# Patient Record
Sex: Male | Born: 1970 | Race: White | Hispanic: No | Smoking: Current some day smoker
Health system: Southern US, Community
[De-identification: ages and names within clinical notes are randomized; demographics above are authoritative.]

## PROBLEM LIST (undated history)

## (undated) DIAGNOSIS — J189 Pneumonia, unspecified organism: Secondary | ICD-10-CM

## (undated) DIAGNOSIS — Z87442 Personal history of urinary calculi: Secondary | ICD-10-CM

## (undated) DIAGNOSIS — F329 Major depressive disorder, single episode, unspecified: Secondary | ICD-10-CM

## (undated) DIAGNOSIS — E119 Type 2 diabetes mellitus without complications: Secondary | ICD-10-CM

## (undated) DIAGNOSIS — K859 Acute pancreatitis without necrosis or infection, unspecified: Secondary | ICD-10-CM

## (undated) DIAGNOSIS — K219 Gastro-esophageal reflux disease without esophagitis: Secondary | ICD-10-CM

## (undated) DIAGNOSIS — G4730 Sleep apnea, unspecified: Secondary | ICD-10-CM

## (undated) DIAGNOSIS — F319 Bipolar disorder, unspecified: Secondary | ICD-10-CM

## (undated) DIAGNOSIS — F419 Anxiety disorder, unspecified: Secondary | ICD-10-CM

## (undated) DIAGNOSIS — E7800 Pure hypercholesterolemia, unspecified: Secondary | ICD-10-CM

## (undated) DIAGNOSIS — F32A Depression, unspecified: Secondary | ICD-10-CM

## (undated) DIAGNOSIS — F1020 Alcohol dependence, uncomplicated: Secondary | ICD-10-CM

## (undated) DIAGNOSIS — I10 Essential (primary) hypertension: Secondary | ICD-10-CM

## (undated) DIAGNOSIS — E1110 Type 2 diabetes mellitus with ketoacidosis without coma: Secondary | ICD-10-CM

## (undated) HISTORY — PX: LAPAROSCOPIC CHOLECYSTECTOMY: SUR755

## (undated) HISTORY — PX: CYSTOSCOPY W/ STONE MANIPULATION: SHX1427

---

## 2006-05-06 ENCOUNTER — Ambulatory Visit (HOSPITAL_COMMUNITY): Admission: RE | Admit: 2006-05-06 | Discharge: 2006-05-06 | Payer: Self-pay | Admitting: Cardiology

## 2006-07-20 ENCOUNTER — Ambulatory Visit: Payer: Self-pay | Admitting: Oncology

## 2006-08-30 ENCOUNTER — Ambulatory Visit: Payer: Self-pay | Admitting: Oncology

## 2009-05-31 ENCOUNTER — Emergency Department (HOSPITAL_BASED_OUTPATIENT_CLINIC_OR_DEPARTMENT_OTHER): Admission: EM | Admit: 2009-05-31 | Discharge: 2009-05-31 | Payer: Self-pay | Admitting: Emergency Medicine

## 2010-06-23 LAB — URINALYSIS, ROUTINE W REFLEX MICROSCOPIC
Ketones, ur: NEGATIVE mg/dL
Nitrite: NEGATIVE

## 2010-06-23 LAB — COMPREHENSIVE METABOLIC PANEL
ALT: 36 U/L (ref 0–53)
Alkaline Phosphatase: 69 U/L (ref 39–117)
Calcium: 9.5 mg/dL (ref 8.4–10.5)
Creatinine, Ser: 1 mg/dL (ref 0.4–1.5)

## 2010-06-23 LAB — CBC
HCT: 50.2 % (ref 39.0–52.0)
Hemoglobin: 17.6 g/dL — ABNORMAL HIGH (ref 13.0–17.0)
MCHC: 35.1 g/dL (ref 30.0–36.0)
RBC: 5.54 MIL/uL (ref 4.22–5.81)
RDW: 11.8 % (ref 11.5–15.5)
WBC: 13.4 10*3/uL — ABNORMAL HIGH (ref 4.0–10.5)

## 2010-06-23 LAB — POCT CARDIAC MARKERS: Myoglobin, poc: 61.1 ng/mL (ref 12–200)

## 2010-06-23 LAB — LIPASE, BLOOD: Lipase: 5217 U/L — ABNORMAL HIGH (ref 23–300)

## 2010-06-23 LAB — URINE MICROSCOPIC-ADD ON

## 2010-06-23 LAB — DIFFERENTIAL
Eosinophils Absolute: 0 10*3/uL (ref 0.0–0.7)
Lymphs Abs: 1.1 10*3/uL (ref 0.7–4.0)
Monocytes Absolute: 0.9 10*3/uL (ref 0.1–1.0)

## 2010-06-23 LAB — AMYLASE: Amylase: 481 U/L — ABNORMAL HIGH (ref 0–105)

## 2010-08-06 ENCOUNTER — Ambulatory Visit: Payer: Self-pay | Admitting: General Practice

## 2013-09-30 DIAGNOSIS — E039 Hypothyroidism, unspecified: Secondary | ICD-10-CM | POA: Insufficient documentation

## 2013-09-30 DIAGNOSIS — F32A Depression, unspecified: Secondary | ICD-10-CM | POA: Insufficient documentation

## 2013-09-30 DIAGNOSIS — E781 Pure hyperglyceridemia: Secondary | ICD-10-CM | POA: Insufficient documentation

## 2013-09-30 DIAGNOSIS — G4700 Insomnia, unspecified: Secondary | ICD-10-CM | POA: Insufficient documentation

## 2013-09-30 DIAGNOSIS — K811 Chronic cholecystitis: Secondary | ICD-10-CM | POA: Insufficient documentation

## 2013-09-30 DIAGNOSIS — K859 Acute pancreatitis without necrosis or infection, unspecified: Secondary | ICD-10-CM | POA: Insufficient documentation

## 2013-09-30 DIAGNOSIS — K863 Pseudocyst of pancreas: Secondary | ICD-10-CM | POA: Insufficient documentation

## 2013-09-30 DIAGNOSIS — Z794 Long term (current) use of insulin: Secondary | ICD-10-CM

## 2013-09-30 DIAGNOSIS — F419 Anxiety disorder, unspecified: Secondary | ICD-10-CM | POA: Insufficient documentation

## 2013-09-30 DIAGNOSIS — E1165 Type 2 diabetes mellitus with hyperglycemia: Secondary | ICD-10-CM

## 2013-09-30 DIAGNOSIS — F329 Major depressive disorder, single episode, unspecified: Secondary | ICD-10-CM | POA: Insufficient documentation

## 2013-09-30 DIAGNOSIS — IMO0002 Reserved for concepts with insufficient information to code with codable children: Secondary | ICD-10-CM | POA: Insufficient documentation

## 2013-09-30 DIAGNOSIS — E559 Vitamin D deficiency, unspecified: Secondary | ICD-10-CM | POA: Insufficient documentation

## 2013-09-30 DIAGNOSIS — J309 Allergic rhinitis, unspecified: Secondary | ICD-10-CM | POA: Insufficient documentation

## 2013-09-30 DIAGNOSIS — G4733 Obstructive sleep apnea (adult) (pediatric): Secondary | ICD-10-CM | POA: Insufficient documentation

## 2013-09-30 DIAGNOSIS — E7800 Pure hypercholesterolemia, unspecified: Secondary | ICD-10-CM | POA: Insufficient documentation

## 2013-09-30 DIAGNOSIS — E1142 Type 2 diabetes mellitus with diabetic polyneuropathy: Secondary | ICD-10-CM | POA: Insufficient documentation

## 2013-09-30 DIAGNOSIS — I10 Essential (primary) hypertension: Secondary | ICD-10-CM | POA: Insufficient documentation

## 2013-09-30 DIAGNOSIS — R7989 Other specified abnormal findings of blood chemistry: Secondary | ICD-10-CM | POA: Insufficient documentation

## 2013-09-30 DIAGNOSIS — F1011 Alcohol abuse, in remission: Secondary | ICD-10-CM | POA: Insufficient documentation

## 2013-11-21 DIAGNOSIS — K760 Fatty (change of) liver, not elsewhere classified: Secondary | ICD-10-CM | POA: Insufficient documentation

## 2015-05-22 DIAGNOSIS — F331 Major depressive disorder, recurrent, moderate: Secondary | ICD-10-CM | POA: Insufficient documentation

## 2015-05-22 DIAGNOSIS — F410 Panic disorder [episodic paroxysmal anxiety] without agoraphobia: Secondary | ICD-10-CM | POA: Insufficient documentation

## 2015-11-25 ENCOUNTER — Other Ambulatory Visit: Payer: Self-pay | Admitting: Family Medicine

## 2015-11-26 LAB — CMP12+LP+TP+TSH+6AC+PSA+CBC…
ALBUMIN: 4 g/dL (ref 3.5–5.5)
ALT: 57 IU/L — AB (ref 0–44)
AST: 129 IU/L — AB (ref 0–40)
Albumin/Globulin Ratio: 1.6 (ref 1.2–2.2)
Alkaline Phosphatase: 151 IU/L — ABNORMAL HIGH (ref 39–117)
BASOS ABS: 0 10*3/uL (ref 0.0–0.2)
BILIRUBIN TOTAL: 0.9 mg/dL (ref 0.0–1.2)
BUN / CREAT RATIO: 7 — AB (ref 9–20)
BUN: 6 mg/dL (ref 6–24)
Basos: 0 %
CHLORIDE: 79 mmol/L — AB (ref 96–106)
CHOLESTEROL TOTAL: 184 mg/dL (ref 100–199)
CREATININE: 0.82 mg/dL (ref 0.76–1.27)
Calcium: 8.2 mg/dL — ABNORMAL LOW (ref 8.7–10.2)
Chol/HDL Ratio: 5 ratio units (ref 0.0–5.0)
EOS (ABSOLUTE): 0 10*3/uL (ref 0.0–0.4)
ESTIMATED CHD RISK: 1 times avg. (ref 0.0–1.0)
Eos: 0 %
FREE THYROXINE INDEX: 1.3 (ref 1.2–4.9)
GFR, EST AFRICAN AMERICAN: 122 mL/min/{1.73_m2} (ref >59)
GFR, EST NON AFRICAN AMERICAN: 105 mL/min/{1.73_m2} (ref >59)
GGT: 1051 IU/L — AB (ref 0–65)
GLUCOSE: 792 mg/dL — AB (ref 65–99)
Globulin, Total: 2.5 g/dL (ref 1.5–4.5)
HDL: 37 mg/dL — ABNORMAL LOW (ref >39)
Hematocrit: 45.4 % (ref 37.5–51.0)
Hemoglobin: 15.2 g/dL (ref 12.6–17.7)
IRON: 158 ug/dL (ref 38–169)
Immature Grans (Abs): 0 10*3/uL (ref 0.0–0.1)
Immature Granulocytes: 0 %
LDH: 283 IU/L — ABNORMAL HIGH (ref 121–224)
LYMPHS ABS: 1 10*3/uL (ref 0.7–3.1)
Lymphs: 20 %
MCH: 33.9 pg — AB (ref 26.6–33.0)
MCHC: 33.5 g/dL (ref 31.5–35.7)
MCV: 101 fL — AB (ref 79–97)
MONOCYTES: 6 %
Monocytes Absolute: 0.3 10*3/uL (ref 0.1–0.9)
NEUTROS ABS: 3.6 10*3/uL (ref 1.4–7.0)
Neutrophils: 74 %
PHOSPHORUS: 3.9 mg/dL (ref 2.5–4.5)
PLATELETS: 124 10*3/uL — AB (ref 150–379)
POTASSIUM: 3.7 mmol/L (ref 3.5–5.2)
Prostate Specific Ag, Serum: 2.4 ng/mL (ref 0.0–4.0)
RBC: 4.48 x10E6/uL (ref 4.14–5.80)
RDW: 12.5 % (ref 12.3–15.4)
Sodium: 131 mmol/L — ABNORMAL LOW (ref 134–144)
T3 UPTAKE RATIO: 24 % (ref 24–39)
T4 TOTAL: 5.4 ug/dL (ref 4.5–12.0)
TOTAL PROTEIN: 6.5 g/dL (ref 6.0–8.5)
TSH: 2.44 u[IU]/mL (ref 0.450–4.500)
Triglycerides: 642 mg/dL (ref 0–149)
URIC ACID: 4.8 mg/dL (ref 3.7–8.6)
WBC: 5 10*3/uL (ref 3.4–10.8)

## 2015-11-26 LAB — HGB A1C W/O EAG: HEMOGLOBIN A1C: 12 % — AB (ref 4.8–5.6)

## 2016-05-21 DIAGNOSIS — G8929 Other chronic pain: Secondary | ICD-10-CM | POA: Insufficient documentation

## 2016-05-21 DIAGNOSIS — E1142 Type 2 diabetes mellitus with diabetic polyneuropathy: Secondary | ICD-10-CM | POA: Insufficient documentation

## 2016-05-21 DIAGNOSIS — M25562 Pain in left knee: Secondary | ICD-10-CM

## 2016-05-21 DIAGNOSIS — M25561 Pain in right knee: Secondary | ICD-10-CM

## 2016-05-27 DIAGNOSIS — R739 Hyperglycemia, unspecified: Secondary | ICD-10-CM | POA: Insufficient documentation

## 2016-05-27 DIAGNOSIS — R202 Paresthesia of skin: Secondary | ICD-10-CM

## 2016-05-27 DIAGNOSIS — R200 Anesthesia of skin: Secondary | ICD-10-CM | POA: Insufficient documentation

## 2016-05-27 DIAGNOSIS — R262 Difficulty in walking, not elsewhere classified: Secondary | ICD-10-CM | POA: Insufficient documentation

## 2016-05-28 DIAGNOSIS — G959 Disease of spinal cord, unspecified: Secondary | ICD-10-CM | POA: Insufficient documentation

## 2016-06-04 ENCOUNTER — Other Ambulatory Visit: Payer: Self-pay | Admitting: Registered Nurse

## 2016-06-04 ENCOUNTER — Encounter: Payer: Self-pay | Admitting: Registered Nurse

## 2016-07-23 ENCOUNTER — Ambulatory Visit: Payer: Self-pay | Admitting: *Deleted

## 2016-07-23 VITALS — BP 106/78 | HR 101

## 2016-07-23 DIAGNOSIS — E118 Type 2 diabetes mellitus with unspecified complications: Principal | ICD-10-CM

## 2016-07-23 DIAGNOSIS — Z794 Long term (current) use of insulin: Principal | ICD-10-CM

## 2016-07-23 DIAGNOSIS — E1165 Type 2 diabetes mellitus with hyperglycemia: Secondary | ICD-10-CM

## 2016-07-23 DIAGNOSIS — IMO0002 Reserved for concepts with insufficient information to code with codable children: Secondary | ICD-10-CM

## 2016-07-23 NOTE — Progress Notes (Signed)
Pt presented to clinic reporting he was pushed to come by coworkers that felt he was walking off balance. Pt reports he has felt dizzy, worse with bending over, or getting in and out of the shower, for past few weeks. Then sts"really almost since I got out of the hospital" d/c date was 05/30/16. Sts he has also had blurred vision and n/v intermittently. Every few days will have multiple episodes of vomiting. Unable to report what his CBG is at these times because "my hands are too shaky to check." blood sugar 150 at work yesterday afternoon where he reports "I think I passed out at my desk at that time. I came to with my head hitting my keyboard." No witnesses, unknown duration of LOC. This am was 374 and wants RN to check glucometer for accuracy due to this variance. CBG checked with his monitor and clinic monitor from same sample site back to back. 249 on clinic, 270 on pt's. Pt reports usually asymptomatic when CBGs are in 200's. BP WNL. Pulse slightly elevated but regular, +2. Encouraged pt to call his MD office to discuss these symptoms and develop and plan of care. Pt is agreeable to plan.

## 2016-08-27 ENCOUNTER — Encounter (HOSPITAL_COMMUNITY): Payer: Self-pay

## 2016-08-27 ENCOUNTER — Emergency Department (HOSPITAL_COMMUNITY): Payer: No Typology Code available for payment source

## 2016-08-27 ENCOUNTER — Emergency Department (HOSPITAL_COMMUNITY)
Admission: EM | Admit: 2016-08-27 | Discharge: 2016-08-27 | Disposition: A | Discharge status: Home / Self Care | Payer: No Typology Code available for payment source | Attending: Emergency Medicine | Admitting: Emergency Medicine

## 2016-08-27 DIAGNOSIS — E039 Hypothyroidism, unspecified: Secondary | ICD-10-CM | POA: Diagnosis not present

## 2016-08-27 DIAGNOSIS — Z794 Long term (current) use of insulin: Secondary | ICD-10-CM | POA: Diagnosis not present

## 2016-08-27 DIAGNOSIS — I10 Essential (primary) hypertension: Secondary | ICD-10-CM | POA: Diagnosis not present

## 2016-08-27 DIAGNOSIS — F17200 Nicotine dependence, unspecified, uncomplicated: Secondary | ICD-10-CM | POA: Diagnosis not present

## 2016-08-27 DIAGNOSIS — E1140 Type 2 diabetes mellitus with diabetic neuropathy, unspecified: Secondary | ICD-10-CM | POA: Diagnosis present

## 2016-08-27 DIAGNOSIS — R109 Unspecified abdominal pain: Secondary | ICD-10-CM | POA: Insufficient documentation

## 2016-08-27 DIAGNOSIS — R42 Dizziness and giddiness: Secondary | ICD-10-CM | POA: Insufficient documentation

## 2016-08-27 DIAGNOSIS — G629 Polyneuropathy, unspecified: Secondary | ICD-10-CM

## 2016-08-27 LAB — BASIC METABOLIC PANEL
Anion gap: 20 — ABNORMAL HIGH (ref 5–15)
CALCIUM: 8.7 mg/dL — AB (ref 8.9–10.3)
CO2: 27 mmol/L (ref 22–32)
CREATININE: 0.72 mg/dL (ref 0.61–1.24)
Chloride: 94 mmol/L — ABNORMAL LOW (ref 101–111)
GFR calc Af Amer: >60 mL/min (ref >60)
GFR calc non Af Amer: >60 mL/min (ref >60)
GLUCOSE: 370 mg/dL — AB (ref 65–99)
Potassium: 2.9 mmol/L — ABNORMAL LOW (ref 3.5–5.1)
Sodium: 141 mmol/L (ref 135–145)

## 2016-08-27 LAB — URINALYSIS, MICROSCOPIC (REFLEX)

## 2016-08-27 LAB — URINALYSIS, ROUTINE W REFLEX MICROSCOPIC
Glucose, UA: >=500 mg/dL — AB
Ketones, ur: 15 mg/dL — AB
Leukocytes, UA: NEGATIVE
Nitrite: NEGATIVE
PROTEIN: 100 mg/dL — AB
pH: 6 (ref 5.0–8.0)

## 2016-08-27 LAB — HEPATIC FUNCTION PANEL
ALK PHOS: 96 U/L (ref 38–126)
ALT: 56 U/L (ref 17–63)
AST: 113 U/L — ABNORMAL HIGH (ref 15–41)
Albumin: 4.1 g/dL (ref 3.5–5.0)
BILIRUBIN INDIRECT: 0.5 mg/dL (ref 0.3–0.9)
Bilirubin, Direct: 0.3 mg/dL (ref 0.1–0.5)
Total Bilirubin: 0.8 mg/dL (ref 0.3–1.2)
Total Protein: 7 g/dL (ref 6.5–8.1)

## 2016-08-27 LAB — CBG MONITORING, ED
GLUCOSE-CAPILLARY: 350 mg/dL — AB (ref 65–99)
Glucose-Capillary: 279 mg/dL — ABNORMAL HIGH (ref 65–99)

## 2016-08-27 LAB — CBC
HCT: 44.5 % (ref 39.0–52.0)
Hemoglobin: 16.1 g/dL (ref 13.0–17.0)
MCH: 32.3 pg (ref 26.0–34.0)
MCHC: 36.2 g/dL — AB (ref 30.0–36.0)
MCV: 89.4 fL (ref 78.0–100.0)
PLATELETS: 130 10*3/uL — AB (ref 150–400)
RBC: 4.98 MIL/uL (ref 4.22–5.81)
RDW: 11.9 % (ref 11.5–15.5)
WBC: 5.2 10*3/uL (ref 4.0–10.5)

## 2016-08-27 MED ORDER — POTASSIUM CHLORIDE CRYS ER 20 MEQ PO TBCR
40.0000 meq | EXTENDED_RELEASE_TABLET | Freq: Once | ORAL | Status: AC
Start: 2016-08-27 — End: 2016-08-27
  Administered 2016-08-27: 40 meq via ORAL
  Filled 2016-08-27: qty 2

## 2016-08-27 MED ORDER — LORAZEPAM 0.5 MG PO TABS: 1.0000 mg | ORAL_TABLET | Freq: Three times a day (TID) | ORAL | 0 refills | Status: DC | PRN

## 2016-08-27 MED ORDER — HYDROMORPHONE HCL 1 MG/ML IJ SOLN
1.0000 mg | Freq: Once | INTRAMUSCULAR | Status: AC
  Administered 2016-08-27: 1 mg via INTRAVENOUS
  Filled 2016-08-27: qty 1

## 2016-08-27 MED ORDER — SODIUM CHLORIDE 0.9 % IV BOLUS (SEPSIS)
1000.0000 mL | Freq: Once | INTRAVENOUS | Status: AC
  Administered 2016-08-27: 1000 mL via INTRAVENOUS

## 2016-08-27 MED ORDER — POTASSIUM CHLORIDE ER 10 MEQ PO TBCR: 10.0000 meq | EXTENDED_RELEASE_TABLET | Freq: Every day | ORAL | 0 refills | Status: DC

## 2016-08-27 MED ORDER — ONDANSETRON HCL 4 MG/2ML IJ SOLN
4.0000 mg | Freq: Once | INTRAMUSCULAR | Status: AC
  Administered 2016-08-27: 4 mg via INTRAVENOUS
  Filled 2016-08-27: qty 2

## 2016-08-27 MED ORDER — POTASSIUM CHLORIDE 10 MEQ/100ML IV SOLN
10.0000 meq | Freq: Once | INTRAVENOUS | Status: AC
  Administered 2016-08-27: 10 meq via INTRAVENOUS
  Filled 2016-08-27: qty 100

## 2016-08-27 MED ORDER — LORAZEPAM 0.5 MG PO TABS: 0.5000 mg | ORAL_TABLET | Freq: Three times a day (TID) | ORAL | 0 refills | Status: DC | PRN

## 2016-08-27 NOTE — ED Provider Notes (Signed)
[facility]-EMERGENCY DEPT Provider Note   CSN: [id number] Arrival date & time: 08/27/16  4098     History   Chief Complaint Chief Complaint  Patient presents with  . Peripheral Neuropathy    HPI ADEM COSTLOW is a 46 y.o. male.  Patient states that he's had periodic shaking recently.    Illness  This is a new problem. The current episode started more than 2 days ago. The problem occurs daily. The problem has not changed since onset.Pertinent negatives include no chest pain, no abdominal pain and no headaches. Nothing aggravates the symptoms. Nothing relieves the symptoms. He has tried nothing for the symptoms. The treatment provided no relief.    Past Medical History:  Diagnosis Date  . Diabetes mellitus without complication [facility])     Patient Active Problem List   Diagnosis Date Noted  . Moderate episode of recurrent major depressive disorder (HCC) 05/22/2015  . Panic disorder 05/22/2015  . Hepatic steatosis 11/21/2013  . Nondependent alcohol abuse, in remission 09/30/2013  . Allergic rhinitis 09/30/2013  . Anxiety disorder 09/30/2013  . Chronic cholecystitis 09/30/2013  . Disorder of magnesium metabolism 09/30/2013  . Essential hypertension 09/30/2013  . Familial hypertriglyceridemia 09/30/2013  . Hypercholesterolemia 09/30/2013  . Hypothyroidism 09/30/2013  . Insomnia 09/30/2013  . Low testosterone 09/30/2013  . OSA (obstructive sleep apnea) 09/30/2013  . Pancreatic pseudocyst 09/30/2013  . Depressive disorder 09/30/2013  . Recurrent pancreatitis (HCC) 09/30/2013  . Uncontrolled type 2 diabetes mellitus without complication, with long-term current use of insulin (HCC) 09/30/2013  . Vitamin D deficiency 09/30/2013    History reviewed. No pertinent surgical history.     Home Medications    Prior to Admission medications   Medication Sig Start Date End Date Taking? Authorizing Provider  atorvastatin (LIPITOR) 10 MG tablet Take 10 mg by mouth daily. 01/23/14   Yes [provider]  Blood Glucose Monitoring Suppl (GLUCOCOM BLOOD GLUCOSE MONITOR) DEVI Apply 1 strip topically 2 (two) times daily as needed. 11/21/13  Yes [provider]  canagliflozin (INVOKANA) 100 MG TABS tablet Take 100 mg by mouth daily. 08/20/14  Yes [provider]  carvedilol (COREG) 12.5 MG tablet Take 12.5 mg by mouth 2 (two) times daily. 07/11/15  Yes [provider]  clonazePAM (KLONOPIN) 1 MG tablet Take 1 mg by mouth 2 (two) times daily as needed. 12/30/15 12/29/16 Yes [provider]  DULoxetine (CYMBALTA) 30 MG capsule Take 30 mg by mouth 2 (two) times daily. 01/17/16 01/16/17 Yes [provider]  fenofibrate 160 MG tablet Take 160 mg by mouth. 06/04/16  Yes [provider]  gabapentin (NEURONTIN) 300 MG capsule Take 600 mg by mouth 3 (three) times daily.  05/30/16  Yes [provider]  glipiZIDE (GLUCOTROL) 10 MG tablet Take 10 mg by mouth. 05/30/16  Yes [provider]  glucose blood (BAYER CONTOUR TEST) test strip Apply 1 strip topically 3 (three) times daily as needed. 04/10/14  Yes [provider]  hydrOXYzine (VISTARIL) 50 MG capsule Take 50 mg by mouth 2 (two) times daily as needed. 07/11/15  Yes [provider]  Insulin Glargine (LANTUS SOLOSTAR) 100 UNIT/ML Solostar Pen Inject 38 units subcutaneously daily or as directed for diabetes E11.9 06/11/16  Yes [provider]  levothyroxine (SYNTHROID, LEVOTHROID) 112 MCG tablet Take 112 mcg by mouth daily. 05/31/15  Yes [provider]  magnesium oxide (MAG-OX) 400 MG tablet Take 400 mg by mouth daily. Prescribed yesterday have not picked it up yet 08/17/16  08/17/17 Yes [provider]  omeprazole (PRILOSEC) 20 MG capsule Take 20 mg by mouth daily. 07/23/14  Yes [provider]  ondansetron (ZOFRAN) 4 MG tablet Take 4 mg by mouth every 6 (six) hours as needed. 07/23/14  Yes [provider]  traZODone  (DESYREL) 50 MG tablet Take 50 mg by mouth at bedtime as needed.  07/11/15  Yes [provider]  LORazepam (ATIVAN) 0.5 MG tablet Take 2 tablets (1 mg total) by mouth every 8 (eight) hours as needed (shaking). 08/27/16   Bethann Berkshire, MD  LORazepam (ATIVAN) 0.5 MG tablet Take 1 tablet (0.5 mg total) by mouth every 8 (eight) hours as needed (shaking). 08/27/16   Bethann Berkshire, MD  potassium chloride (K-DUR) 10 MEQ tablet Take 1 tablet (10 mEq total) by mouth daily. 08/27/16   Bethann Berkshire, MD    Family History History reviewed. No pertinent family history.  Social History Social History  Substance Use Topics  . Smoking status: Current Some Day Smoker  . Smokeless tobacco: Never Used  . Alcohol use No     Allergies   Morphine; Penicillins; Amoxicillin; and Meperidine   Review of Systems Review of Systems  Constitutional: Negative for appetite change and fatigue.  HENT: Negative for congestion, ear discharge and sinus pressure.   Eyes: Negative for discharge.  Respiratory: Negative for cough.   Cardiovascular: Negative for chest pain.  Gastrointestinal: Negative for abdominal pain and diarrhea.  Genitourinary: Negative for frequency and hematuria.  Musculoskeletal: Negative for back pain.  Skin: Negative for rash.  Neurological: Negative for seizures and headaches.       Tremor  Psychiatric/Behavioral: Negative for hallucinations.     Physical Exam Updated Vital Signs BP (!) 157/98   Pulse 88   Temp 98.2 F (36.8 C) (Oral)   Resp 16   Ht 5\' 7"  (1.702 m)   Wt 63.5 kg (140 lb)   SpO2 90%   BMI 21.93 kg/m   Physical Exam  Constitutional: He is oriented to person, place, and time. He appears well-developed.  HENT:  Head: Normocephalic.  Eyes: Conjunctivae and EOM are normal. No scleral icterus.  Neck: Neck supple. No thyromegaly present.  Cardiovascular: Normal rate and regular rhythm.  Exam reveals no gallop and no friction rub.   No murmur  heard. Pulmonary/Chest: No stridor. He has no wheezes. He has no rales. He exhibits no tenderness.  Abdominal: He exhibits no distension. There is no tenderness. There is no rebound.  Musculoskeletal: Normal range of motion. He exhibits no edema.  Lymphadenopathy:    He has no cervical adenopathy.  Neurological: He is oriented to person, place, and time. He exhibits normal muscle tone. Coordination normal.  Mild tremor.  Skin: No rash noted. No erythema.  Psychiatric: He has a normal mood and affect. His behavior is normal.     ED Treatments / Results  Labs (all labs ordered are listed, but only abnormal results are displayed) Labs Reviewed  BASIC METABOLIC PANEL - Abnormal; Notable for the following:       Result Value   Potassium 2.9 (*)    Chloride 94 (*)    Glucose, Bld 370 (*)    BUN <5 (*)    Calcium 8.7 (*)    Anion gap 20 (*)    All other components within normal limits  CBC - Abnormal; Notable for the following:    MCHC 36.2 (*)    Platelets 130 (*)    All other components within  normal limits  URINALYSIS, ROUTINE W REFLEX MICROSCOPIC - Abnormal; Notable for the following:    APPearance CLOUDY (*)    Specific Gravity, Urine >1.030 (*)    Glucose, UA >=500 (*)    Hgb urine dipstick LARGE (*)    Bilirubin Urine SMALL (*)    Ketones, ur 15 (*)    Protein, ur 100 (*)    All other components within normal limits  URINALYSIS, MICROSCOPIC (REFLEX) - Abnormal; Notable for the following:    Bacteria, UA FEW (*)    Squamous Epithelial / LPF 0-5 (*)    All other components within normal limits  HEPATIC FUNCTION PANEL - Abnormal; Notable for the following:    AST 113 (*)    All other components within normal limits  CBG MONITORING, ED - Abnormal; Notable for the following:    Glucose-Capillary 350 (*)    All other components within normal limits  CBG MONITORING, ED - Abnormal; Notable for the following:    Glucose-Capillary 279 (*)    All other components within normal  limits    EKG  EKG Interpretation None       Radiology Ct Head Wo Contrast  Result Date: 08/27/2016 CLINICAL DATA:  Weakness and dizziness today. EXAM: CT HEAD WITHOUT CONTRAST TECHNIQUE: Contiguous axial images were obtained from the base of the skull through the vertex without intravenous contrast. COMPARISON:  Head CT scan 05/27/2016. FINDINGS: Brain: No acute abnormality including hemorrhage, infarct, mass lesion, mass effect, midline shift or abnormal extra-axial fluid collection. No hydrocephalus or pneumocephalus. Vascular: Negative. Skull: Intact. Sinuses/Orbits: Negative. Other: None. IMPRESSION: Negative head CT. Electronically Signed   By: Drusilla Kannerhomas  Dalessio M.D.   On: 08/27/2016 13:13   Ct Renal Stone Study  Result Date: 08/27/2016 CLINICAL DATA:  Bilateral flank pain today.  No known injury. EXAM: CT ABDOMEN AND PELVIS WITHOUT CONTRAST TECHNIQUE: Multidetector CT imaging of the abdomen and pelvis was performed following the standard protocol without IV contrast. COMPARISON:  CT abdomen and pelvis 07/21/2012. FINDINGS: Lower chest: Mild atelectasis or scar in the right lung base is noted. No pleural or pericardial effusion. Hepatobiliary: Diffuse marked hypoattenuation throughout the liver is consistent with fatty infiltration. Subtle nodularity of the liver border is suggestive cirrhosis. The gallbladder has been removed. Biliary tree is unremarkable. Pancreas: Unremarkable. No pancreatic ductal dilatation or surrounding inflammatory changes. Spleen: Normal in size without focal abnormality. Adrenals/Urinary Tract: Multiple nonobstructing right renal stones are identified. The largest is in the upper pole measuring 1.6 cm. 3-4 nonobstructing left renal stones are identified with the largest in the lower pole measuring 0.7 cm. No hydronephrosis on the right or left and no ureteral stones. Right renal cyst measuring 3.5 cm in diameter is noted. Stomach/Bowel: A few scattered colonic  diverticula are seen without evidence of diverticulitis the stomach, small bowel and appendix appear normal. Vascular/Lymphatic: Calcific aortic and coronary atherosclerosis is identified. No lymphadenopathy. Reproductive: Prostate is unremarkable. Other: No fluid collection. Musculoskeletal: Negative. IMPRESSION: No acute abnormality abdomen or pelvis. Bilateral nonobstructing renal stones, more numerous on the right as described above. Fatty infiltration of the liver. Subtle nodularity of the liver border is suggestive of cirrhosis. Calcific aortic and coronary atherosclerosis. Mild diverticulosis without diverticulitis. Electronically Signed   By: Drusilla Kannerhomas  Dalessio M.D.   On: 08/27/2016 13:20    Procedures Procedures (including critical care time)  Medications Ordered in ED Medications  sodium chloride 0.9 % bolus 1,000 mL (0 mLs Intravenous Stopped 08/27/16 1308)  HYDROmorphone (DILAUDID) injection 1  mg (1 mg Intravenous Given 08/27/16 1200)  ondansetron (ZOFRAN) injection 4 mg (4 mg Intravenous Given 08/27/16 1159)  potassium chloride SA (K-DUR,KLOR-CON) CR tablet 40 mEq (40 mEq Oral Given 08/27/16 1159)  potassium chloride 10 mEq in 100 mL IVPB (0 mEq Intravenous Stopped 08/27/16 1259)     Initial Impression / Assessment and Plan / ED Course  I have reviewed the triage vital signs and the nursing notes.  Pertinent labs & imaging results that were available during my care of the patient were reviewed by me and considered in my medical decision making (see chart for details).     Patient with hypokalemia and poorly controlled glucose along with hematuria. The tremors could be related to his poorly controlled sugar is hypokalemia. He will be sent home on some potassium and will follow-up with his PCP. Also the hematuria could be related to his kidney stones he has in his kidney. He'll follow-up with urology  Final Clinical Impressions(s) / ED Diagnoses   Final diagnoses:  Neuropathy     New Prescriptions New Prescriptions   LORAZEPAM (ATIVAN) 0.5 MG TABLET    Take 2 tablets (1 mg total) by mouth every 8 (eight) hours as needed (shaking).   LORAZEPAM (ATIVAN) 0.5 MG TABLET    Take 1 tablet (0.5 mg total) by mouth every 8 (eight) hours as needed (shaking).   POTASSIUM CHLORIDE (K-DUR) 10 MEQ TABLET    Take 1 tablet (10 mEq total) by mouth daily.     Bethann Berkshire, MD 08/27/16 [phone]

## 2016-08-27 NOTE — Discharge Instructions (Signed)
Follow-up with her doctor next week for recheck.  Follow-up with [facility] in 2-3 weeks for your blood in your urine

## 2016-08-27 NOTE — ED Triage Notes (Signed)
Pt states he has had tingling in his hands and feet X2 days. Pt reports hx of diabetic neuropathy. Pt also noted to have sudden jerking motions. Pt alert and oriented, ambulatory.

## 2016-08-27 NOTE — ED Notes (Signed)
Pt CBG was 350, notified Lori(RN)

## 2016-08-27 NOTE — ED Notes (Signed)
Pt states he was seen in the ED at [facility]- for same symptoms as he is having today-- pt c/o twitching, and body jerking uncontrollably, and numbness from knees down and hands-- has had MRI in  March for same.

## 2016-08-27 NOTE — ED Notes (Signed)
Patient transported to CT 

## 2016-12-04 ENCOUNTER — Encounter (HOSPITAL_COMMUNITY): Payer: Self-pay | Admitting: Emergency Medicine

## 2016-12-04 ENCOUNTER — Inpatient Hospital Stay (HOSPITAL_COMMUNITY)
Admission: EM | Admit: 2016-12-04 | Discharge: 2016-12-09 | DRG: 638 | Disposition: A | Discharge status: Home / Self Care | Payer: No Typology Code available for payment source | Attending: Internal Medicine | Admitting: Internal Medicine

## 2016-12-04 DIAGNOSIS — I10 Essential (primary) hypertension: Secondary | ICD-10-CM | POA: Diagnosis present

## 2016-12-04 DIAGNOSIS — E785 Hyperlipidemia, unspecified: Secondary | ICD-10-CM | POA: Diagnosis present

## 2016-12-04 DIAGNOSIS — E1143 Type 2 diabetes mellitus with diabetic autonomic (poly)neuropathy: Secondary | ICD-10-CM | POA: Diagnosis present

## 2016-12-04 DIAGNOSIS — Z79899 Other long term (current) drug therapy: Secondary | ICD-10-CM

## 2016-12-04 DIAGNOSIS — F1020 Alcohol dependence, uncomplicated: Secondary | ICD-10-CM

## 2016-12-04 DIAGNOSIS — E1110 Type 2 diabetes mellitus with ketoacidosis without coma: Secondary | ICD-10-CM | POA: Diagnosis not present

## 2016-12-04 DIAGNOSIS — E876 Hypokalemia: Secondary | ICD-10-CM | POA: Diagnosis present

## 2016-12-04 DIAGNOSIS — E1140 Type 2 diabetes mellitus with diabetic neuropathy, unspecified: Secondary | ICD-10-CM | POA: Diagnosis present

## 2016-12-04 DIAGNOSIS — N179 Acute kidney failure, unspecified: Secondary | ICD-10-CM | POA: Diagnosis present

## 2016-12-04 DIAGNOSIS — E1310 Other specified diabetes mellitus with ketoacidosis without coma: Secondary | ICD-10-CM

## 2016-12-04 DIAGNOSIS — Z794 Long term (current) use of insulin: Secondary | ICD-10-CM

## 2016-12-04 DIAGNOSIS — M546 Pain in thoracic spine: Secondary | ICD-10-CM | POA: Diagnosis present

## 2016-12-04 DIAGNOSIS — F1021 Alcohol dependence, in remission: Secondary | ICD-10-CM | POA: Diagnosis present

## 2016-12-04 DIAGNOSIS — E1165 Type 2 diabetes mellitus with hyperglycemia: Secondary | ICD-10-CM

## 2016-12-04 DIAGNOSIS — D696 Thrombocytopenia, unspecified: Secondary | ICD-10-CM | POA: Diagnosis present

## 2016-12-04 DIAGNOSIS — R112 Nausea with vomiting, unspecified: Secondary | ICD-10-CM | POA: Diagnosis present

## 2016-12-04 DIAGNOSIS — IMO0001 Reserved for inherently not codable concepts without codable children: Secondary | ICD-10-CM

## 2016-12-04 DIAGNOSIS — F17200 Nicotine dependence, unspecified, uncomplicated: Secondary | ICD-10-CM | POA: Diagnosis present

## 2016-12-04 DIAGNOSIS — Z885 Allergy status to narcotic agent status: Secondary | ICD-10-CM

## 2016-12-04 DIAGNOSIS — E119 Type 2 diabetes mellitus without complications: Secondary | ICD-10-CM

## 2016-12-04 DIAGNOSIS — Z23 Encounter for immunization: Secondary | ICD-10-CM

## 2016-12-04 DIAGNOSIS — E871 Hypo-osmolality and hyponatremia: Secondary | ICD-10-CM | POA: Diagnosis present

## 2016-12-04 DIAGNOSIS — F418 Other specified anxiety disorders: Secondary | ICD-10-CM | POA: Diagnosis present

## 2016-12-04 DIAGNOSIS — K219 Gastro-esophageal reflux disease without esophagitis: Secondary | ICD-10-CM | POA: Diagnosis present

## 2016-12-04 DIAGNOSIS — Z880 Allergy status to penicillin: Secondary | ICD-10-CM

## 2016-12-04 DIAGNOSIS — K3184 Gastroparesis: Secondary | ICD-10-CM | POA: Diagnosis present

## 2016-12-04 DIAGNOSIS — G8929 Other chronic pain: Secondary | ICD-10-CM | POA: Diagnosis present

## 2016-12-04 DIAGNOSIS — Z881 Allergy status to other antibiotic agents status: Secondary | ICD-10-CM

## 2016-12-04 DIAGNOSIS — A084 Viral intestinal infection, unspecified: Secondary | ICD-10-CM | POA: Diagnosis present

## 2016-12-04 DIAGNOSIS — Z888 Allergy status to other drugs, medicaments and biological substances status: Secondary | ICD-10-CM

## 2016-12-04 DIAGNOSIS — E860 Dehydration: Secondary | ICD-10-CM | POA: Diagnosis present

## 2016-12-04 DIAGNOSIS — E039 Hypothyroidism, unspecified: Secondary | ICD-10-CM | POA: Diagnosis present

## 2016-12-04 DIAGNOSIS — R6510 Systemic inflammatory response syndrome (SIRS) of non-infectious origin without acute organ dysfunction: Secondary | ICD-10-CM | POA: Diagnosis present

## 2016-12-04 DIAGNOSIS — R197 Diarrhea, unspecified: Secondary | ICD-10-CM

## 2016-12-04 DIAGNOSIS — M549 Dorsalgia, unspecified: Secondary | ICD-10-CM

## 2016-12-04 DIAGNOSIS — E1142 Type 2 diabetes mellitus with diabetic polyneuropathy: Secondary | ICD-10-CM

## 2016-12-04 DIAGNOSIS — IMO0002 Reserved for concepts with insufficient information to code with codable children: Secondary | ICD-10-CM

## 2016-12-04 HISTORY — DX: Alcohol dependence, uncomplicated: F10.20

## 2016-12-04 HISTORY — DX: Acute pancreatitis without necrosis or infection, unspecified: K85.90

## 2016-12-04 LAB — CBC
HCT: 48.7 % (ref 39.0–52.0)
Hemoglobin: 17.8 g/dL — ABNORMAL HIGH (ref 13.0–17.0)
MCH: 30.6 pg (ref 26.0–34.0)
MCHC: 36.6 g/dL — AB (ref 30.0–36.0)
MCV: 83.7 fL (ref 78.0–100.0)
PLATELETS: 212 10*3/uL (ref 150–400)
RBC: 5.82 MIL/uL — ABNORMAL HIGH (ref 4.22–5.81)
RDW: 12 % (ref 11.5–15.5)
WBC: 16.7 10*3/uL — ABNORMAL HIGH (ref 4.0–10.5)

## 2016-12-04 LAB — COMPREHENSIVE METABOLIC PANEL
ALK PHOS: 114 U/L (ref 38–126)
ALT: 51 U/L (ref 17–63)
AST: 34 U/L (ref 15–41)
Albumin: 4.3 g/dL (ref 3.5–5.0)
Anion gap: 36 — ABNORMAL HIGH (ref 5–15)
BILIRUBIN TOTAL: 2.6 mg/dL — AB (ref 0.3–1.2)
BUN: 23 mg/dL — AB (ref 6–20)
CALCIUM: 9.6 mg/dL (ref 8.9–10.3)
CO2: 10 mmol/L — ABNORMAL LOW (ref 22–32)
CREATININE: 1.97 mg/dL — AB (ref 0.61–1.24)
Chloride: 83 mmol/L — ABNORMAL LOW (ref 101–111)
GFR calc Af Amer: 45 mL/min — ABNORMAL LOW (ref >60)
GFR calc non Af Amer: 38 mL/min — ABNORMAL LOW (ref >60)
GLUCOSE: 494 mg/dL — AB (ref 65–99)
Potassium: 3.3 mmol/L — ABNORMAL LOW (ref 3.5–5.1)
Sodium: 129 mmol/L — ABNORMAL LOW (ref 135–145)
TOTAL PROTEIN: 7.7 g/dL (ref 6.5–8.1)

## 2016-12-04 LAB — LIPASE, BLOOD: Lipase: 18 U/L (ref 11–51)

## 2016-12-04 MED ORDER — ONDANSETRON HCL 4 MG/2ML IJ SOLN
4.0000 mg | Freq: Once | INTRAMUSCULAR | Status: AC | PRN
  Administered 2016-12-04: 4 mg via INTRAVENOUS

## 2016-12-04 NOTE — ED Triage Notes (Signed)
Reports N/V/D for two days with epigastric burning.  Hx of pancreatitis.  Reports it feels the same.  Pain radiating into upper back.  Actively vomiting in triage.  Last drink June 15th.

## 2016-12-04 NOTE — ED Notes (Signed)
Contact person is Amy [phone]

## 2016-12-05 ENCOUNTER — Encounter (HOSPITAL_COMMUNITY): Payer: Self-pay | Admitting: Internal Medicine

## 2016-12-05 DIAGNOSIS — D696 Thrombocytopenia, unspecified: Secondary | ICD-10-CM | POA: Diagnosis present

## 2016-12-05 DIAGNOSIS — R6510 Systemic inflammatory response syndrome (SIRS) of non-infectious origin without acute organ dysfunction: Secondary | ICD-10-CM | POA: Diagnosis present

## 2016-12-05 DIAGNOSIS — M549 Dorsalgia, unspecified: Secondary | ICD-10-CM

## 2016-12-05 DIAGNOSIS — E1310 Other specified diabetes mellitus with ketoacidosis without coma: Secondary | ICD-10-CM | POA: Diagnosis not present

## 2016-12-05 DIAGNOSIS — G8929 Other chronic pain: Secondary | ICD-10-CM | POA: Diagnosis present

## 2016-12-05 DIAGNOSIS — E1010 Type 1 diabetes mellitus with ketoacidosis without coma: Secondary | ICD-10-CM | POA: Diagnosis not present

## 2016-12-05 DIAGNOSIS — M546 Pain in thoracic spine: Secondary | ICD-10-CM | POA: Diagnosis present

## 2016-12-05 DIAGNOSIS — Z23 Encounter for immunization: Secondary | ICD-10-CM | POA: Diagnosis not present

## 2016-12-05 DIAGNOSIS — R197 Diarrhea, unspecified: Secondary | ICD-10-CM | POA: Diagnosis not present

## 2016-12-05 DIAGNOSIS — E1165 Type 2 diabetes mellitus with hyperglycemia: Secondary | ICD-10-CM | POA: Diagnosis not present

## 2016-12-05 DIAGNOSIS — I10 Essential (primary) hypertension: Secondary | ICD-10-CM | POA: Diagnosis not present

## 2016-12-05 DIAGNOSIS — E871 Hypo-osmolality and hyponatremia: Secondary | ICD-10-CM | POA: Diagnosis present

## 2016-12-05 DIAGNOSIS — Z881 Allergy status to other antibiotic agents status: Secondary | ICD-10-CM | POA: Diagnosis not present

## 2016-12-05 DIAGNOSIS — E1140 Type 2 diabetes mellitus with diabetic neuropathy, unspecified: Secondary | ICD-10-CM | POA: Diagnosis present

## 2016-12-05 DIAGNOSIS — R112 Nausea with vomiting, unspecified: Secondary | ICD-10-CM | POA: Diagnosis present

## 2016-12-05 DIAGNOSIS — E119 Type 2 diabetes mellitus without complications: Secondary | ICD-10-CM | POA: Diagnosis present

## 2016-12-05 DIAGNOSIS — F1020 Alcohol dependence, uncomplicated: Secondary | ICD-10-CM | POA: Diagnosis present

## 2016-12-05 DIAGNOSIS — F1021 Alcohol dependence, in remission: Secondary | ICD-10-CM | POA: Diagnosis present

## 2016-12-05 DIAGNOSIS — F418 Other specified anxiety disorders: Secondary | ICD-10-CM | POA: Diagnosis present

## 2016-12-05 DIAGNOSIS — E876 Hypokalemia: Secondary | ICD-10-CM | POA: Diagnosis not present

## 2016-12-05 DIAGNOSIS — E1110 Type 2 diabetes mellitus with ketoacidosis without coma: Secondary | ICD-10-CM | POA: Diagnosis present

## 2016-12-05 DIAGNOSIS — Z794 Long term (current) use of insulin: Secondary | ICD-10-CM | POA: Diagnosis not present

## 2016-12-05 DIAGNOSIS — K3184 Gastroparesis: Secondary | ICD-10-CM | POA: Diagnosis present

## 2016-12-05 DIAGNOSIS — K219 Gastro-esophageal reflux disease without esophagitis: Secondary | ICD-10-CM | POA: Diagnosis present

## 2016-12-05 DIAGNOSIS — N179 Acute kidney failure, unspecified: Secondary | ICD-10-CM | POA: Diagnosis present

## 2016-12-05 DIAGNOSIS — E1143 Type 2 diabetes mellitus with diabetic autonomic (poly)neuropathy: Secondary | ICD-10-CM | POA: Diagnosis present

## 2016-12-05 DIAGNOSIS — Z885 Allergy status to narcotic agent status: Secondary | ICD-10-CM | POA: Diagnosis not present

## 2016-12-05 DIAGNOSIS — A084 Viral intestinal infection, unspecified: Secondary | ICD-10-CM | POA: Diagnosis present

## 2016-12-05 DIAGNOSIS — E785 Hyperlipidemia, unspecified: Secondary | ICD-10-CM | POA: Diagnosis present

## 2016-12-05 DIAGNOSIS — E039 Hypothyroidism, unspecified: Secondary | ICD-10-CM | POA: Diagnosis present

## 2016-12-05 DIAGNOSIS — E860 Dehydration: Secondary | ICD-10-CM | POA: Diagnosis present

## 2016-12-05 LAB — I-STAT VENOUS BLOOD GAS, ED
ACID-BASE DEFICIT: 16 mmol/L — AB (ref 0.0–2.0)
ACID-BASE DEFICIT: 16 mmol/L — AB (ref 0.0–2.0)
Bicarbonate: 9.9 mmol/L — ABNORMAL LOW (ref 20.0–28.0)
Bicarbonate: 10.6 mmol/L — ABNORMAL LOW (ref 20.0–28.0)
O2 SAT: 24 %
O2 SAT: 15 %
PCO2 VEN: 27.6 mmHg — AB (ref 44.0–60.0)
PO2 VEN: 20 mmHg — AB (ref 32.0–45.0)
TCO2: 11 mmol/L — ABNORMAL LOW (ref 22–32)
TCO2: 11 mmol/L — AB (ref 22–32)
pCO2, Ven: 25.4 mmHg — ABNORMAL LOW (ref 44.0–60.0)
pH, Ven: 7.197 — CL (ref 7.250–7.430)
pH, Ven: 7.192 — CL (ref 7.250–7.430)
pO2, Ven: 16 mmHg — CL (ref 32.0–45.0)

## 2016-12-05 LAB — CBG MONITORING, ED
GLUCOSE-CAPILLARY: 318 mg/dL — AB (ref 65–99)
GLUCOSE-CAPILLARY: 174 mg/dL — AB (ref 65–99)
GLUCOSE-CAPILLARY: 170 mg/dL — AB (ref 65–99)
GLUCOSE-CAPILLARY: 157 mg/dL — AB (ref 65–99)
Glucose-Capillary: 146 mg/dL — ABNORMAL HIGH (ref 65–99)
Glucose-Capillary: 156 mg/dL — ABNORMAL HIGH (ref 65–99)
Glucose-Capillary: 173 mg/dL — ABNORMAL HIGH (ref 65–99)
Glucose-Capillary: 240 mg/dL — ABNORMAL HIGH (ref 65–99)
Glucose-Capillary: 389 mg/dL — ABNORMAL HIGH (ref 65–99)
Glucose-Capillary: 400 mg/dL — ABNORMAL HIGH (ref 65–99)
Glucose-Capillary: 493 mg/dL — ABNORMAL HIGH (ref 65–99)

## 2016-12-05 LAB — URINALYSIS, ROUTINE W REFLEX MICROSCOPIC
BILIRUBIN URINE: NEGATIVE
Bacteria, UA: NONE SEEN
Glucose, UA: >=500 mg/dL — AB
Ketones, ur: 80 mg/dL — AB
Leukocytes, UA: NEGATIVE
NITRITE: NEGATIVE
PH: 5 (ref 5.0–8.0)
Protein, ur: 30 mg/dL — AB
RBC / HPF: NONE SEEN RBC/hpf (ref 0–5)
SPECIFIC GRAVITY, URINE: 1.014 (ref 1.005–1.030)
Squamous Epithelial / LPF: NONE SEEN

## 2016-12-05 LAB — BASIC METABOLIC PANEL
ANION GAP: 18 — AB (ref 5–15)
ANION GAP: 11 (ref 5–15)
ANION GAP: 9 (ref 5–15)
Anion gap: 28 — ABNORMAL HIGH (ref 5–15)
BUN: 23 mg/dL — ABNORMAL HIGH (ref 6–20)
BUN: 17 mg/dL (ref 6–20)
BUN: 14 mg/dL (ref 6–20)
BUN: 11 mg/dL (ref 6–20)
CALCIUM: 8.4 mg/dL — AB (ref 8.9–10.3)
CALCIUM: 8 mg/dL — AB (ref 8.9–10.3)
CALCIUM: 8 mg/dL — AB (ref 8.9–10.3)
CALCIUM: 8.1 mg/dL — AB (ref 8.9–10.3)
CHLORIDE: 105 mmol/L (ref 101–111)
CO2: 11 mmol/L — AB (ref 22–32)
CO2: 13 mmol/L — AB (ref 22–32)
CO2: 18 mmol/L — AB (ref 22–32)
CO2: 20 mmol/L — AB (ref 22–32)
CREATININE: 1.79 mg/dL — AB (ref 0.61–1.24)
CREATININE: 1.42 mg/dL — AB (ref 0.61–1.24)
Chloride: 94 mmol/L — ABNORMAL LOW (ref 101–111)
Chloride: 100 mmol/L — ABNORMAL LOW (ref 101–111)
Chloride: 103 mmol/L (ref 101–111)
Creatinine, Ser: 1.1 mg/dL (ref 0.61–1.24)
Creatinine, Ser: 0.83 mg/dL (ref 0.61–1.24)
GFR calc Af Amer: 50 mL/min — ABNORMAL LOW (ref >60)
GFR calc Af Amer: >60 mL/min (ref >60)
GFR calc Af Amer: >60 mL/min (ref >60)
GFR calc Af Amer: >60 mL/min (ref >60)
GFR calc non Af Amer: 43 mL/min — ABNORMAL LOW (ref >60)
GFR calc non Af Amer: 57 mL/min — ABNORMAL LOW (ref >60)
GFR calc non Af Amer: >60 mL/min (ref >60)
GFR calc non Af Amer: >60 mL/min (ref >60)
GLUCOSE: 397 mg/dL — AB (ref 65–99)
GLUCOSE: 213 mg/dL — AB (ref 65–99)
GLUCOSE: 168 mg/dL — AB (ref 65–99)
GLUCOSE: 148 mg/dL — AB (ref 65–99)
POTASSIUM: 2.8 mmol/L — AB (ref 3.5–5.1)
Potassium: 3.5 mmol/L (ref 3.5–5.1)
Potassium: 3.1 mmol/L — ABNORMAL LOW (ref 3.5–5.1)
Potassium: 2.8 mmol/L — ABNORMAL LOW (ref 3.5–5.1)
Sodium: 133 mmol/L — ABNORMAL LOW (ref 135–145)
Sodium: 131 mmol/L — ABNORMAL LOW (ref 135–145)
Sodium: 132 mmol/L — ABNORMAL LOW (ref 135–145)
Sodium: 134 mmol/L — ABNORMAL LOW (ref 135–145)

## 2016-12-05 LAB — I-STAT CHEM 8, ED
BUN: 25 mg/dL — AB (ref 6–20)
BUN: 23 mg/dL — ABNORMAL HIGH (ref 6–20)
CALCIUM ION: 1.09 mmol/L — AB (ref 1.15–1.40)
CALCIUM ION: 1.06 mmol/L — AB (ref 1.15–1.40)
CHLORIDE: 88 mmol/L — AB (ref 101–111)
Chloride: 96 mmol/L — ABNORMAL LOW (ref 101–111)
Creatinine, Ser: 0.8 mg/dL (ref 0.61–1.24)
Creatinine, Ser: 0.7 mg/dL (ref 0.61–1.24)
GLUCOSE: 485 mg/dL — AB (ref 65–99)
Glucose, Bld: 381 mg/dL — ABNORMAL HIGH (ref 65–99)
HCT: 49 % (ref 39.0–52.0)
HEMATOCRIT: 48 % (ref 39.0–52.0)
HEMOGLOBIN: 16.3 g/dL (ref 13.0–17.0)
Hemoglobin: 16.7 g/dL (ref 13.0–17.0)
Potassium: 2.9 mmol/L — ABNORMAL LOW (ref 3.5–5.1)
Potassium: 3.5 mmol/L (ref 3.5–5.1)
SODIUM: 128 mmol/L — AB (ref 135–145)
Sodium: 132 mmol/L — ABNORMAL LOW (ref 135–145)
TCO2: 11 mmol/L — ABNORMAL LOW (ref 22–32)
TCO2: 12 mmol/L — ABNORMAL LOW (ref 22–32)

## 2016-12-05 LAB — GLUCOSE, CAPILLARY
GLUCOSE-CAPILLARY: 134 mg/dL — AB (ref 65–99)
GLUCOSE-CAPILLARY: 141 mg/dL — AB (ref 65–99)
GLUCOSE-CAPILLARY: 139 mg/dL — AB (ref 65–99)
GLUCOSE-CAPILLARY: 219 mg/dL — AB (ref 65–99)
Glucose-Capillary: 125 mg/dL — ABNORMAL HIGH (ref 65–99)
Glucose-Capillary: 322 mg/dL — ABNORMAL HIGH (ref 65–99)

## 2016-12-05 LAB — CBC
HCT: 34.9 % — ABNORMAL LOW (ref 39.0–52.0)
Hemoglobin: 12.9 g/dL — ABNORMAL LOW (ref 13.0–17.0)
MCH: 30.1 pg (ref 26.0–34.0)
MCHC: 37 g/dL — ABNORMAL HIGH (ref 30.0–36.0)
MCV: 81.4 fL (ref 78.0–100.0)
PLATELETS: 125 10*3/uL — AB (ref 150–400)
RBC: 4.29 MIL/uL (ref 4.22–5.81)
RDW: 11.9 % (ref 11.5–15.5)
WBC: 8.8 10*3/uL (ref 4.0–10.5)

## 2016-12-05 LAB — HEMOGLOBIN A1C
HEMOGLOBIN A1C: 10.8 % — AB (ref 4.8–5.6)
Mean Plasma Glucose: 263.26 mg/dL

## 2016-12-05 LAB — MRSA PCR SCREENING: MRSA BY PCR: NEGATIVE

## 2016-12-05 LAB — MAGNESIUM: Magnesium: 1.6 mg/dL — ABNORMAL LOW (ref 1.7–2.4)

## 2016-12-05 LAB — I-STAT CG4 LACTIC ACID, ED
LACTIC ACID, VENOUS: 2.19 mmol/L — AB (ref 0.5–1.9)
Lactic Acid, Venous: 1.6 mmol/L (ref 0.5–1.9)

## 2016-12-05 LAB — PROCALCITONIN: Procalcitonin: 0.17 ng/mL

## 2016-12-05 LAB — LACTIC ACID, PLASMA: LACTIC ACID, VENOUS: 1.1 mmol/L (ref 0.5–1.9)

## 2016-12-05 LAB — HIV ANTIBODY (ROUTINE TESTING W REFLEX): HIV Screen 4th Generation wRfx: NONREACTIVE

## 2016-12-05 LAB — CREATININE, URINE, RANDOM: Creatinine, Urine: 30.59 mg/dL

## 2016-12-05 LAB — SODIUM, URINE, RANDOM: Sodium, Ur: 59 mmol/L

## 2016-12-05 MED ORDER — MAGNESIUM SULFATE 2 GM/50ML IV SOLN
2.0000 g | Freq: Once | INTRAVENOUS | Status: AC
  Administered 2016-12-05: 2 g via INTRAVENOUS
  Filled 2016-12-05: qty 50

## 2016-12-05 MED ORDER — SODIUM CHLORIDE 0.9 % IV BOLUS (SEPSIS)
1000.0000 mL | Freq: Once | INTRAVENOUS | Status: AC
  Administered 2016-12-05: 1000 mL via INTRAVENOUS

## 2016-12-05 MED ORDER — NICOTINE 21 MG/24HR TD PT24: 21.0000 mg | MEDICATED_PATCH | Freq: Every day | TRANSDERMAL | Status: DC

## 2016-12-05 MED ORDER — ESCITALOPRAM OXALATE 10 MG PO TABS
10.0000 mg | ORAL_TABLET | Freq: Two times a day (BID) | ORAL | Status: DC
  Administered 2016-12-05 – 2016-12-09 (×9): 10 mg via ORAL
  Filled 2016-12-05 (×9): qty 1

## 2016-12-05 MED ORDER — ONDANSETRON 4 MG PO TBDP: 4.0000 mg | ORAL_TABLET | Freq: Once | ORAL | Status: DC

## 2016-12-05 MED ORDER — SODIUM CHLORIDE 0.9 % IV SOLN
INTRAVENOUS | Status: DC
  Administered 2016-12-05: 4.3 [IU]/h via INTRAVENOUS
  Filled 2016-12-05: qty 1

## 2016-12-05 MED ORDER — GABAPENTIN 300 MG PO CAPS
600.0000 mg | ORAL_CAPSULE | Freq: Three times a day (TID) | ORAL | Status: DC
  Administered 2016-12-05 – 2016-12-09 (×13): 600 mg via ORAL
  Filled 2016-12-05 (×13): qty 2

## 2016-12-05 MED ORDER — INSULIN REGULAR HUMAN 100 UNIT/ML IJ SOLN
INTRAMUSCULAR | Status: DC
  Filled 2016-12-05: qty 1

## 2016-12-05 MED ORDER — ONDANSETRON HCL 4 MG/2ML IJ SOLN
4.0000 mg | Freq: Three times a day (TID) | INTRAMUSCULAR | Status: DC | PRN
Start: 2016-12-05 — End: 2016-12-09
  Administered 2016-12-05 – 2016-12-09 (×6): 4 mg via INTRAVENOUS
  Filled 2016-12-05 (×6): qty 2

## 2016-12-05 MED ORDER — OXYCODONE-ACETAMINOPHEN 5-325 MG PO TABS
1.0000 | ORAL_TABLET | ORAL | Status: DC | PRN
  Filled 2016-12-05: qty 1

## 2016-12-05 MED ORDER — CARVEDILOL 12.5 MG PO TABS
12.5000 mg | ORAL_TABLET | Freq: Two times a day (BID) | ORAL | Status: DC
  Administered 2016-12-05 – 2016-12-09 (×9): 12.5 mg via ORAL
  Filled 2016-12-05 (×9): qty 1

## 2016-12-05 MED ORDER — INSULIN GLARGINE 100 UNIT/ML ~~LOC~~ SOLN
25.0000 [IU] | Freq: Two times a day (BID) | SUBCUTANEOUS | Status: DC
  Administered 2016-12-05: 25 [IU] via SUBCUTANEOUS
  Filled 2016-12-05 (×2): qty 0.25

## 2016-12-05 MED ORDER — ARIPIPRAZOLE 5 MG PO TABS
5.0000 mg | ORAL_TABLET | Freq: Every day | ORAL | Status: DC
  Administered 2016-12-05 – 2016-12-09 (×5): 5 mg via ORAL
  Filled 2016-12-05 (×5): qty 1

## 2016-12-05 MED ORDER — INSULIN ASPART 100 UNIT/ML ~~LOC~~ SOLN: 0.0000 [IU] | Freq: Three times a day (TID) | SUBCUTANEOUS | Status: DC

## 2016-12-05 MED ORDER — SODIUM CHLORIDE 0.9 % IV SOLN
INTRAVENOUS | Status: DC
  Administered 2016-12-05: 22:00:00 via INTRAVENOUS

## 2016-12-05 MED ORDER — POTASSIUM CHLORIDE 10 MEQ/100ML IV SOLN
10.0000 meq | INTRAVENOUS | Status: DC
Start: 2016-12-05 — End: 2016-12-05

## 2016-12-05 MED ORDER — INSULIN GLARGINE 100 UNIT/ML ~~LOC~~ SOLN
25.0000 [IU] | Freq: Two times a day (BID) | SUBCUTANEOUS | Status: DC
  Filled 2016-12-05: qty 0.25

## 2016-12-05 MED ORDER — POTASSIUM CHLORIDE CRYS ER 20 MEQ PO TBCR
40.0000 meq | EXTENDED_RELEASE_TABLET | Freq: Once | ORAL | Status: AC
  Administered 2016-12-05: 40 meq via ORAL
  Filled 2016-12-05: qty 2

## 2016-12-05 MED ORDER — POTASSIUM CHLORIDE CRYS ER 20 MEQ PO TBCR
40.0000 meq | EXTENDED_RELEASE_TABLET | ORAL | Status: AC
  Administered 2016-12-05 (×2): 40 meq via ORAL
  Filled 2016-12-05 (×2): qty 2

## 2016-12-05 MED ORDER — POTASSIUM CHLORIDE 10 MEQ/100ML IV SOLN
10.0000 meq | INTRAVENOUS | Status: AC
  Administered 2016-12-05 (×4): 10 meq via INTRAVENOUS
  Filled 2016-12-05 (×4): qty 100

## 2016-12-05 MED ORDER — ENOXAPARIN SODIUM 40 MG/0.4ML ~~LOC~~ SOLN
40.0000 mg | SUBCUTANEOUS | Status: DC
  Administered 2016-12-05: 40 mg via SUBCUTANEOUS
  Filled 2016-12-05 (×2): qty 0.4

## 2016-12-05 MED ORDER — METHOCARBAMOL 500 MG PO TABS: 750.0000 mg | ORAL_TABLET | Freq: Four times a day (QID) | ORAL | Status: DC | PRN

## 2016-12-05 MED ORDER — ONDANSETRON HCL 4 MG/2ML IJ SOLN
4.0000 mg | Freq: Three times a day (TID) | INTRAMUSCULAR | Status: AC | PRN
  Administered 2016-12-05: 4 mg via INTRAVENOUS
  Filled 2016-12-05: qty 2

## 2016-12-05 MED ORDER — INSULIN ASPART 100 UNIT/ML ~~LOC~~ SOLN
0.0000 [IU] | Freq: Every day | SUBCUTANEOUS | Status: DC
  Administered 2016-12-05: 4 [IU] via SUBCUTANEOUS

## 2016-12-05 MED ORDER — SODIUM CHLORIDE 0.9 % IV SOLN
INTRAVENOUS | Status: DC
  Administered 2016-12-05: 03:00:00 via INTRAVENOUS

## 2016-12-05 MED ORDER — DULOXETINE HCL 30 MG PO CPEP
30.0000 mg | ORAL_CAPSULE | Freq: Two times a day (BID) | ORAL | Status: DC
  Administered 2016-12-05 – 2016-12-09 (×9): 30 mg via ORAL
  Filled 2016-12-05 (×10): qty 1

## 2016-12-05 MED ORDER — POTASSIUM CHLORIDE 20 MEQ/15ML (10%) PO SOLN
40.0000 meq | Freq: Once | ORAL | Status: AC
  Administered 2016-12-05: 40 meq via ORAL
  Filled 2016-12-05: qty 30

## 2016-12-05 MED ORDER — ACETAMINOPHEN 325 MG PO TABS: 650.0000 mg | ORAL_TABLET | Freq: Four times a day (QID) | ORAL | Status: DC | PRN

## 2016-12-05 MED ORDER — TRAZODONE HCL 100 MG PO TABS
100.0000 mg | ORAL_TABLET | Freq: Every day | ORAL | Status: DC
  Administered 2016-12-05 – 2016-12-08 (×4): 100 mg via ORAL
  Filled 2016-12-05: qty 1
  Filled 2016-12-05 (×2): qty 2
  Filled 2016-12-05: qty 1

## 2016-12-05 MED ORDER — FENOFIBRATE 160 MG PO TABS
160.0000 mg | ORAL_TABLET | Freq: Every day | ORAL | Status: DC
  Administered 2016-12-05 – 2016-12-09 (×5): 160 mg via ORAL
  Filled 2016-12-05 (×6): qty 1

## 2016-12-05 MED ORDER — DEXTROSE-NACL 5-0.45 % IV SOLN
INTRAVENOUS | Status: DC
  Administered 2016-12-05: 06:00:00 via INTRAVENOUS

## 2016-12-05 MED ORDER — HYDRALAZINE HCL 20 MG/ML IJ SOLN: 5.0000 mg | INTRAMUSCULAR | Status: DC | PRN

## 2016-12-05 MED ORDER — OXYCODONE-ACETAMINOPHEN 5-325 MG PO TABS: 1.0000 | ORAL_TABLET | Freq: Once | ORAL | Status: DC

## 2016-12-05 MED ORDER — PANTOPRAZOLE SODIUM 40 MG PO TBEC
40.0000 mg | DELAYED_RELEASE_TABLET | Freq: Every day | ORAL | Status: DC
  Administered 2016-12-06 – 2016-12-09 (×4): 40 mg via ORAL
  Filled 2016-12-05 (×4): qty 1

## 2016-12-05 MED ORDER — DEXTROSE-NACL 5-0.45 % IV SOLN: INTRAVENOUS | Status: DC

## 2016-12-05 MED ORDER — ACETAMINOPHEN 325 MG PO TABS
650.0000 mg | ORAL_TABLET | Freq: Four times a day (QID) | ORAL | Status: DC | PRN
  Administered 2016-12-05: 650 mg via ORAL
  Filled 2016-12-05: qty 2

## 2016-12-05 MED ORDER — ZOLPIDEM TARTRATE 5 MG PO TABS: 5.0000 mg | ORAL_TABLET | Freq: Every evening | ORAL | Status: DC | PRN

## 2016-12-05 MED ORDER — ONDANSETRON HCL 4 MG/2ML IJ SOLN
4.0000 mg | Freq: Once | INTRAMUSCULAR | Status: AC
  Administered 2016-12-05: 4 mg via INTRAVENOUS
  Filled 2016-12-05: qty 2

## 2016-12-05 NOTE — H&P (Addendum)
History and Physical    Terry NeedyRobert F Petzold ZOX:[id number] DOB: [birth year] DOA: 12/04/2016  Referring MD/NP/PA:   PCP: Cheron SchaumannVelazquez, Gretchen Y., MD   Patient coming from:  The patient is coming from home.  At baseline, pt is independent for most of ADL.   Chief Complaint: Nausea, vomiting, diarrhea  HPI: Terry Schultz is a 46 y.o. male with medical history significant of hypertension, hyperlipidemia, diabetes mellitus, anxiety, alcohol abuse in remission, tobacco abuse, pancreatitis, tobacco abuse, who presents with nausea, vomiting and diarrhea.  Patient states that she has been having nausea, vomiting, diarrhea in the past 3 days. He vomited several times each day without blood in the vomitus. He has 2-3 watery diarrhea each day. Denies abdominal pain, but has epigastric heart or burning sensation. No recent antibiotic use. Patient does not have chest pain, SOB, cough, fever or chills. No symptoms of UTI. He has chronic bilateral upper back pain, which slightly worse. No injury.  ED Course: pt was found to have WBC 16.7, likely acid 1.60, acute renal injury with creatinine 1.97, DKA with AG of 36, blood sugar 494, bicarbonate of 10, potassium 3.3, pseudohyponatremia, temperature normal, tachycardia, tachypnea. Patient is admitted to stepdown as inpatient.   Review of Systems:   General: no fevers, chills, no body weight gain, has poor appetite, has fatigue HEENT: no blurry vision, hearing changes or sore throat Respiratory: no dyspnea, coughing, wheezing CV: no chest pain, no palpitations GI: has nausea, vomiting, abdominal pain, diarrhea, no constipation GU: no dysuria, burning on urination, increased urinary frequency, hematuria  Ext: no leg edema Neuro: no unilateral weakness, numbness, or tingling, no vision change or hearing loss Skin: no rash, no skin tear. MSK: No muscle spasm, no deformity, no limitation of range of movement in spin. has back pain Heme: No easy bruising.  Travel  history: No recent long distant travel.  Allergy:  Allergies  Allergen Reactions  . Morphine Itching  . Penicillins Itching    Has patient had a PCN reaction causing immediate rash, facial/tongue/throat swelling, SOB or lightheadedness with hypotension: No Has patient had a PCN reaction causing severe rash involving mucus membranes or skin necrosis: No Has patient had a PCN reaction that required hospitalization: No Has patient had a PCN reaction occurring within the last 10 years: Yes If all of the above answers are "NO", then may proceed with Cephalosporin use.  Marland Kitchen. Amoxicillin Rash  . Meperidine Itching    Past Medical History:  Diagnosis Date  . Alcoholism (HCC)   . Diabetes mellitus without complication (HCC)   . Pancreatitis     Past Surgical History:  Procedure Laterality Date  . CHOLECYSTECTOMY      Social History:  reports that he has been smoking.  He has never used smokeless tobacco. He reports that he does not drink alcohol or use drugs.  Family History:  Family History  Problem Relation Age of Onset  . Kidney Stones Father      Prior to Admission medications   Medication Sig Start Date End Date Taking? Authorizing Provider  atorvastatin (LIPITOR) 10 MG tablet Take 10 mg by mouth daily. 01/23/14   [provider]  Blood Glucose Monitoring Suppl (GLUCOCOM BLOOD GLUCOSE MONITOR) DEVI Apply 1 strip topically 2 (two) times daily as needed. 11/21/13   [provider]  canagliflozin (INVOKANA) 100 MG TABS tablet Take 100 mg by mouth daily. 08/20/14   [provider]  carvedilol (COREG) 12.5 MG tablet Take 12.5 mg by mouth 2 (  two) times daily. 07/11/15   [provider]  clonazePAM (KLONOPIN) 1 MG tablet Take 1 mg by mouth 2 (two) times daily as needed. 12/30/15 12/29/16  [provider]  DULoxetine (CYMBALTA) 30 MG capsule Take 30 mg by mouth 2 (two) times daily. 01/17/16 01/16/17  [provider]  fenofibrate 160 MG  tablet Take 160 mg by mouth. 06/04/16   [provider]  gabapentin (NEURONTIN) 300 MG capsule Take 600 mg by mouth 3 (three) times daily.  05/30/16   [provider]  glipiZIDE (GLUCOTROL) 10 MG tablet Take 10 mg by mouth. 05/30/16   [provider]  glucose blood (BAYER CONTOUR TEST) test strip Apply 1 strip topically 3 (three) times daily as needed. 04/10/14   [provider]  hydrOXYzine (VISTARIL) 50 MG capsule Take 50 mg by mouth 2 (two) times daily as needed. 07/11/15   [provider]  Insulin Glargine (LANTUS SOLOSTAR) 100 UNIT/ML Solostar Pen Inject 38 units subcutaneously daily or as directed for diabetes E11.9 06/11/16   [provider]  levothyroxine (SYNTHROID, LEVOTHROID) 112 MCG tablet Take 112 mcg by mouth daily. 05/31/15   [provider]  LORazepam (ATIVAN) 0.5 MG tablet Take 2 tablets (1 mg total) by mouth every 8 (eight) hours as needed (shaking). 08/27/16   Bethann Berkshire, MD  LORazepam (ATIVAN) 0.5 MG tablet Take 1 tablet (0.5 mg total) by mouth every 8 (eight) hours as needed (shaking). 08/27/16   Bethann Berkshire, MD  magnesium oxide (MAG-OX) 400 MG tablet Take 400 mg by mouth daily. Prescribed yesterday have not picked it up yet 08/17/16 08/17/17  [provider]  omeprazole (PRILOSEC) 20 MG capsule Take 20 mg by mouth daily. 07/23/14   [provider]  ondansetron (ZOFRAN) 4 MG tablet Take 4 mg by mouth every 6 (six) hours as needed. 07/23/14   [provider]  potassium chloride (K-DUR) 10 MEQ tablet Take 1 tablet (10 mEq total) by mouth daily. 08/27/16   Bethann Berkshire, MD  traZODone (DESYREL) 50 MG tablet Take 50 mg by mouth at bedtime as needed.  07/11/15   [provider]    Physical Exam: Vitals:   12/04/16 2144 12/04/16 2337 12/05/16 0000 12/05/16 0045  BP: 109/85 127/88 (!) 148/91 (!) 148/94  Pulse: (!) 121 (!) 118 (!) 108 100  Resp: 20 18    Temp: (!) 97.4 F (36.3 C)     TempSrc:  Oral     SpO2: 98% 99% 100% 100%  Weight: 63.5 kg (140 lb)     Height:  (1.676 m)      General: Not in acute distress HEENT:       Eyes: PERRL, EOMI, no scleral icterus.       ENT: No discharge from the ears and nose, no pharynx injection, no tonsillar enlargement.        Neck: No JVD, no bruit, no mass felt. Heme: No neck lymph node enlargement. Cardiac: S1/S2, RRR, No murmurs, No gallops or rubs. Respiratory: Good air movement bilaterally. No rales, wheezing, rhonchi or rubs. GI: Soft, nondistended, nontender, no rebound pain, no organomegaly, BS present. GU: No hematuria Ext: No pitting leg edema bilaterally. 2+DP/PT pulse bilaterally. Musculoskeletal: No joint deformities, No joint redness or warmth, no limitation of ROM in spin. Skin: No rashes.  Neuro: Alert, oriented X3, cranial nerves II-XII grossly intact, moves all extremities normally. Muscle strength 5/5 in all extremities, sensation to light touch intact. Brachial reflex 2+ bilaterally. Knee reflex  1+ bilaterally. Negative Babinski's sign. Normal finger to nose test. Psych: Patient is not psychotic, no suicidal or hemocidal ideation.  Labs on Admission: I have personally reviewed following labs and imaging studies  CBC:  Recent Labs Lab 12/04/16 2156 12/05/16 0105 12/05/16 0231  WBC 16.7*  --   --   HGB 17.8* 16.7 16.3  HCT 48.7 49.0 48.0  MCV 83.7  --   --   PLT 212  --   --    Basic Metabolic Panel:  Recent Labs Lab 12/04/16 2156 12/05/16 0105 12/05/16 0231  NA 129* 128* 132*  K 3.3* 2.9* 3.5  CL 83* 88* 96*  CO2 10*  --   --   GLUCOSE 494* 485* 381*  BUN 23* 25* 23*  CREATININE 1.97* 0.80 0.70  CALCIUM 9.6  --   --    GFR: Estimated Creatinine Clearance: 101.4 mL/min (by C-G formula based on SCr of 0.7 mg/dL). Liver Function Tests:  Recent Labs Lab 12/04/16 2156  AST 34  ALT 51  ALKPHOS 114  BILITOT 2.6*  PROT 7.7  ALBUMIN 4.3    Recent Labs Lab 12/04/16 2156  LIPASE 18   No  results for input(s): AMMONIA in the last 168 hours. Coagulation Profile: No results for input(s): INR, PROTIME in the last 168 hours. Cardiac Enzymes: No results for input(s): CKTOTAL, CKMB, CKMBINDEX, TROPONINI in the last 168 hours. BNP (last 3 results) No results for input(s): PROBNP in the last 8760 hours. HbA1C: No results for input(s): HGBA1C in the last 72 hours. CBG:  Recent Labs Lab 12/05/16 0049 12/05/16 0148 12/05/16 0255  GLUCAP 493* 400* 389*   Lipid Profile: No results for input(s): CHOL, HDL, LDLCALC, TRIG, CHOLHDL, LDLDIRECT in the last 72 hours. Thyroid Function Tests: No results for input(s): TSH, T4TOTAL, FREET4, T3FREE, THYROIDAB in the last 72 hours. Anemia Panel: No results for input(s): VITAMINB12, FOLATE, FERRITIN, TIBC, IRON, RETICCTPCT in the last 72 hours. Urine analysis:    Component Value Date/Time   COLORURINE YELLOW 08/27/2016 1015   APPEARANCEUR CLOUDY (A) 08/27/2016 1015   LABSPEC >1.030 (H) 08/27/2016 1015   PHURINE 6.0 08/27/2016 1015   GLUCOSEU >=500 (A) 08/27/2016 1015   HGBUR LARGE (A) 08/27/2016 1015   BILIRUBINUR SMALL (A) 08/27/2016 1015   KETONESUR 15 (A) 08/27/2016 1015   PROTEINUR 100 (A) 08/27/2016 1015   UROBILINOGEN 0.2 05/31/2009 1242   NITRITE NEGATIVE 08/27/2016 1015   LEUKOCYTESUR NEGATIVE 08/27/2016 1015   Sepsis Labs: (procalcitonin:4,lacticidven:4) )No results found for this or any previous visit (from the past 240 hour(s)).   Radiological Exams on Admission: No results found.   EKG: Not done in ED, will get one.   Assessment/Plan Principal Problem:   DKA (diabetic ketoacidoses) (HCC) Active Problems:   Essential hypertension   Uncontrolled type 2 diabetes mellitus without complication, with long-term current use of insulin (HCC)   Diabetes mellitus without complication (HCC)   Alcoholism (HCC)   Nausea vomiting and diarrhea   Depression with anxiety   HLD (hyperlipidemia)   AKI (acute kidney  injury) (HCC)   Chronic back pain   SIRS (systemic inflammatory response syndrome) (HCC)   DKA (diabetic ketoacidoses) (HCC): DKA with AG of 36, blood sugar 494, bicarbonate of 10. This is triggered by Nausea, vomiting, diarrhea. Mental status normal. - Admit to stepdown  - 3L of NS bolus - start DKA protocol with BMP q4h - IVF: NS 125 cc/h; will switch to D5-1/2NS when CBG<250 - replete K as needed -  Zofran prn nausea  - NPO   Nausea vomiting and diarrhea: Etiology is not clear, likely due to viral enteritis. No recent antibiotics use. Patient has leukocytosis, but no fever. Currently no diarrhea. -When necessary Zofran for nausea -IV fluid as above -If patient has persistent diarrhea, will need to check c diff pcr (not ordered yet)  SIRS vs. Sepsis: pt meets the criteria for sepsis with tachycardia, tachypnea and leukocytosis. Lactic acid is normal. -blood culture x 2 and UA and Ux -will get Procalcitonin and trend lactic acid levels per sepsis protocol. -IVF: 3L of NS bolus in ED, followed by 125 cc/h   AKI: pt's Cre is 1.97 on admission. Likely due to prerenal secondary to dehydration and continuation of NSAIDs. - IVF as above - Check FeNa - Follow up renal function by BMP - Hold Nabumetone  Essential hypertension: -continue coreg -IV hydralazine when necessary  GERD: -Protonix  Uncontrolled type 2 diabetes mellitus without complication, with long-term current use of insulin (HCC): A1c 12.0 on 11/24/15. Patient is taking glipizide, glargine insulin and Invokana. Now has DKA.  -on DKA protocol  Tobacco abuse and Alcohol abuse:  Patient had rehabilitation, did not drink alcohol since 09/11/16. -Did counseling about importance of quitting smoking -Did counseling about the importance of quitting drinking -Nicotine patch  HLD (hyperlipidemia): -on fenofibrate  Chronic back pain: -prn percocet  Depression and anxiety: Stable, no suicidal or homicidal  ideations. -Continue home medications    DVT ppx: SQ Lovenox Code Status: Full code Family Communication: None at bed side.  Disposition Plan:  Anticipate discharge back to previous home environment Consults called:  none Admission status:    SDU/inpation       Date of Service 12/05/2016    Lorretta Harp [facility] Pager [phone]  If 7PM-7AM, please contact night-coverage [email or web address] Password [email or web address] 12/05/2016, 3:00 AM

## 2016-12-05 NOTE — ED Provider Notes (Addendum)
[facility]-EMERGENCY DEPT Provider Note   CSN: [id number] Arrival date & time: 12/04/16  2126     History   Chief Complaint Chief Complaint  Patient presents with  . Nausea  . Abdominal Pain    HPI Terry Schultz is a 46 y.o. male.  HPI Pt with hx of alcoholism, DM comes in with cc of nausea, emesis. PT reports that he started getting sick 3 days ago. Pt was in a rehab and was discharged last week. Pt hasn't had an alcoholic beverage since being  discharged. He started having nausea, emesis w/o abd pain 2-3 days ago. He  Has had 10 episodes of emesis, mostly whatever he ingests. Pt has some back pain, but he has no burning with urination or blood in the urine.  Past Medical History:  Diagnosis Date  . Alcoholism (HCC)   . Diabetes mellitus without complication (HCC)   . Pancreatitis     Patient Active Problem List   Diagnosis Date Noted  . Moderate episode of recurrent major depressive disorder (HCC) 05/22/2015  . Panic disorder 05/22/2015  . Hepatic steatosis 11/21/2013  . Nondependent alcohol abuse, in remission 09/30/2013  . Allergic rhinitis 09/30/2013  . Anxiety disorder 09/30/2013  . Chronic cholecystitis 09/30/2013  . Disorder of magnesium metabolism 09/30/2013  . Essential hypertension 09/30/2013  . Familial hypertriglyceridemia 09/30/2013  . Hypercholesterolemia 09/30/2013  . Hypothyroidism 09/30/2013  . Insomnia 09/30/2013  . Low testosterone 09/30/2013  . OSA (obstructive sleep apnea) 09/30/2013  . Pancreatic pseudocyst 09/30/2013  . Depressive disorder 09/30/2013  . Recurrent pancreatitis (HCC) 09/30/2013  . Uncontrolled type 2 diabetes mellitus without complication, with long-term current use of insulin (HCC) 09/30/2013  . Vitamin D deficiency 09/30/2013    History reviewed. No pertinent surgical history.     Home Medications    Prior to Admission medications   Medication Sig Start Date End Date Taking? Authorizing Provider  atorvastatin  (LIPITOR) 10 MG tablet Take 10 mg by mouth daily. 01/23/14   [provider]  Blood Glucose Monitoring Suppl (GLUCOCOM BLOOD GLUCOSE MONITOR) DEVI Apply 1 strip topically 2 (two) times daily as needed. 11/21/13   [provider]  canagliflozin (INVOKANA) 100 MG TABS tablet Take 100 mg by mouth daily. 08/20/14   [provider]  carvedilol (COREG) 12.5 MG tablet Take 12.5 mg by mouth 2 (two) times daily. 07/11/15   [provider]  clonazePAM (KLONOPIN) 1 MG tablet Take 1 mg by mouth 2 (two) times daily as needed. 12/30/15 12/29/16  [provider]  DULoxetine (CYMBALTA) 30 MG capsule Take 30 mg by mouth 2 (two) times daily. 01/17/16 01/16/17  [provider]  fenofibrate 160 MG tablet Take 160 mg by mouth. 06/04/16   [provider]  gabapentin (NEURONTIN) 300 MG capsule Take 600 mg by mouth 3 (three) times daily.  05/30/16   [provider]  glipiZIDE (GLUCOTROL) 10 MG tablet Take 10 mg by mouth. 05/30/16   [provider]  glucose blood (BAYER CONTOUR TEST) test strip Apply 1 strip topically 3 (three) times daily as needed. 04/10/14   [provider]  hydrOXYzine (VISTARIL) 50 MG capsule Take 50 mg by mouth 2 (two) times daily as needed. 07/11/15   [provider]  Insulin Glargine (LANTUS SOLOSTAR) 100 UNIT/ML Solostar Pen Inject 38 units subcutaneously daily or as directed for diabetes E11.9 06/11/16   [provider]  levothyroxine (SYNTHROID, LEVOTHROID) 112 MCG tablet Take 112 mcg by mouth daily. 05/31/15  [provider]  LORazepam (ATIVAN) 0.5 MG tablet Take 2 tablets (1 mg total) by mouth every 8 (eight) hours as needed (shaking). 08/27/16   Bethann Berkshire, MD  LORazepam (ATIVAN) 0.5 MG tablet Take 1 tablet (0.5 mg total) by mouth every 8 (eight) hours as needed (shaking). 08/27/16   Bethann Berkshire, MD  magnesium oxide (MAG-OX) 400 MG tablet Take 400 mg by mouth daily. Prescribed yesterday  have not picked it up yet 08/17/16 08/17/17  [provider]  omeprazole (PRILOSEC) 20 MG capsule Take 20 mg by mouth daily. 07/23/14   [provider]  ondansetron (ZOFRAN) 4 MG tablet Take 4 mg by mouth every 6 (six) hours as needed. 07/23/14   [provider]  potassium chloride (K-DUR) 10 MEQ tablet Take 1 tablet (10 mEq total) by mouth daily. 08/27/16   Bethann Berkshire, MD  traZODone (DESYREL) 50 MG tablet Take 50 mg by mouth at bedtime as needed.  07/11/15   [provider]    Family History No family history on file.  Social History Social History  Substance Use Topics  . Smoking status: Current Some Day Smoker  . Smokeless tobacco: Never Used  . Alcohol use No     Comment: June 15th     Allergies   Morphine; Penicillins; Amoxicillin; and Meperidine   Review of Systems Review of Systems  Constitutional: Positive for activity change.  Genitourinary: Positive for flank pain. Negative for dysuria.  All other systems reviewed and are negative.    Physical Exam Updated Vital Signs BP 127/88 (BP Location: Left Arm)   Pulse (!) 118   Temp (!) 97.4 F (36.3 C) (Oral)   Resp 18   Ht  (1.676 m)   Wt 63.5 kg (140 lb)   SpO2 99%   BMI 22.60 kg/m   Physical Exam  Constitutional: He is oriented to person, place, and time. He appears well-developed.  HENT:  Head: Atraumatic.  Eyes: Pupils are equal, round, and reactive to light.  Neck: Neck supple.  Cardiovascular: Normal rate.   Pulmonary/Chest: Effort normal.  Abdominal: Soft. There is no tenderness.  Bilateral flank tenderness  Musculoskeletal: He exhibits no edema.  Neurological: He is alert and oriented to person, place, and time.  Skin: Skin is warm.  Nursing note and vitals reviewed.    ED Treatments / Results  Labs (all labs ordered are listed, but only abnormal results are displayed) Labs Reviewed  COMPREHENSIVE METABOLIC PANEL - Abnormal; Notable for the following:        Result Value   Sodium 129 (*)    Potassium 3.3 (*)    Chloride 83 (*)    CO2 10 (*)    Glucose, Bld 494 (*)    BUN 23 (*)    Creatinine, Ser 1.97 (*)    Total Bilirubin 2.6 (*)    GFR calc non Af Amer 38 (*)    GFR calc Af Amer 45 (*)    Anion gap 36 (*)    All other components within normal limits  CBC - Abnormal; Notable for the following:    WBC 16.7 (*)    RBC 5.82 (*)    Hemoglobin 17.8 (*)    MCHC 36.6 (*)    All other components within normal limits  CBG MONITORING, ED - Abnormal; Notable for the following:    Glucose-Capillary 493 (*)    All other components within normal limits  LIPASE, BLOOD  URINALYSIS, ROUTINE W REFLEX MICROSCOPIC  I-STAT CHEM 8, ED  I-STAT VENOUS BLOOD GAS, ED  I-STAT CG4 LACTIC ACID, ED    EKG  EKG Interpretation None       Radiology No results found.  Procedures Procedures (including critical care time) CRITICAL CARE Performed by: Derwood Kaplan   Total critical care time: 40 minutes  Critical care time was exclusive of separately billable procedures and treating other patients.  Critical care was necessary to treat or prevent imminent or life-threatening deterioration.  Critical care was time spent personally by me on the following activities: development of treatment plan with patient and/or surrogate as well as nursing, discussions with consultants, evaluation of patient's response to treatment, examination of patient, obtaining history from patient or surrogate, ordering and performing treatments and interventions, ordering and review of laboratory studies, ordering and review of radiographic studies, pulse oximetry and re-evaluation of patient's condition.   Medications Ordered in ED Medications  insulin regular (NOVOLIN R,HUMULIN R) 100 Units in sodium chloride 0.9 % 100 mL (1 Units/mL) infusion (4.3 Units/hr Intravenous New Bag/Given 12/05/16 0058)  sodium chloride 0.9 % bolus 1,000 mL (1,000 mLs Intravenous New  Bag/Given 12/05/16 0030)    And  sodium chloride 0.9 % bolus 1,000 mL (not administered)    And  0.9 %  sodium chloride infusion (not administered)  dextrose 5 %-0.45 % sodium chloride infusion (not administered)  oxyCODONE-acetaminophen (PERCOCET/ROXICET) 5-325 MG per tablet 1 tablet (not administered)  ondansetron (ZOFRAN-ODT) disintegrating tablet 4 mg (not administered)  potassium chloride 10 mEq in 100 mL IVPB (not administered)  ondansetron (ZOFRAN) injection 4 mg (4 mg Intravenous Given 12/04/16 2154)     Initial Impression / Assessment and Plan / ED Course  I have reviewed the triage vital signs and the nursing notes.  Pertinent labs & imaging results that were available during my care of the patient were reviewed by me and considered in my medical decision making (see chart for details).     Pt comes in with cc of nausea, emesis, and his labs suggest that he has AKI with DKA. I have added other labs and started DKA treatment protocol. I am not quite sure what precipitated DKA. ROS is neg for infection. Pt has bilateral flank pain, there is no midline back pain. He has hx of back pain, but UTI/pyelonephritis is in the ddx. UA has been added to the labs. Admit to medicine, Dr. Clyde Lundborg is aware of the pending workup.   Final Clinical Impressions(s) / ED Diagnoses   Final diagnoses:  Diabetic ketoacidosis without coma associated with other specified diabetes mellitus (HCC)  AKI (acute kidney injury) (HCC)    New Prescriptions New Prescriptions   No medications on file     Derwood Kaplan, MD 12/05/16 0105    Derwood Kaplan, MD 12/05/16 1610

## 2016-12-05 NOTE — Progress Notes (Signed)
PROGRESS NOTE   Terry NeedyRobert F Steeves  ZOX:[id number]    DOB: 1970/08/08    DOA: 12/04/2016  PCP: Cheron SchaumannVelazquez, Gretchen Y., MD   I have briefly reviewed patients [facility] medical [facility].  Brief Narrative:  46 year old male with PMH of type II DM/IDDM with neuropathy, HTN, HLD, hypothyroid, anxiety, depression, GERD, alcohol abuse set to be in remission, tobacco abuse, pancreatitis presented to ED with 3 days history of intractable nausea, nonbloody emesis and 2-3 episodes of loose stools daily, ongoing heartburn, admitted with diagnosis of DKA, dehydration, acute kidney injury. Slowly improving. No recent antibiotic use. Hospitalized a month ago.   Assessment & Plan:   Principal Problem:   DKA (diabetic ketoacidoses) (HCC) Active Problems:   Essential hypertension   Uncontrolled type 2 diabetes mellitus without complication, with long-term current use of insulin (HCC)   Diabetes mellitus without complication (HCC)   Alcoholism (HCC)   Nausea vomiting and diarrhea   Depression with anxiety   HLD (hyperlipidemia)   AKI (acute kidney injury) (HCC)   Chronic back pain   SIRS (systemic inflammatory response syndrome) (HCC)   1. DKA type II DM, not at goal: On initial arrival, blood sugar 494, bicarbonate 10 and anion gap 36. Reports average blood sugars in the 180s at home when he checks it twice daily. Cannot remember recent A1c and none in system recently. A1c in 2017:12. Check A1c. Admitted to stepdown unit. Treated per DKA protocol with aggressive IV fluid hydration, insulin drip, frequent BMP checks. Improving. Anion gap has closed. Bicarbonate still slightly low at 18. Hopefully bicarbonate normalizes in the next 24 hours. At that time transition to Lantus and SSI. Continue to hold oral hypoglycemics. 2. Diarrhea and intractable nausea and vomiting: DD: Due to DKA, acute viral GE, diabetic gastroparesis. Denies sick contacts with similar complaints or recent antibiotic use.  Treat supportively with bowel rest, IV fluids, antiemetics and PPIs. Advance diet as tolerated. If has persistent diarrhea, watery stools then will consider checking for C. difficile. 3. Dehydration with hyponatremia: Secondary to GI losses and poor oral intake. IV fluids and follow. Hyponatremia is related to dehydration and hyperglycemia/pseudohyponatremia. Improving. 4. Acute kidney injury: Creatinine 1.97 on admission. Secondary to dehydration and NSAIDs. Hold NSAIDs. Hydrated with IV fluids. Resolved. Continue to monitor BMPs. 5. Essential hypertension: Controlled. Continue carvedilol. 6. Hypokalemia: Replace aggressively and follow. 7. Hypomagnesemia: Replace and follow. 8. Anxiety and depression: Stable. Continue Abilify, duloxetine, trazodone and Lexapro. 9. Hyperlipidemia: Continue fenofibrate. 10. GERD: Continue PPI. 11. Tobacco abuse: Cessation counseled. Continue nicotine patch. 12. History of alcohol abuse: Said to be in remission and apparently has not had a drink since 09/11/16. 13. Chronic back pain: Controlled. Continue pain management. 14. SIRS: likely secondary to DKA rather than infectious etiology. 15. Thrombocytopenia: Possibly from acute distress. Follow CBC in a.m. No bleeding reported.   DVT prophylaxis: Lovenox Code Status: Full Family Communication: None at bedside Disposition: Admitted to stepdown unit. DC home when medically improved.   Consultants:  None   Procedures:  None  Antimicrobials:  None    Subjective: Seen this morning while he was still in the ED. He reports feeling better. No vomiting since 7 PM last night. Has some reflux/heartburn. Denies pain. Reports 2-3 loose stools and had one this morning.   ROS: No dizziness, lightheadedness. Reports not having slept well over the last 2 nights.  Objective:  Vitals:   12/05/16 0930 12/05/16 1000 12/05/16 1030 12/05/16 1100  BP: 121/80 119/79 121/82  111/78  Pulse: 83 82 84 83  Resp:  Temp:      TempSrc:      SpO2: 100% 100% 100% 100%  Weight:      Height:        Examination:  General exam: Pleasant young male, moderately built and thinly nourished, lying comfortably supine in bed. Oral mucosa dry. Respiratory system: Clear to auscultation. Respiratory effort normal. Cardiovascular system: S1 & S2 heard, RRR. No JVD, murmurs, rubs, gallops or clicks. No pedal edema. Telemetry: Sinus rhythm. Gastrointestinal system: Abdomen is nondistended, soft and nontender. No organomegaly or masses felt. Normal bowel sounds heard. Central nervous system: Alert and oriented. No focal neurological deficits. Extremities: Symmetric 5 x 5 power. Skin: No rashes, lesions or ulcers Psychiatry: Judgement and insight appear normal. Mood & affect flat.     Data Reviewed: I have personally reviewed following labs and imaging studies  CBC:  Recent Labs Lab 12/04/16 2156 12/05/16 0105 12/05/16 0231 12/05/16 0943  WBC 16.7*  --   --  8.8  HGB 17.8* 16.7 16.3 12.9*  HCT 48.7 49.0 48.0 34.9*  MCV 83.7  --   --  81.4  PLT 212  --   --  125*   Basic Metabolic Panel:  Recent Labs Lab 12/04/16 2156 12/05/16 0105 12/05/16 0208 12/05/16 0231 12/05/16 0535 12/05/16 0943  NA 129* 128* 133* 132* 131* 132*  K 3.3* 2.9* 3.5 3.5 3.1* 2.8*  CL 83* 88* 94* 96* 100* 103  CO2 10*  --  11*  --  13* 18*  GLUCOSE 494* 485* 397* 381* 213* 168*  BUN 23* 25* 23* 23* 17 14  CREATININE 1.97* 0.80 1.79* 0.70 1.42* 1.10  CALCIUM 9.6  --  8.4*  --  8.0* 8.0*  MG  --   --  1.6*  --   --   --    Liver Function Tests:  Recent Labs Lab 12/04/16 2156  AST 34  ALT 51  ALKPHOS 114  BILITOT 2.6*  PROT 7.7  ALBUMIN 4.3   HbA1C: No results for input(s): HGBA1C in the last 72 hours. CBG:  Recent Labs Lab 12/05/16 0613 12/05/16 0734 12/05/16 0845 12/05/16 0955 12/05/16 1059  GLUCAP 173* 174* 156* 170* 157*          Radiology Studies: No results found.      Scheduled  Meds: . ARIPiprazole  5 mg Oral Daily  . carvedilol  12.5 mg Oral BID  . DULoxetine  30 mg Oral BID  . enoxaparin (LOVENOX) injection  40 mg Subcutaneous Q24H  . escitalopram  10 mg Oral BID  . fenofibrate  160 mg Oral Daily  . gabapentin  600 mg Oral TID  . nicotine  21 mg Transdermal Daily  . pantoprazole  40 mg Oral Daily  . potassium chloride  40 mEq Oral Q4H  . traZODone  100 mg Oral QHS   Continuous Infusions: . sodium chloride    . sodium chloride 125 mL/hr at 12/05/16 0253  . dextrose 5 % and 0.45% NaCl 125 mL/hr at 12/05/16 0617  . insulin (NOVOLIN-R) infusion 3.3 Units/hr (12/05/16 0956)  . magnesium sulfate 1 - 4 g bolus IVPB       LOS: 0 days     Merry Pond, MD, FACP, FHM. [facility] Pager [phone]  If 7PM-7AM, please contact night-coverage [email or web address] Password [email or web address] 12/05/2016, 11:25 AM

## 2016-12-06 ENCOUNTER — Encounter (HOSPITAL_COMMUNITY): Payer: Self-pay | Admitting: *Deleted

## 2016-12-06 LAB — BASIC METABOLIC PANEL
Anion gap: 10 (ref 5–15)
BUN: 6 mg/dL (ref 6–20)
CALCIUM: 8.2 mg/dL — AB (ref 8.9–10.3)
CO2: 20 mmol/L — ABNORMAL LOW (ref 22–32)
CREATININE: 0.87 mg/dL (ref 0.61–1.24)
Chloride: 103 mmol/L (ref 101–111)
GFR calc non Af Amer: >60 mL/min (ref >60)
Glucose, Bld: 300 mg/dL — ABNORMAL HIGH (ref 65–99)
Potassium: 3.2 mmol/L — ABNORMAL LOW (ref 3.5–5.1)
SODIUM: 133 mmol/L — AB (ref 135–145)

## 2016-12-06 LAB — CBC
HCT: 35.8 % — ABNORMAL LOW (ref 39.0–52.0)
Hemoglobin: 12.9 g/dL — ABNORMAL LOW (ref 13.0–17.0)
MCH: 29.5 pg (ref 26.0–34.0)
MCHC: 36 g/dL (ref 30.0–36.0)
MCV: 81.9 fL (ref 78.0–100.0)
Platelets: 93 10*3/uL — ABNORMAL LOW (ref 150–400)
RBC: 4.37 MIL/uL (ref 4.22–5.81)
RDW: 12 % (ref 11.5–15.5)
WBC: 6.9 10*3/uL (ref 4.0–10.5)

## 2016-12-06 LAB — GLUCOSE, CAPILLARY
GLUCOSE-CAPILLARY: 233 mg/dL — AB (ref 65–99)
Glucose-Capillary: 293 mg/dL — ABNORMAL HIGH (ref 65–99)
Glucose-Capillary: 312 mg/dL — ABNORMAL HIGH (ref 65–99)
Glucose-Capillary: 278 mg/dL — ABNORMAL HIGH (ref 65–99)
Glucose-Capillary: 300 mg/dL — ABNORMAL HIGH (ref 65–99)
Glucose-Capillary: 313 mg/dL — ABNORMAL HIGH (ref 65–99)

## 2016-12-06 LAB — URINE CULTURE

## 2016-12-06 LAB — MAGNESIUM: MAGNESIUM: 1.6 mg/dL — AB (ref 1.7–2.4)

## 2016-12-06 MED ORDER — INSULIN GLARGINE 100 UNIT/ML ~~LOC~~ SOLN
30.0000 [IU] | Freq: Two times a day (BID) | SUBCUTANEOUS | Status: DC
  Administered 2016-12-06 (×2): 30 [IU] via SUBCUTANEOUS
  Filled 2016-12-06 (×3): qty 0.3

## 2016-12-06 MED ORDER — POTASSIUM CHLORIDE CRYS ER 20 MEQ PO TBCR
40.0000 meq | EXTENDED_RELEASE_TABLET | ORAL | Status: DC
  Administered 2016-12-06: 40 meq via ORAL
  Filled 2016-12-06: qty 2

## 2016-12-06 MED ORDER — PNEUMOCOCCAL VAC POLYVALENT 25 MCG/0.5ML IJ INJ
0.5000 mL | INJECTION | INTRAMUSCULAR | Status: AC
  Administered 2016-12-08: 0.5 mL via INTRAMUSCULAR
  Filled 2016-12-06: qty 0.5

## 2016-12-06 MED ORDER — SODIUM CHLORIDE 0.9 % IV SOLN
INTRAVENOUS | Status: AC
  Administered 2016-12-06 (×2): via INTRAVENOUS
  Filled 2016-12-06 (×3): qty 1000

## 2016-12-06 MED ORDER — MAGNESIUM SULFATE 2 GM/50ML IV SOLN
2.0000 g | Freq: Once | INTRAVENOUS | Status: AC
  Administered 2016-12-06: 2 g via INTRAVENOUS
  Filled 2016-12-06: qty 50

## 2016-12-06 MED ORDER — INSULIN ASPART 100 UNIT/ML ~~LOC~~ SOLN
3.0000 [IU] | Freq: Three times a day (TID) | SUBCUTANEOUS | Status: DC
  Administered 2016-12-06 (×2): 3 [IU] via SUBCUTANEOUS

## 2016-12-06 MED ORDER — INSULIN ASPART 100 UNIT/ML ~~LOC~~ SOLN
0.0000 [IU] | Freq: Three times a day (TID) | SUBCUTANEOUS | Status: DC
  Administered 2016-12-06 (×3): 8 [IU] via SUBCUTANEOUS
  Administered 2016-12-07: 5 [IU] via SUBCUTANEOUS
  Administered 2016-12-07: 2 [IU] via SUBCUTANEOUS
  Administered 2016-12-07: 5 [IU] via SUBCUTANEOUS
  Administered 2016-12-08: 2 [IU] via SUBCUTANEOUS
  Administered 2016-12-09 (×2): 3 [IU] via SUBCUTANEOUS

## 2016-12-06 MED ORDER — POTASSIUM CHLORIDE 20 MEQ/15ML (10%) PO SOLN
40.0000 meq | Freq: Once | ORAL | Status: AC
  Administered 2016-12-06: 40 meq via ORAL
  Filled 2016-12-06: qty 30

## 2016-12-06 MED ORDER — INSULIN ASPART 100 UNIT/ML ~~LOC~~ SOLN
0.0000 [IU] | Freq: Every day | SUBCUTANEOUS | Status: DC
  Administered 2016-12-06: 2 [IU] via SUBCUTANEOUS

## 2016-12-06 NOTE — Progress Notes (Signed)
PROGRESS NOTE   Terry Schultz  RUE:[id number]    DOB: Aug 07, 1970    DOA: 12/04/2016  PCP: Cheron Schaumann., MD   I have briefly reviewed patients previous medical records in [facility].  Brief Narrative:  46 year old male with PMH of type II DM/IDDM with neuropathy, HTN, HLD, hypothyroid, anxiety, depression, GERD, alcohol abuse set to be in remission, tobacco abuse, pancreatitis presented to ED with 3 days history of intractable nausea, nonbloody emesis and 2-3 episodes of loose stools daily, ongoing heartburn, admitted with diagnosis of DKA, dehydration, acute kidney injury. No recent antibiotic use. Hospitalized a month ago. DKA resolved. Ongoing diarrhea and associated electrolyte abnormality.   Assessment & Plan:   Principal Problem:   DKA (diabetic ketoacidoses) (HCC) Active Problems:   Essential hypertension   Uncontrolled type 2 diabetes mellitus without complication, with long-term current use of insulin (HCC)   Diabetes mellitus without complication (HCC)   Alcoholism (HCC)   Nausea vomiting and diarrhea   Depression with anxiety   HLD (hyperlipidemia)   AKI (acute kidney injury) (HCC)   Chronic back pain   SIRS (systemic inflammatory response syndrome) (HCC)   1. DKA type II DM, not at goal: On initial arrival, blood sugar 494, bicarbonate 10 and anion gap 36. Reports average blood sugars in the 180s at home when he checks it twice daily. Hemoglobin A1c: 10.8. Treated per DKA protocol with aggressive IV fluid hydration, insulin drip, frequent BMP checks. DKA resolved and patient was transitioned to Lantus and SSI on 9/8. Uncontrolled CBGs. Increased Lantus to home dose 30 units twice a day, changed SSI to moderate sensitivity and will add NovoLog 3 units 3 times a day with meals. Monitor closely. 2. Diarrhea and intractable nausea and vomiting: DD: Due to DKA, acute viral GE, diabetic gastroparesis. Denies sick contacts with similar complaints or recent antibiotic  use. Treating supportively with bowel rest, IV fluids, antiemetics and PPIs. No further vomiting. Having multiple diarrhea but loose stools and has substance in it. Continue to monitor. If has persistent diarrhea or if it changes to watery stools, consider checking C. difficile and GI pathogen panel PCR. 3. Dehydration with hyponatremia: Secondary to GI losses and poor oral intake. IV fluids and follow. Hyponatremia is related to dehydration and hyperglycemia/pseudohyponatremia. Improving. 4. Acute kidney injury: Creatinine 1.97 on admission. Secondary to dehydration and NSAIDs. Hold NSAIDs. Hydrated with IV fluids. Resolved. Continue to monitor BMPs. 5. Essential hypertension: Controlled. Continue carvedilol. 6. Hypokalemia: Replace aggressively and follow. 7. Hypomagnesemia: Replace and follow. 8. Anxiety and depression: Stable. Continue Abilify, duloxetine, trazodone and Lexapro. 9. Hyperlipidemia: Continue fenofibrate. 10. GERD: Continue PPI. 11. Tobacco abuse: Cessation counseled. Continue nicotine patch. 12. History of alcohol abuse: Said to be in remission and apparently has not had a drink since 09/11/16. 13. Chronic back pain: Controlled. Continue pain management. 14. SIRS: likely secondary to DKA rather than infectious etiology. Resolved. 15. Thrombocytopenia: Possibly from acute illness. Has had intermittent thrombocytopenia in the past upon chart review. Presented with platelets of 212, this has dropped to 125 and then 93. DC Lovenox. Follow CBC tomorrow and may need to consider further workup if continues to drop.  DVT prophylaxis: Lovenox-discontinued. SCDs. Code Status: Full Family Communication: None at bedside Disposition: Admitted to stepdown unit. DC home when medically improved.   Consultants:  None   Procedures:  None  Antimicrobials:  None    Subjective: Intermittent nausea but no further vomiting. As per RN, ate all of his breakfast. Reports  5 episodes of loose  stools in the last 24 hours. No abdominal pain.  ROS: No dizziness, lightheadedness. Reports not having slept well over the last 2 nights.  Objective:  Vitals:   12/05/16 2151 12/06/16 0058 12/06/16 0423 12/06/16 0800  BP: 114/80 110/75 110/76 108/78  Pulse: 60 70 65   Resp:  Temp:  98.6 F (37 C) 98.5 F (36.9 C) 98 F (36.7 C)  TempSrc:  Oral Oral   SpO2:  98% 99% 100%  Weight:      Height:        Examination:  General exam: Pleasant young male, moderately built and thinly nourished, lying comfortably supine in bed. Oral mucosa dry. Respiratory system: Clear to auscultation. Respiratory effort normal. Stable Cardiovascular system: S1 & S2 heard, RRR. No JVD, murmurs, rubs, gallops or clicks. No pedal edema. Telemetry: Sinus rhythm. Gastrointestinal system: Abdomen is nondistended, soft and nontender. No organomegaly or masses felt. Normal bowel sounds heard. Stable Central nervous system: Alert and oriented. No focal neurological deficits. Stable Extremities: Symmetric 5 x 5 power. Skin: No rashes, lesions or ulcers Psychiatry: Judgement and insight appear normal. Mood & affect flat.     Data Reviewed: I have personally reviewed following labs and imaging studies  CBC:  Recent Labs Lab 12/04/16 2156 12/05/16 0105 12/05/16 0231 12/05/16 0943 12/06/16 0300  WBC 16.7*  --   --  8.8 6.9  HGB 17.8* 16.7 16.3 12.9* 12.9*  HCT 48.7 49.0 48.0 34.9* 35.8*  MCV 83.7  --   --  81.4 81.9  PLT 212  --   --  125* 93*   Basic Metabolic Panel:  Recent Labs Lab 12/05/16 0208 12/05/16 0231 12/05/16 0535 12/05/16 0943 12/05/16 1418 12/06/16 0300  NA 133* 132* 131* 132* 134* 133*  K 3.5 3.5 3.1* 2.8* 2.8* 3.2*  CL 94* 96* 100* 103 105 103  CO2 11*  --  13* 18* 20* 20*  GLUCOSE 397* 381* 213* 168* 148* 300*  BUN 23* 23* CREATININE 1.79* 0.70 1.42* 1.10 0.83 0.87  CALCIUM 8.4*  --  8.0* 8.0* 8.1* 8.2*  MG 1.6*  --   --   --   --  1.6*   Liver  Function Tests:  Recent Labs Lab 12/04/16 2156  AST 34  ALT 51  ALKPHOS 114  BILITOT 2.6*  PROT 7.7  ALBUMIN 4.3   HbA1C:  Recent Labs  12/05/16 1130  HGBA1C 10.8*   CBG:  Recent Labs Lab 12/05/16 1818 12/05/16 2151 12/06/16 0216 12/06/16 0704 12/06/16 0901  GLUCAP 219* 322* 293* 312* 278*          Radiology Studies: No results found.      Scheduled Meds: . ARIPiprazole  5 mg Oral Daily  . carvedilol  12.5 mg Oral BID  . DULoxetine  30 mg Oral BID  . enoxaparin (LOVENOX) injection  40 mg Subcutaneous Q24H  . escitalopram  10 mg Oral BID  . fenofibrate  160 mg Oral Daily  . gabapentin  600 mg Oral TID  . insulin aspart  0-15 Units Subcutaneous TID WC  . insulin aspart  0-5 Units Subcutaneous QHS  . insulin glargine  30 Units Subcutaneous BID  . nicotine  21 mg Transdermal Daily  . pantoprazole  40 mg Oral Daily  . potassium chloride  40 mEq Oral Q4H  . traZODone  100 mg Oral QHS   Continuous Infusions: . sodium chloride 50 mL/hr  at 12/06/16 1000     LOS: 1 day     Shanan Mcmiller, MD, FACP, FHM. [facility] Pager [phone]  If 7PM-7AM, please contact night-coverage [email or web address] Password [email or web address] 12/06/2016, 10:49 AM

## 2016-12-07 ENCOUNTER — Encounter (HOSPITAL_COMMUNITY): Payer: Self-pay

## 2016-12-07 LAB — CBC
HEMATOCRIT: 34 % — AB (ref 39.0–52.0)
HEMOGLOBIN: 12.2 g/dL — AB (ref 13.0–17.0)
MCH: 30 pg (ref 26.0–34.0)
MCHC: 35.9 g/dL (ref 30.0–36.0)
MCV: 83.5 fL (ref 78.0–100.0)
Platelets: 84 10*3/uL — ABNORMAL LOW (ref 150–400)
RBC: 4.07 MIL/uL — AB (ref 4.22–5.81)
RDW: 12.3 % (ref 11.5–15.5)
WBC: 4.2 10*3/uL (ref 4.0–10.5)

## 2016-12-07 LAB — GLUCOSE, CAPILLARY
Glucose-Capillary: 215 mg/dL — ABNORMAL HIGH (ref 65–99)
Glucose-Capillary: 219 mg/dL — ABNORMAL HIGH (ref 65–99)
Glucose-Capillary: 133 mg/dL — ABNORMAL HIGH (ref 65–99)
Glucose-Capillary: 72 mg/dL (ref 65–99)

## 2016-12-07 LAB — BASIC METABOLIC PANEL
Anion gap: 4 — ABNORMAL LOW (ref 5–15)
CO2: 26 mmol/L (ref 22–32)
Calcium: 8.5 mg/dL — ABNORMAL LOW (ref 8.9–10.3)
Chloride: 106 mmol/L (ref 101–111)
Creatinine, Ser: 0.52 mg/dL — ABNORMAL LOW (ref 0.61–1.24)
GFR calc Af Amer: >60 mL/min (ref >60)
GLUCOSE: 252 mg/dL — AB (ref 65–99)
POTASSIUM: 3.3 mmol/L — AB (ref 3.5–5.1)
Sodium: 136 mmol/L (ref 135–145)

## 2016-12-07 LAB — MAGNESIUM: MAGNESIUM: 1.5 mg/dL — AB (ref 1.7–2.4)

## 2016-12-07 MED ORDER — MAGNESIUM SULFATE 4 GM/100ML IV SOLN
4.0000 g | Freq: Once | INTRAVENOUS | Status: AC
  Administered 2016-12-07: 4 g via INTRAVENOUS
  Filled 2016-12-07: qty 100

## 2016-12-07 MED ORDER — NICOTINE 14 MG/24HR TD PT24
14.0000 mg | MEDICATED_PATCH | Freq: Every day | TRANSDERMAL | Status: DC
  Administered 2016-12-08 – 2016-12-09 (×2): 14 mg via TRANSDERMAL
  Filled 2016-12-07 (×2): qty 1

## 2016-12-07 MED ORDER — SODIUM CHLORIDE 0.9 % IV SOLN
INTRAVENOUS | Status: AC
  Administered 2016-12-07: 11:00:00 via INTRAVENOUS
  Filled 2016-12-07 (×2): qty 1000

## 2016-12-07 MED ORDER — POTASSIUM CHLORIDE 20 MEQ/15ML (10%) PO SOLN
40.0000 meq | ORAL | Status: AC
  Administered 2016-12-07 (×2): 40 meq via ORAL
  Filled 2016-12-07 (×2): qty 30

## 2016-12-07 MED ORDER — INSULIN GLARGINE 100 UNIT/ML ~~LOC~~ SOLN
33.0000 [IU] | Freq: Two times a day (BID) | SUBCUTANEOUS | Status: DC
  Administered 2016-12-07 – 2016-12-08 (×3): 33 [IU] via SUBCUTANEOUS
  Filled 2016-12-07 (×3): qty 0.33

## 2016-12-07 MED ORDER — INSULIN ASPART 100 UNIT/ML ~~LOC~~ SOLN
5.0000 [IU] | Freq: Three times a day (TID) | SUBCUTANEOUS | Status: DC
  Administered 2016-12-07 – 2016-12-09 (×8): 5 [IU] via SUBCUTANEOUS

## 2016-12-07 NOTE — Progress Notes (Signed)
Patient was transferred from [facility]. Denies any pain at this time. Patient in bed resting comfortably. Will continue to monitor.

## 2016-12-07 NOTE — Progress Notes (Signed)
PROGRESS NOTE   Terry Schultz  ZOX:[id number]    DOB: [birth year]    DOA: 12/04/2016  PCP: Cheron SchaumannVelazquez, Gretchen Y., MD   I have briefly reviewed patients previous medical records in [facility].  Brief Narrative:  46 year old male with PMH of type II DM/IDDM with neuropathy, HTN, HLD, hypothyroid, anxiety, depression, GERD, alcohol abuse set to be in remission, tobacco abuse, pancreatitis presented to ED with 3 days history of intractable nausea, nonbloody emesis and 2-3 episodes of loose stools daily, ongoing heartburn, admitted with diagnosis of DKA, dehydration, acute kidney injury. No recent antibiotic use. Hospitalized a month ago. DKA resolved. Ongoing diarrhea and associated electrolyte abnormality.Diarrhea slowly improving. Adjusting insulin's. Transfer to medical bed 9/10. Possible DC home in the next 24 hours.   Assessment & Plan:   Principal Problem:   DKA (diabetic ketoacidoses) (HCC) Active Problems:   Essential hypertension   Uncontrolled type 2 diabetes mellitus without complication, with long-term current use of insulin (HCC)   Diabetes mellitus without complication (HCC)   Alcoholism (HCC)   Nausea vomiting and diarrhea   Depression with anxiety   HLD (hyperlipidemia)   AKI (acute kidney injury) (HCC)   Chronic back pain   SIRS (systemic inflammatory response syndrome) (HCC)   1. DKA type II DM, not at goal: On initial arrival, blood sugar 494, bicarbonate 10 and anion gap 36. Reports average blood sugars in the 180s at home when he checks it twice daily. Hemoglobin A1c: 10.8. ? Precipitated by acute GI illness. Treated per DKA protocol. DKA resolved and patient was transitioned to Lantus and SSI on 9/8. Adjusting insulins. Monitor closely. 2. Diarrhea and intractable nausea and vomiting: DD: Due to DKA, acute viral GE, diabetic gastroparesis. Denies sick contacts with similar complaints or recent antibiotic use. Treated supportively with bowel rest, IV fluids,  antiemetics and PPIs. Vomiting resolved. Tolerating diet. Diarrhea finally starting to improve, reports improving consistency but still having same frequency. If worsens, may need to consider testing for C. difficile and GI panel PCR. 3. Dehydration with hyponatremia: Secondary to GI losses and poor oral intake. Hyponatremia is related to dehydration and hyperglycemia/pseudohyponatremia. Hyponatremia resolved. Continue gentle IV fluids for additional 24 hours. 4. Acute kidney injury: Creatinine 1.97 on admission. Secondary to dehydration and NSAIDs. Hold NSAIDs. Hydrated with IV fluids. Resolved.  5. Essential hypertension: Controlled. Continue carvedilol. 6. Hypokalemia: Replace aggressively and follow. 7. Hypomagnesemia: Replaced IV yesterday. Follow BMP. 8. Anxiety and depression: Stable. Continue Abilify, duloxetine, trazodone and Lexapro. 9. Hyperlipidemia: Continue fenofibrate. 10. GERD: Continue PPI. 11. Tobacco abuse: Cessation counseled. Continue nicotine patch. 12. History of alcohol abuse: Said to be in remission and apparently has not had a drink since 09/11/16. 13. Chronic back pain: Controlled. Continue pain management. 14. SIRS: likely secondary to DKA rather than infectious etiology. Resolved. 15. Thrombocytopenia: Possibly from acute illness. Has had intermittent thrombocytopenia in the past upon chart review. Presented with platelets of 212, this has dropped to 125>93>84. DC'ed Lovenox. Seems to have stabilized. If continues to drop, consider hematology consultation.  DVT prophylaxis: Lovenox-discontinued. SCDs. Code Status: Full Family Communication: None at bedside Disposition: Admitted to stepdown unit. Transferred to medical bed 9/10. DC home when medically improved. Hopefully in the next 1-2 days.   Consultants:  None   Procedures:  None  Antimicrobials:  None    Subjective: No vomiting. Tolerating diet. Still feeling weak. Reports that stool consistency is  improving but not much change in frequency. No fever or chills or abdominal  pain.    ROS: No dizziness, lightheadedness. Reports not having slept well over the last 2 nights.  Objective:  Vitals:   12/06/16 2102 12/06/16 2321 12/07/16 0339 12/07/16 0756  BP:   Pulse: 71 68 60 75  Resp:  13 10   Temp:  98.3 F (36.8 C) 98.3 F (36.8 C) 98.2 F (36.8 C)  TempSrc:  Oral Oral Oral  SpO2:  99% 100% 99%  Weight:      Height:        Examination:  General exam: Pleasant young male, moderately built and thinly nourished, sitting up on BSC. Respiratory system: Clear to auscultation. Respiratory effort normal. Stable Cardiovascular system: S1 & S2 heard, RRR. No JVD, murmurs, rubs, gallops or clicks. No pedal edema. Telemetry: Sinus rhythm. Stable Gastrointestinal system: Abdomen is nondistended, soft and nontender. No organomegaly or masses felt. Normal bowel sounds heard. Stable Central nervous system: Alert and oriented. No focal neurological deficits. Stable Extremities: Symmetric 5 x 5 power. Skin: No rashes, lesions or ulcers Psychiatry: Judgement and insight appear normal. Mood & affect flat.     Data Reviewed: I have personally reviewed following labs and imaging studies  CBC:  Recent Labs Lab 12/04/16 2156 12/05/16 0105 12/05/16 0231 12/05/16 0943 12/06/16 0300 12/07/16 0341  WBC 16.7*  --   --  8.8 6.9 4.2  HGB 17.8* 16.7 16.3 12.9* 12.9* 12.2*  HCT 48.7 49.0 48.0 34.9* 35.8* 34.0*  MCV 83.7  --   --  81.4 81.9 83.5  PLT 212  --   --  125* 93* 84*   Basic Metabolic Panel:  Recent Labs Lab 12/05/16 0208  12/05/16 0535 12/05/16 0943 12/05/16 1418 12/06/16 0300 12/07/16 0341  NA 133*  < > 131* 132* 134* 133* 136  K 3.5  < > 3.1* 2.8* 2.8* 3.2* 3.3*  CL 94*  < > 100* 103 105 103 106  CO2 11*  --  13* 18* 20* 20* 26  GLUCOSE 397*  < > 213* 168* 148* 300* 252*  BUN 23*  < > <5*  CREATININE 1.79*  < > 1.42* 1.10 0.83 0.87 0.52*    CALCIUM 8.4*  --  8.0* 8.0* 8.1* 8.2* 8.5*  MG 1.6*  --   --   --   --  1.6*  --   < > = values in this interval not displayed. Liver Function Tests:  Recent Labs Lab 12/04/16 2156  AST 34  ALT 51  ALKPHOS 114  BILITOT 2.6*  PROT 7.7  ALBUMIN 4.3   HbA1C:  Recent Labs  12/05/16 1130  HGBA1C 10.8*   CBG:  Recent Labs Lab 12/06/16 0901 12/06/16 1320 12/06/16 1613 12/06/16 2101 12/07/16 0826  GLUCAP 278* 300* 313* 233* 215*          Radiology Studies: No results found.      Scheduled Meds: . ARIPiprazole  5 mg Oral Daily  . carvedilol  12.5 mg Oral BID  . DULoxetine  30 mg Oral BID  . escitalopram  10 mg Oral BID  . fenofibrate  160 mg Oral Daily  . gabapentin  600 mg Oral TID  . insulin aspart  0-15 Units Subcutaneous TID WC  . insulin aspart  0-5 Units Subcutaneous QHS  . insulin aspart  5 Units Subcutaneous TID WC  . insulin glargine  33 Units Subcutaneous BID  . pantoprazole  40 mg Oral Daily  . pneumococcal 23 valent vaccine  0.5  mL Intramuscular Tomorrow-1000  . potassium chloride  40 mEq Oral Q4H  . traZODone  100 mg Oral QHS   Continuous Infusions: . sodium chloride 0.9 % 1,000 mL with potassium chloride 40 mEq infusion       LOS: 2 days     Theran Vandergrift, MD, FACP, FHM. [facility] Pager [phone]  If 7PM-7AM, please contact night-coverage [email or web address] Password [email or web address] 12/07/2016, 11:08 AM

## 2016-12-08 ENCOUNTER — Encounter (HOSPITAL_COMMUNITY): Payer: Self-pay | Admitting: *Deleted

## 2016-12-08 LAB — BASIC METABOLIC PANEL
ANION GAP: 4 — AB (ref 5–15)
BUN: <5 mg/dL — ABNORMAL LOW (ref 6–20)
CALCIUM: 8.7 mg/dL — AB (ref 8.9–10.3)
CO2: 31 mmol/L (ref 22–32)
Chloride: 103 mmol/L (ref 101–111)
Creatinine, Ser: 0.67 mg/dL (ref 0.61–1.24)
GLUCOSE: 137 mg/dL — AB (ref 65–99)
POTASSIUM: 3.5 mmol/L (ref 3.5–5.1)
Sodium: 138 mmol/L (ref 135–145)

## 2016-12-08 LAB — CBC
HEMATOCRIT: 37.8 % — AB (ref 39.0–52.0)
Hemoglobin: 13 g/dL (ref 13.0–17.0)
MCH: 29.7 pg (ref 26.0–34.0)
MCHC: 34.4 g/dL (ref 30.0–36.0)
MCV: 86.3 fL (ref 78.0–100.0)
PLATELETS: 90 10*3/uL — AB (ref 150–400)
RBC: 4.38 MIL/uL (ref 4.22–5.81)
RDW: 12.4 % (ref 11.5–15.5)
WBC: 4.6 10*3/uL (ref 4.0–10.5)

## 2016-12-08 LAB — GLUCOSE, CAPILLARY
GLUCOSE-CAPILLARY: 139 mg/dL — AB (ref 65–99)
GLUCOSE-CAPILLARY: 89 mg/dL (ref 65–99)
GLUCOSE-CAPILLARY: 90 mg/dL (ref 65–99)
Glucose-Capillary: 136 mg/dL — ABNORMAL HIGH (ref 65–99)
Glucose-Capillary: 203 mg/dL — ABNORMAL HIGH (ref 65–99)

## 2016-12-08 LAB — MAGNESIUM: Magnesium: 1.8 mg/dL (ref 1.7–2.4)

## 2016-12-08 MED ORDER — POTASSIUM CHLORIDE CRYS ER 20 MEQ PO TBCR
40.0000 meq | EXTENDED_RELEASE_TABLET | Freq: Once | ORAL | Status: AC
  Administered 2016-12-08: 40 meq via ORAL
  Filled 2016-12-08: qty 2

## 2016-12-08 MED ORDER — INSULIN GLARGINE 100 UNIT/ML ~~LOC~~ SOLN
30.0000 [IU] | Freq: Two times a day (BID) | SUBCUTANEOUS | Status: DC
  Administered 2016-12-08 – 2016-12-09 (×2): 30 [IU] via SUBCUTANEOUS
  Filled 2016-12-08 (×3): qty 0.3

## 2016-12-08 MED ORDER — SODIUM CHLORIDE 0.9 % IV BOLUS (SEPSIS)
1000.0000 mL | Freq: Once | INTRAVENOUS | Status: AC
  Administered 2016-12-08: 1000 mL via INTRAVENOUS

## 2016-12-08 MED ORDER — MAGNESIUM SULFATE 4 GM/100ML IV SOLN
4.0000 g | Freq: Once | INTRAVENOUS | Status: AC
  Administered 2016-12-08: 4 g via INTRAVENOUS
  Filled 2016-12-08: qty 100

## 2016-12-08 MED ORDER — LOPERAMIDE HCL 2 MG PO CAPS: 2.0000 mg | ORAL_CAPSULE | ORAL | Status: DC | PRN

## 2016-12-08 MED ORDER — LOPERAMIDE HCL 2 MG PO CAPS
4.0000 mg | ORAL_CAPSULE | Freq: Once | ORAL | Status: AC
  Administered 2016-12-08: 4 mg via ORAL
  Filled 2016-12-08: qty 2

## 2016-12-08 NOTE — Progress Notes (Signed)
PROGRESS NOTE    Terry Schultz  ZOX:[id number] DOB: [birth year] DOA: 12/04/2016 PCP: Cheron Schaumann., MD    Brief Narrative:   46 year old male with PMH of type II DM/IDDM with neuropathy, HTN, HLD, hypothyroid, anxiety, depression, GERD, alcohol abuse set to be in remission, tobacco abuse, pancreatitis presented to ED with 3 days history of intractable nausea, nonbloody emesis and 2-3 episodes of loose stools daily, ongoing heartburn, admitted with diagnosis of DKA, dehydration, acute kidney injury. No recent antibiotic use. Hospitalized a month ago. DKA resolved. Ongoing diarrhea and associated electrolyte abnormality.Diarrhea slowly improving. Adjusting insulin's. Transfer to medical bed 9/10. Possible DC home in the next 24 hours.  Assessment & Plan:   Principal Problem:   DKA (diabetic ketoacidoses) (HCC) Active Problems:   Essential hypertension   Uncontrolled type 2 diabetes mellitus without complication, with long-term current use of insulin (HCC)   Diabetes mellitus without complication (HCC)   Alcoholism (HCC)   Nausea vomiting and diarrhea   Depression with anxiety   HLD (hyperlipidemia)   AKI (acute kidney injury) (HCC)   Chronic back pain   SIRS (systemic inflammatory response syndrome) (HCC)   1. DKA type II DM, not at goal: On initial arrival, blood sugar 494, bicarbonate 10 and anion gap 36. Reports average blood sugars in the 180s at home when he checks it twice daily. Hemoglobin A1c: 10.8. ? Precipitated by acute GI illness. Treated per DKA protocol. DKA resolved and patient was transitioned to Lantus and SSI on 9/8. Decrease Lantus to 30 units  BID. Continue SSI. Monitor closely. 2. Diarrhea and intractable nausea and vomiting: DD: Due to DKA, acute viral GE, diabetic gastroparesis. Denied sick contacts with similar complaints or recent antibiotic use. Treated supportively with bowel rest, IV fluids, antiemetics and PPIs. Vomiting resolved. Tolerating diet.  Diarrhea improving however patient would 5 soft mushy stools today. Will give a dose of Imodium. Follow.  3. Dehydration with hyponatremia: Secondary to GI losses and poor oral intake. Hyponatremia secondary to dehydration and hyperglycemia/pseudohyponatremia. Hyponatremia resolved. Continue gentle IV fluids for additional 24 hours. 4. Acute kidney injury: Creatinine 1.97 on admission. Secondary to dehydration and NSAIDs. Continue holding NSAIDs. Hydrated with IV fluids. Resolved.  5. Essential hypertension: Controlled. Continue carvedilol. 6. Hypokalemia: Replace aggressively and follow. 7. Hypomagnesemia: Replaced IV yesterday. Follow BMP. 8. Anxiety and depression: Stable. Continue home regimen of Abilify, duloxetine, trazodone and Lexapro. 9. Hyperlipidemia: Continue fenofibrate. 10. GERD: Continue PPI. 11. Tobacco abuse: Cessation counseled. Continue nicotine patch. 12. History of alcohol abuse: Said to be in remission and apparently has not had a drink since 09/11/16. No signs of withdrawal. Monitor. 13. Chronic back pain:  patient was maintained on home regimen of pain medications.  14. SIRS: likely secondary to DKA rather than infectious etiology. Resolved. 15. Thrombocytopenia: Possibly from acute illness. Has had intermittent thrombocytopenia in the past upon chart review. Presented with platelets of 212, this has dropped to 125>93>84. DVT prophylaxis was subsequently discontinued. Seems to have stabilized. If continues to drop, consider hematology consultation.    DVT prophylaxis: SCDs Code Status: Full Family Communication: updated patient. No family at bedside Dis .position Plan: Home when medically stable.   Consultants:   None  Procedures:   None  Antimicrobials:   None   Subjective: Feeling dizzy. 5 loose mushy stools this morning. Patient denies any chest pain. No shortness of breath. Patient noted to have low CBG this morning.   Objective: Vitals:   12/07/16  1700 12/07/16 2045 12/08/16  0524 12/08/16 0941  BP: 93/65 108/76 111/77 108/80  Pulse: 76 74 75 72  Resp: Temp: 97.8 F (36.6 C) 98.2 F (36.8 C) (!) 97.5 F (36.4 C) 98.2 F (36.8 C)  TempSrc: Oral Oral Oral Oral  SpO2: 100% 99% 99% 99%  Weight:  64.1 kg (141 lb 6.4 oz)    Height:        Intake/Output Summary (Last 24 hours) at 12/08/16 1207 Last data filed at 12/08/16 0830  Gross per 24 hour  Intake          1532.16 ml  Output                0 ml  Net          1532.16 ml   Filed Weights   12/04/16 2144 12/07/16 2045  Weight: 63.5 kg (140 lb) 64.1 kg (141 lb 6.4 oz)    Examination:  General exam: Appears calm and comfortable  Respiratory system: Clear to auscultation. Respiratory effort normal. Cardiovascular system: S1 & S2 heard, RRR. No JVD, murmurs, rubs, gallops or clicks. No pedal edema. Gastrointestinal system: Abdomen is nondistended, soft and nontender. No organomegaly or masses felt. Normal bowel sounds heard. Central nervous system: Alert and oriented. No focal neurological deficits. Extremities: Symmetric 5 x 5 power. Skin: No rashes, lesions or ulcers Psychiatry: Judgement and insight appear normal. Mood & affect appropriate.     Data Reviewed: I have personally reviewed following labs and imaging studies  CBC:  Recent Labs Lab 12/04/16 2156  12/05/16 0231 12/05/16 0943 12/06/16 0300 12/07/16 0341 12/08/16 0524  WBC 16.7*  --   --  8.8 6.9 4.2 4.6  HGB 17.8*  < > 16.3 12.9* 12.9* 12.2* 13.0  HCT 48.7  < > 48.0 34.9* 35.8* 34.0* 37.8*  MCV 83.7  --   --  81.4 81.9 83.5 86.3  PLT 212  --   --  125* 93* 84* 90*  < > = values in this interval not displayed. Basic Metabolic Panel:  Recent Labs Lab 12/05/16 0208  12/05/16 0943 12/05/16 1418 12/06/16 0300 12/07/16 0341 12/08/16 0524  NA 133*  < > 132* 134* 133* 136 138  K 3.5  < > 2.8* 2.8* 3.2* 3.3* 3.5  CL 94*  < > 103 105 103 106 103  CO2 11*  < > 18* 20* 20* 26 31    GLUCOSE 397*  < > 168* 148* 300* 252* 137*  BUN 23*  < > <5* <5*  CREATININE 1.79*  < > 1.10 0.83 0.87 0.52* 0.67  CALCIUM 8.4*  < > 8.0* 8.1* 8.2* 8.5* 8.7*  MG 1.6*  --   --   --  1.6* 1.5* 1.8  < > = values in this interval not displayed. GFR: Estimated Creatinine Clearance: 101.9 mL/min (by C-G formula based on SCr of 0.67 mg/dL). Liver Function Tests:  Recent Labs Lab 12/04/16 2156  AST 34  ALT 51  ALKPHOS 114  BILITOT 2.6*  PROT 7.7  ALBUMIN 4.3    Recent Labs Lab 12/04/16 2156  LIPASE 18   No results for input(s): AMMONIA in the last 168 hours. Coagulation Profile: No results for input(s): INR, PROTIME in the last 168 hours. Cardiac Enzymes: No results for input(s): CKTOTAL, CKMB, CKMBINDEX, TROPONINI in the last 168 hours. BNP (last 3 results) No results for input(s): PROBNP in the last 8760 hours. HbA1C: No results for input(s): HGBA1C in the last  72 hours. CBG:  Recent Labs Lab 12/07/16 1225 12/07/16 1658 12/07/16 2044 12/08/16 0624 12/08/16 0802  GLUCAP 219* 133* 72 139* 89   Lipid Profile: No results for input(s): CHOL, HDL, LDLCALC, TRIG, CHOLHDL, LDLDIRECT in the last 72 hours. Thyroid Function Tests: No results for input(s): TSH, T4TOTAL, FREET4, T3FREE, THYROIDAB in the last 72 hours. Anemia Panel: No results for input(s): VITAMINB12, FOLATE, FERRITIN, TIBC, IRON, RETICCTPCT in the last 72 hours. Sepsis Labs:  Recent Labs Lab 12/05/16 0106 12/05/16 0234 12/05/16 0535 12/05/16 0943  PROCALCITON  --   --   --  0.17  LATICACIDVEN 1.60 2.19* 1.1  --     Recent Results (from the past 240 hour(s))  Culture, blood (Routine X 2) w Reflex to ID Panel     Status: None (Preliminary result)   Collection Time: 12/05/16  2:10 AM  Result Value Ref Range Status   Specimen Description BLOOD LEFT ANTECUBITAL  Final   Special Requests   Final    BOTTLES DRAWN AEROBIC AND ANAEROBIC Blood Culture adequate volume   Culture NO GROWTH 3 DAYS   Final   Report Status PENDING  Incomplete  Culture, blood (Routine X 2) w Reflex to ID Panel     Status: None (Preliminary result)   Collection Time: 12/05/16  2:24 AM  Result Value Ref Range Status   Specimen Description BLOOD RIGHT HAND  Final   Special Requests   Final    BOTTLES DRAWN AEROBIC ONLY Blood Culture adequate volume   Culture NO GROWTH 3 DAYS  Final   Report Status PENDING  Incomplete  Urine Culture     Status: Abnormal   Collection Time: 12/05/16  3:45 AM  Result Value Ref Range Status   Specimen Description URINE, RANDOM  Final   Special Requests NONE  Final   Culture <10,000 COLONIES/mL INSIGNIFICANT GROWTH (A)  Final   Report Status 12/06/2016 FINAL  Final  MRSA PCR Screening     Status: None   Collection Time: 12/05/16  5:22 PM  Result Value Ref Range Status   MRSA by PCR NEGATIVE NEGATIVE Final    Comment:        The GeneXpert MRSA Assay (FDA approved for NASAL specimens only), is one component of a comprehensive MRSA colonization surveillance program. It is not intended to diagnose MRSA infection nor to guide or monitor treatment for MRSA infections.          Radiology Studies: No results found.      Scheduled Meds: . ARIPiprazole  5 mg Oral Daily  . carvedilol  12.5 mg Oral BID  . DULoxetine  30 mg Oral BID  . escitalopram  10 mg Oral BID  . fenofibrate  160 mg Oral Daily  . gabapentin  600 mg Oral TID  . insulin aspart  0-15 Units Subcutaneous TID WC  . insulin aspart  0-5 Units Subcutaneous QHS  . insulin aspart  5 Units Subcutaneous TID WC  . insulin glargine  30 Units Subcutaneous BID  . loperamide  4 mg Oral Once  . nicotine  14 mg Transdermal Daily  . pantoprazole  40 mg Oral Daily  . pneumococcal 23 valent vaccine  0.5 mL Intramuscular Tomorrow-1000  . traZODone  100 mg Oral QHS   Continuous Infusions: . magnesium sulfate 1 - 4 g bolus IVPB 4 g (12/08/16 1034)  . sodium chloride       LOS: 3 days    Time spent: 35  minutes  Terry Schultz,Terry Belshe, MD [facility] Pager [phone]  If 7PM-7AM, please contact night-coverage [email or web address] Password [email or web address] 12/08/2016, 12:07 PM

## 2016-12-09 DIAGNOSIS — F418 Other specified anxiety disorders: Secondary | ICD-10-CM

## 2016-12-09 DIAGNOSIS — R112 Nausea with vomiting, unspecified: Secondary | ICD-10-CM

## 2016-12-09 DIAGNOSIS — E1110 Type 2 diabetes mellitus with ketoacidosis without coma: Principal | ICD-10-CM

## 2016-12-09 DIAGNOSIS — E1165 Type 2 diabetes mellitus with hyperglycemia: Secondary | ICD-10-CM

## 2016-12-09 DIAGNOSIS — M549 Dorsalgia, unspecified: Secondary | ICD-10-CM

## 2016-12-09 DIAGNOSIS — N179 Acute kidney failure, unspecified: Secondary | ICD-10-CM

## 2016-12-09 DIAGNOSIS — E785 Hyperlipidemia, unspecified: Secondary | ICD-10-CM

## 2016-12-09 DIAGNOSIS — R6510 Systemic inflammatory response syndrome (SIRS) of non-infectious origin without acute organ dysfunction: Secondary | ICD-10-CM

## 2016-12-09 DIAGNOSIS — Z794 Long term (current) use of insulin: Secondary | ICD-10-CM

## 2016-12-09 DIAGNOSIS — R197 Diarrhea, unspecified: Secondary | ICD-10-CM

## 2016-12-09 DIAGNOSIS — E876 Hypokalemia: Secondary | ICD-10-CM

## 2016-12-09 DIAGNOSIS — G8929 Other chronic pain: Secondary | ICD-10-CM

## 2016-12-09 DIAGNOSIS — I10 Essential (primary) hypertension: Secondary | ICD-10-CM

## 2016-12-09 DIAGNOSIS — F1020 Alcohol dependence, uncomplicated: Secondary | ICD-10-CM

## 2016-12-09 DIAGNOSIS — E1310 Other specified diabetes mellitus with ketoacidosis without coma: Secondary | ICD-10-CM

## 2016-12-09 LAB — CBC WITH DIFFERENTIAL/PLATELET
BASOS PCT: 0 %
Basophils Absolute: 0 10*3/uL (ref 0.0–0.1)
Eosinophils Absolute: 0.1 10*3/uL (ref 0.0–0.7)
Eosinophils Relative: 1 %
HEMATOCRIT: 36.5 % — AB (ref 39.0–52.0)
Hemoglobin: 12.4 g/dL — ABNORMAL LOW (ref 13.0–17.0)
Lymphocytes Relative: 25 %
Lymphs Abs: 1.3 10*3/uL (ref 0.7–4.0)
MCH: 29.3 pg (ref 26.0–34.0)
MCHC: 34 g/dL (ref 30.0–36.0)
MCV: 86.3 fL (ref 78.0–100.0)
MONO ABS: 0.4 10*3/uL (ref 0.1–1.0)
MONOS PCT: 8 %
Neutro Abs: 3.5 10*3/uL (ref 1.7–7.7)
Neutrophils Relative %: 66 %
Platelets: 97 10*3/uL — ABNORMAL LOW (ref 150–400)
RBC: 4.23 MIL/uL (ref 4.22–5.81)
RDW: 12.3 % (ref 11.5–15.5)
WBC: 5.3 10*3/uL (ref 4.0–10.5)

## 2016-12-09 LAB — BASIC METABOLIC PANEL
Anion gap: 6 (ref 5–15)
CALCIUM: 8.6 mg/dL — AB (ref 8.9–10.3)
CO2: 30 mmol/L (ref 22–32)
Chloride: 102 mmol/L (ref 101–111)
Creatinine, Ser: 0.58 mg/dL — ABNORMAL LOW (ref 0.61–1.24)
GFR calc Af Amer: >60 mL/min (ref >60)
GLUCOSE: 119 mg/dL — AB (ref 65–99)
Potassium: 3.2 mmol/L — ABNORMAL LOW (ref 3.5–5.1)
Sodium: 138 mmol/L (ref 135–145)

## 2016-12-09 LAB — GLUCOSE, CAPILLARY
Glucose-Capillary: 160 mg/dL — ABNORMAL HIGH (ref 65–99)
Glucose-Capillary: 181 mg/dL — ABNORMAL HIGH (ref 65–99)

## 2016-12-09 LAB — MAGNESIUM: MAGNESIUM: 1.6 mg/dL — AB (ref 1.7–2.4)

## 2016-12-09 MED ORDER — POTASSIUM CHLORIDE CRYS ER 20 MEQ PO TBCR
40.0000 meq | EXTENDED_RELEASE_TABLET | Freq: Two times a day (BID) | ORAL | Status: DC
  Administered 2016-12-09: 40 meq via ORAL
  Filled 2016-12-09: qty 2

## 2016-12-09 MED ORDER — MAGNESIUM SULFATE 2 GM/50ML IV SOLN
2.0000 g | Freq: Once | INTRAVENOUS | Status: AC
  Administered 2016-12-09: 2 g via INTRAVENOUS
  Filled 2016-12-09: qty 50

## 2016-12-09 MED ORDER — NICOTINE 14 MG/24HR TD PT24: 14.0000 mg | MEDICATED_PATCH | Freq: Every day | TRANSDERMAL | 0 refills | Status: DC

## 2016-12-09 MED ORDER — INFLUENZA VAC SPLIT QUAD 0.5 ML IM SUSY
0.5000 mL | PREFILLED_SYRINGE | Freq: Once | INTRAMUSCULAR | Status: AC
  Administered 2016-12-09: 0.5 mL via INTRAMUSCULAR
  Filled 2016-12-09: qty 0.5

## 2016-12-09 MED ORDER — INFLUENZA VAC SPLIT QUAD 0.5 ML IM SUSY: 0.5000 mL | PREFILLED_SYRINGE | INTRAMUSCULAR | Status: DC

## 2016-12-09 NOTE — Discharge Summary (Signed)
Physician Discharge Summary  Terry Schultz AOZ:[id number] DOB: 07-21-1970 DOA: 12/04/2016  PCP: Cheron Schaumann., MD  Admit date: 12/04/2016 Discharge date: 12/09/2016  Admitted From: Home Disposition: Home  Recommendations for Outpatient Follow-up:  1. Follow up with PCP in 1-2 weeks; Have PCP adjust Insulin Regimen Further  2. Please obtain BMP/CBC, CMP, Mag in one week  Home Health: No Equipment/Devices: None  Discharge Condition: Stable  CODE STATUS: FULL CODE Diet recommendation: Heart Healthy Carb Modified Diet  Brief/Interim Summary: 46 year old male with PMH of type II DM/IDDM with neuropathy, HTN, HLD, hypothyroid, anxiety, depression, GERD, alcohol abuse set to be in remission, tobacco abuse, pancreatitis presented to ED with 3 days history of intractable nausea, nonbloody emesis and 2-3 episodes of loose stools daily, ongoing heartburn, admitted with diagnosis of DKA, dehydration, acute kidney injury. No recent antibiotic use. Hospitalized a month ago. DKA resolved. Ongoing diarrhea and associated electrolyte abnormality. Diarrhea improved. Adjusted insulin's. Transfer to medical bed 9/10. Patient felt better and was deemed medically stable to D/C Home and follow up with PCP within 1 week.   Discharge Diagnoses:  Principal Problem:   DKA (diabetic ketoacidoses) (HCC) Active Problems:   Essential hypertension   Uncontrolled type 2 diabetes mellitus without complication, with long-term current use of insulin (HCC)   Diabetes mellitus without complication (HCC)   Alcoholism (HCC)   Nausea vomiting and diarrhea   Depression with anxiety   HLD (hyperlipidemia)   AKI (acute kidney injury) (HCC)   Chronic back pain   SIRS (systemic inflammatory response syndrome) (HCC)  DKA type II DM, not at goal: On initial arrival, blood sugar 494, bicarbonate 10 and anion gap 36. Reports average blood sugars in the 180s at home when he checks it twice daily. Hemoglobin A1c: 10.8. ?  Precipitated by acute GI illness. Treated per DKA protocol. DKA resolved and patient was transitioned to Lantus and SSI on 9/8. Decrease Lantus to 30 units BID. Further Diabetic Medication adjustment per PCP.   Diarrhea and intractable nausea and vomiting:DD: Due to DKA, acute viral GE, diabetic gastroparesis. Denied sick contacts with similar complaints or recent antibiotic use. Treatedsupportively with bowel rest, IV fluids, antiemetics and PPIs. Vomiting resolved. Tolerating diet. Diarrhea improved after dose of Imodium. Continue to Monitor as an outpatient   Dehydration with hyponatremia, improved Secondary to GI losses and poor oral intake. Hyponatremia secondary to dehydration and hyperglycemia/pseudohyponatremia. Hyponatremia resolved. Was Fluid Rehydrated   Acute kidney injury:Creatinine 1.97 on admission. Secondary to dehydration and NSAIDs. Continue holding NSAIDs. Hydrated with IV fluids. Resolved as BUN/CR improved to <5/0.58  Essential hypertension:Controlled. Continue carvedilol.  Hypokalemia:Replaced prior to D/C. Repeat as an outpatient at PCP  Hypomagnesemia:Replaced IV Prior to D/C. Repeat as an outpatient .  Anxiety and depression:Stable. Continue home regimen of Abilify, duloxetine, trazodone and Lexapro.  Hyperlipidemia: Continue fenofibrate.  GERD:Continue PPI.  Tobacco abuse: Cessation counseled. Continue nicotine patch at D/C  History of alcohol abuse: Said to be in remission and apparently has not had a drink since 09/11/16. No signs of withdrawal. Monitor.  Chronic back pain: patient was maintained on home regimen of pain medications.   SIRS:likely secondary to DKA rather than infectious etiology. Resolved.  Thrombocytopenia:Possibly from acute illness. Has had intermittent thrombocytopenia in the past upon chart review. Presented with platelets of 212, this has dropped to 125>93>84. DVT prophylaxis was subsequently discontinued. Seems to have  stabilized and improved to 97. If continues to drop, consider hematology consultation as an outpatient -Repeat CBC within 1  week   Discharge Instructions  Discharge Instructions    Call MD for:  difficulty breathing, headache or visual disturbances    Complete by:  As directed    Call MD for:  extreme fatigue    Complete by:  As directed    Call MD for:  persistant dizziness or light-headedness    Complete by:  As directed    Call MD for:  persistant nausea and vomiting    Complete by:  As directed    Call MD for:  redness, tenderness, or signs of infection (pain, swelling, redness, odor or green/yellow discharge around incision site)    Complete by:  As directed    Call MD for:  severe uncontrolled pain    Complete by:  As directed    Call MD for:  temperature >100.4    Complete by:  As directed    Diet - low sodium heart healthy    Complete by:  As directed    Diet Carb Modified    Complete by:  As directed    Discharge instructions    Complete by:  As directed    Follow up with PCP within 1 week. Take all medications as prescribed and have Insulin Adjusted as an outpatient. Repeat Blood work within 1 week. If symptoms change or worsen please return to the ED for evaluation.   Increase activity slowly    Complete by:  As directed      Allergies as of 12/09/2016      Reactions   Morphine Itching   Penicillins Itching   Has patient had a PCN reaction causing immediate rash, facial/tongue/throat swelling, SOB or lightheadedness with hypotension: No Has patient had a PCN reaction causing severe rash involving mucus membranes or skin necrosis: No Has patient had a PCN reaction that required hospitalization: No Has patient had a PCN reaction occurring within the last 10 years: Yes If all of the above answers are "NO", then may proceed with Cephalosporin use.   Amoxicillin Rash   Meperidine Itching      Medication List    TAKE these medications   ARIPiprazole 5 MG  tablet Commonly known as:  ABILIFY Take 5 mg by mouth daily.   BAYER CONTOUR TEST test strip Generic drug:  glucose blood Apply 1 strip topically 3 (three) times daily as needed.   canagliflozin 100 MG Tabs tablet Commonly known as:  INVOKANA Take 100 mg by mouth daily.   carvedilol 12.5 MG tablet Commonly known as:  COREG Take 12.5 mg by mouth 2 (two) times daily.   DULoxetine 30 MG capsule Commonly known as:  CYMBALTA Take 30 mg by mouth 2 (two) times daily.   escitalopram 10 MG tablet Commonly known as:  LEXAPRO Take 10 mg by mouth 2 (two) times daily.   fenofibrate 160 MG tablet Take 160 mg by mouth.   gabapentin 300 MG capsule Commonly known as:  NEURONTIN Take 600 mg by mouth 3 (three) times daily.   glipiZIDE 10 MG tablet Commonly known as:  GLUCOTROL Take 10 mg by mouth.   insulin lispro 100 UNIT/ML injection Commonly known as:  HUMALOG Inject 0-10 Units into the skin 3 (three) times daily before meals. Per sliding scale   LANTUS SOLOSTAR 100 UNIT/ML Solostar Pen Generic drug:  Insulin Glargine Inject 30 units subcutaneously twice daily   methocarbamol 750 MG tablet Commonly known as:  ROBAXIN Take 750 mg by mouth 4 (four) times daily as needed for muscle spasms (back).  nabumetone 500 MG tablet Commonly known as:  RELAFEN Take 500 mg by mouth 2 (two) times daily.   nicotine 14 mg/24hr patch Commonly known as:  NICODERM CQ - dosed in mg/24 hours Place 1 patch (14 mg total) onto the skin daily.   omeprazole 20 MG capsule Commonly known as:  PRILOSEC Take 20 mg by mouth daily.   traZODone 50 MG tablet Commonly known as:  DESYREL Take 100 mg by mouth at bedtime.            Discharge Care Instructions        Start     Ordered   12/10/16 0000  nicotine (NICODERM CQ - DOSED IN MG/24 HOURS) 14 mg/24hr patch  Daily     12/09/16 1228   12/09/16 0000  Increase activity slowly     12/09/16 1228   12/09/16 0000  Diet - low sodium heart healthy      12/09/16 1228   12/09/16 0000  Discharge instructions    Comments:  Follow up with PCP within 1 week. Take all medications as prescribed and have Insulin Adjusted as an outpatient. Repeat Blood work within 1 week. If symptoms change or worsen please return to the ED for evaluation.   12/09/16 1228   12/09/16 0000  Call MD for:  persistant nausea and vomiting     12/09/16 1228   12/09/16 0000  Call MD for:  temperature >100.4     12/09/16 1228   12/09/16 0000  Call MD for:  severe uncontrolled pain     12/09/16 1228   12/09/16 0000  Call MD for:  redness, tenderness, or signs of infection (pain, swelling, redness, odor or green/yellow discharge around incision site)     12/09/16 1228   12/09/16 0000  Call MD for:  difficulty breathing, headache or visual disturbances     12/09/16 1228   12/09/16 0000  Call MD for:  persistant dizziness or light-headedness     12/09/16 1228   12/09/16 0000  Call MD for:  extreme fatigue     12/09/16 1228   12/09/16 0000  Diet Carb Modified     12/09/16 1228      Allergies  Allergen Reactions  . Morphine Itching  . Penicillins Itching    Has patient had a PCN reaction causing immediate rash, facial/tongue/throat swelling, SOB or lightheadedness with hypotension: No Has patient had a PCN reaction causing severe rash involving mucus membranes or skin necrosis: No Has patient had a PCN reaction that required hospitalization: No Has patient had a PCN reaction occurring within the last 10 years: Yes If all of the above answers are "NO", then may proceed with Cephalosporin use.  Marland Kitchen Amoxicillin Rash  . Meperidine Itching    Consultations:  None  Procedures/Studies:  No results found.   Subjective: Seen and examined and stated he felt "1000x better." No nausea or vomiting. No CP or SOB. Ready to go home.   Discharge Exam: Vitals:   12/09/16 0517 12/09/16 0900  BP: 90/67 122/85  Pulse: 91 73  Resp: 18 18  Temp: 98.4 F (36.9 C) 98.6 F  (37 C)  SpO2: 98% 98%   Vitals:   12/08/16 1717 12/08/16 2154 12/09/16 0517 12/09/16 0900  BP: 112/77 118/75 90/67 122/85  Pulse: 68 85 91 73  Resp: Temp: 98.7 F (37.1 C) 98.1 F (36.7 C) 98.4 F (36.9 C) 98.6 F (37 C)  TempSrc: Oral Oral Oral Oral  SpO2: 98% 100% 98% 98%  Weight:  64.3 kg (141 lb 12.1 oz)    Height:       General: Pt is alert, awake, not in acute distress Cardiovascular: RRR, S1/S2 +, no rubs, no gallops Respiratory: CTA bilaterally, no wheezing, no rhonchi Abdominal: Soft, NT, ND, bowel sounds + Extremities: no edema, no cyanosis  The results of significant diagnostics from this hospitalization (including imaging, microbiology, ancillary and laboratory) are listed below for reference.    Microbiology: Recent Results (from the past 240 hour(s))  Culture, blood (Routine X 2) w Reflex to ID Panel     Status: None (Preliminary result)   Collection Time: 12/05/16  2:10 AM  Result Value Ref Range Status   Specimen Description BLOOD LEFT ANTECUBITAL  Final   Special Requests   Final    BOTTLES DRAWN AEROBIC AND ANAEROBIC Blood Culture adequate volume   Culture NO GROWTH 3 DAYS  Final   Report Status PENDING  Incomplete  Culture, blood (Routine X 2) w Reflex to ID Panel     Status: None (Preliminary result)   Collection Time: 12/05/16  2:24 AM  Result Value Ref Range Status   Specimen Description BLOOD RIGHT HAND  Final   Special Requests   Final    BOTTLES DRAWN AEROBIC ONLY Blood Culture adequate volume   Culture NO GROWTH 3 DAYS  Final   Report Status PENDING  Incomplete  Urine Culture     Status: Abnormal   Collection Time: 12/05/16  3:45 AM  Result Value Ref Range Status   Specimen Description URINE, RANDOM  Final   Special Requests NONE  Final   Culture <10,000 COLONIES/mL INSIGNIFICANT GROWTH (A)  Final   Report Status 12/06/2016 FINAL  Final  MRSA PCR Screening     Status: None   Collection Time: 12/05/16  5:22 PM  Result Value  Ref Range Status   MRSA by PCR NEGATIVE NEGATIVE Final    Comment:        The GeneXpert MRSA Assay (FDA approved for NASAL specimens only), is one component of a comprehensive MRSA colonization surveillance program. It is not intended to diagnose MRSA infection nor to guide or monitor treatment for MRSA infections.     Labs: BNP (last 3 results) No results for input(s): BNP in the last 8760 hours. Basic Metabolic Panel:  Recent Labs Lab 12/05/16 0208  12/05/16 1418 12/06/16 0300 12/07/16 0341 12/08/16 0524 12/09/16 0450  NA 133*  < > 134* 133* 136 138 138  K 3.5  < > 2.8* 3.2* 3.3* 3.5 3.2*  CL 94*  < > 105 103 106 103 102  CO2 11*  < > 20* 20* GLUCOSE 397*  < > 148* 300* 252* 137* 119*  BUN 23*  < > 11 6 <5* <5* <5*  CREATININE 1.79*  < > 0.83 0.87 0.52* 0.67 0.58*  CALCIUM 8.4*  < > 8.1* 8.2* 8.5* 8.7* 8.6*  MG 1.6*  --   --  1.6* 1.5* 1.8 1.6*  < > = values in this interval not displayed. Liver Function Tests:  Recent Labs Lab 12/04/16 2156  AST 34  ALT 51  ALKPHOS 114  BILITOT 2.6*  PROT 7.7  ALBUMIN 4.3    Recent Labs Lab 12/04/16 2156  LIPASE 18   No results for input(s): AMMONIA in the last 168 hours. CBC:  Recent Labs Lab 12/05/16 0943 12/06/16 0300 12/07/16 0341 12/08/16 0524 12/09/16 0450  WBC 8.8 6.9  4.2 4.6 5.3  NEUTROABS  --   --   --   --  3.5  HGB 12.9* 12.9* 12.2* 13.0 12.4*  HCT 34.9* 35.8* 34.0* 37.8* 36.5*  MCV 81.4 81.9 83.5 86.3 86.3  PLT 125* 93* 84* 90* 97*   Cardiac Enzymes: No results for input(s): CKTOTAL, CKMB, CKMBINDEX, TROPONINI in the last 168 hours. BNP: Invalid input(s): POCBNP CBG:  Recent Labs Lab 12/08/16 1219 12/08/16 1652 12/08/16 2152 12/09/16 0746 12/09/16 1145  GLUCAP 90 136* 203* 160* 181*   D-Dimer No results for input(s): DDIMER in the last 72 hours. Hgb A1c No results for input(s): HGBA1C in the last 72 hours. Lipid Profile No results for input(s): CHOL, HDL, LDLCALC,  TRIG, CHOLHDL, LDLDIRECT in the last 72 hours. Thyroid function studies No results for input(s): TSH, T4TOTAL, T3FREE, THYROIDAB in the last 72 hours.  Invalid input(s): FREET3 Anemia work up No results for input(s): VITAMINB12, FOLATE, FERRITIN, TIBC, IRON, RETICCTPCT in the last 72 hours. Urinalysis    Component Value Date/Time   COLORURINE YELLOW 12/05/2016 0940   APPEARANCEUR CLEAR 12/05/2016 0940   LABSPEC 1.014 12/05/2016 0940   PHURINE 5.0 12/05/2016 0940   GLUCOSEU >=500 (A) 12/05/2016 0940   HGBUR SMALL (A) 12/05/2016 0940   BILIRUBINUR NEGATIVE 12/05/2016 0940   KETONESUR 80 (A) 12/05/2016 0940   PROTEINUR 30 (A) 12/05/2016 0940   UROBILINOGEN 0.2 05/31/2009 1242   NITRITE NEGATIVE 12/05/2016 0940   LEUKOCYTESUR NEGATIVE 12/05/2016 0940   Sepsis Labs Invalid input(s): PROCALCITONIN,  WBC,  LACTICIDVEN Microbiology Recent Results (from the past 240 hour(s))  Culture, blood (Routine X 2) w Reflex to ID Panel     Status: None (Preliminary result)   Collection Time: 12/05/16  2:10 AM  Result Value Ref Range Status   Specimen Description BLOOD LEFT ANTECUBITAL  Final   Special Requests   Final    BOTTLES DRAWN AEROBIC AND ANAEROBIC Blood Culture adequate volume   Culture NO GROWTH 3 DAYS  Final   Report Status PENDING  Incomplete  Culture, blood (Routine X 2) w Reflex to ID Panel     Status: None (Preliminary result)   Collection Time: 12/05/16  2:24 AM  Result Value Ref Range Status   Specimen Description BLOOD RIGHT HAND  Final   Special Requests   Final    BOTTLES DRAWN AEROBIC ONLY Blood Culture adequate volume   Culture NO GROWTH 3 DAYS  Final   Report Status PENDING  Incomplete  Urine Culture     Status: Abnormal   Collection Time: 12/05/16  3:45 AM  Result Value Ref Range Status   Specimen Description URINE, RANDOM  Final   Special Requests NONE  Final   Culture <10,000 COLONIES/mL INSIGNIFICANT GROWTH (A)  Final   Report Status 12/06/2016 FINAL  Final   MRSA PCR Screening     Status: None   Collection Time: 12/05/16  5:22 PM  Result Value Ref Range Status   MRSA by PCR NEGATIVE NEGATIVE Final    Comment:        The GeneXpert MRSA Assay (FDA approved for NASAL specimens only), is one component of a comprehensive MRSA colonization surveillance program. It is not intended to diagnose MRSA infection nor to guide or monitor treatment for MRSA infections.    Time coordinating discharge: 35 minutes  SIGNED:  Merlene Laughtermair Latif Sheikh, DO [facility] 12/09/2016, 12:29 PM Pager [phone]  If 7PM-7AM, please contact night-coverage [email or web address] Password [email or web address]

## 2016-12-09 NOTE — Progress Notes (Signed)
Terry Schultz to be D/C'd Home per MD order.  Discussed prescriptions and follow up appointments with the patient. Prescriptions given to patient, medication list explained in detail. Pt verbalized understanding.  Allergies as of 12/09/2016      Reactions   Morphine Itching   Penicillins Itching   Has patient had a PCN reaction causing immediate rash, facial/tongue/throat swelling, SOB or lightheadedness with hypotension: No Has patient had a PCN reaction causing severe rash involving mucus membranes or skin necrosis: No Has patient had a PCN reaction that required hospitalization: No Has patient had a PCN reaction occurring within the last 10 years: Yes If all of the above answers are "NO", then may proceed with Cephalosporin use.   Amoxicillin Rash   Meperidine Itching      Medication List    TAKE these medications   ARIPiprazole 5 MG tablet Commonly known as:  ABILIFY Take 5 mg by mouth daily.   BAYER CONTOUR TEST test strip Generic drug:  glucose blood Apply 1 strip topically 3 (three) times daily as needed.   canagliflozin 100 MG Tabs tablet Commonly known as:  INVOKANA Take 100 mg by mouth daily.   carvedilol 12.5 MG tablet Commonly known as:  COREG Take 12.5 mg by mouth 2 (two) times daily.   DULoxetine 30 MG capsule Commonly known as:  CYMBALTA Take 30 mg by mouth 2 (two) times daily.   escitalopram 10 MG tablet Commonly known as:  LEXAPRO Take 10 mg by mouth 2 (two) times daily.   fenofibrate 160 MG tablet Take 160 mg by mouth.   gabapentin 300 MG capsule Commonly known as:  NEURONTIN Take 600 mg by mouth 3 (three) times daily.   glipiZIDE 10 MG tablet Commonly known as:  GLUCOTROL Take 10 mg by mouth.   insulin lispro 100 UNIT/ML injection Commonly known as:  HUMALOG Inject 0-10 Units into the skin 3 (three) times daily before meals. Per sliding scale   LANTUS SOLOSTAR 100 UNIT/ML Solostar Pen Generic drug:  Insulin Glargine Inject 30 units  subcutaneously twice daily   methocarbamol 750 MG tablet Commonly known as:  ROBAXIN Take 750 mg by mouth 4 (four) times daily as needed for muscle spasms (back).   nabumetone 500 MG tablet Commonly known as:  RELAFEN Take 500 mg by mouth 2 (two) times daily.   nicotine 14 mg/24hr patch Commonly known as:  NICODERM CQ - dosed in mg/24 hours Place 1 patch (14 mg total) onto the skin daily.   omeprazole 20 MG capsule Commonly known as:  PRILOSEC Take 20 mg by mouth daily.   traZODone 50 MG tablet Commonly known as:  DESYREL Take 100 mg by mouth at bedtime.            Discharge Care Instructions        Start     Ordered   12/10/16 0000  nicotine (NICODERM CQ - DOSED IN MG/24 HOURS) 14 mg/24hr patch  Daily     12/09/16 1228   12/09/16 0000  Increase activity slowly     12/09/16 1228   12/09/16 0000  Diet - low sodium heart healthy     12/09/16 1228   12/09/16 0000  Discharge instructions    Comments:  Follow up with PCP within 1 week. Take all medications as prescribed and have Insulin Adjusted as an outpatient. Repeat Blood work within 1 week. If symptoms change or worsen please return to the ED for evaluation.   12/09/16 1228  12/09/16 0000  Call MD for:  persistant nausea and vomiting     12/09/16 1228   12/09/16 0000  Call MD for:  temperature >100.4     12/09/16 1228   12/09/16 0000  Call MD for:  severe uncontrolled pain     12/09/16 1228   12/09/16 0000  Call MD for:  redness, tenderness, or signs of infection (pain, swelling, redness, odor or green/yellow discharge around incision site)     12/09/16 1228   12/09/16 0000  Call MD for:  difficulty breathing, headache or visual disturbances     12/09/16 1228   12/09/16 0000  Call MD for:  persistant dizziness or light-headedness     12/09/16 1228   12/09/16 0000  Call MD for:  extreme fatigue     12/09/16 1228   12/09/16 0000  Diet Carb Modified     12/09/16 1228      Vitals:   12/09/16 0517 12/09/16  0900  BP: 90/67 122/85  Pulse: 91 73  Resp: 18 18  Temp: 98.4 F (36.9 C) 98.6 F (37 C)  SpO2: 98% 98%    Skin clean, dry and intact without evidence of skin break down, no evidence of skin tears noted. IV catheter discontinued intact. Site without signs and symptoms of complications. Dressing and pressure applied. Pt denies pain at this time. No complaints noted.  An After Visit Summary was printed and given to the patient. Patient escorted via WC, and D/C home via private auto.  Nelma RothmanNatalie Zamani Crocker, RN [facility] Phone [phone]

## 2016-12-10 LAB — CULTURE, BLOOD (ROUTINE X 2)
Culture: NO GROWTH
Culture: NO GROWTH
Special Requests: ADEQUATE
Special Requests: ADEQUATE

## 2016-12-18 ENCOUNTER — Ambulatory Visit: Payer: Self-pay | Admitting: *Deleted

## 2016-12-18 DIAGNOSIS — E1165 Type 2 diabetes mellitus with hyperglycemia: Secondary | ICD-10-CM

## 2016-12-18 DIAGNOSIS — E118 Type 2 diabetes mellitus with unspecified complications: Principal | ICD-10-CM

## 2016-12-18 LAB — GLUCOSE, POCT (MANUAL RESULT ENTRY): POC GLUCOSE: 556 mg/dL — AB (ref 70–99)

## 2016-12-18 NOTE — Progress Notes (Signed)
Pt presents to clinic stating "I think my diabetes is acting up." When asked to clarify he states he has been lightheaded since awakening this am and "just can't function." CBG checked 556. Reports he took his long acting insulin this morning but has not taken his short acting. Ate protein bar for breakfast, has not checked his sugar this am before coming to clinic. Asked why he did not take his humalog before breakfast he sts he does not always need it. Encouraged him to be checking his blood sugar at least before each meal and administering insulin according to his sliding scale instead of trying to catch up with lowering his sugars later. Instructed him to take his Humalog now, push fluids, and come back to clinic in 30 minutes to recheck cbg. Pt unhappy with this and sts "I'll probably just call an ambulance, I can't walk, I can't think, I can't do anything." Again, encouraged pt to take Humalog and recheck with RN, but pt did not respond and walked out of RN office. Able gait, slightly unsteady as he has r/t neuropathy previously per his baseline as RN has witnessed over past 6 months.  Will attempt to f/u with pt later in day if he stays at work.

## 2016-12-21 NOTE — Progress Notes (Signed)
Spoke with pt via personal phone. Sts he is at work. Was unable to reach him via work line. Reports his sugar has "come down some." Does not give last reading. Has not made post-d/c f/u appt with pcp. Reports he has appt [facility] appt scheduled with RN Friday this week. RN will send results to pcp, but informed pt he still needs to schedule an OV appt with pcp. Pt verbalized understanding and agreement. RN will draw magnesium level in addition to [facility] labs per d/c notes. Pt hurried to complete phone call due to being at work. Will question medication regimen again at scheduled appointment. Advised to push fluids and avoid dehydration at last visit. Pt denies further questions or concerns at this time.

## 2016-12-22 ENCOUNTER — Ambulatory Visit: Payer: No Typology Code available for payment source | Admitting: *Deleted

## 2016-12-22 DIAGNOSIS — Z794 Long term (current) use of insulin: Principal | ICD-10-CM

## 2016-12-22 DIAGNOSIS — E118 Type 2 diabetes mellitus with unspecified complications: Secondary | ICD-10-CM

## 2016-12-22 LAB — GLUCOSE, POCT (MANUAL RESULT ENTRY): POC Glucose: 553 mg/dl — AB (ref 70–99)

## 2016-12-22 NOTE — Progress Notes (Signed)
Pt no-show for 1430 appt with RN. Due to scheduled appts, RN unable to go to pt's workstation in afternoon. Will plan to f/u with pt on 9/27 upon RN return to clinic.

## 2016-12-22 NOTE — Progress Notes (Signed)
11:20am: Pt seen in clinic for repeat CBG. Remains high at 553. Has not taken his Humalog or Lantus injections. Reports his stomach is getting sore, so he has not been taking his injections. Offered alternate site injections. Prefers to have RN inject posterior upper arm. 11 units Humalog administered per pt's reported sliding scale with pt verifying dosage in syringe.  Advised pt to return to clinic approx 2:30 this afternoon before he goes to lunch for repeat CBG and additional Humalog injection. Also advised him to take his morning Lantus dose which he has not taken.  Also plan to have pt RTC Thursday for repeat cbg and humalog dosing if needed. Depending on results of cbg, pt may be advised not to fast for scheduled labs Friday 2/2 recent AKI and dehydration during hospitalization earlier this month.

## 2016-12-25 ENCOUNTER — Ambulatory Visit: Payer: No Typology Code available for payment source | Admitting: *Deleted

## 2016-12-25 VITALS — BP 109/76 | HR 101 | Ht 67.0 in | Wt 118.0 lb

## 2016-12-25 DIAGNOSIS — E118 Type 2 diabetes mellitus with unspecified complications: Secondary | ICD-10-CM

## 2016-12-25 DIAGNOSIS — Z794 Long term (current) use of insulin: Secondary | ICD-10-CM

## 2016-12-25 DIAGNOSIS — E1110 Type 2 diabetes mellitus with ketoacidosis without coma: Secondary | ICD-10-CM

## 2016-12-25 DIAGNOSIS — Z0000 Encounter for general adult medical examination without abnormal findings: Secondary | ICD-10-CM

## 2016-12-25 NOTE — Progress Notes (Signed)
[facility] insurance premium discount evaluation: Labs Drawn. Repl[employer] ROI form signed. Tobacco Free Attestation form signed.  Forms placed in paper chart.  Mag drawn per d/c summary in addition to labs for [facility].  Okay to route results to pcp as part of post hospital d/c follow up per pt.

## 2016-12-28 LAB — CMP12+LP+TP+TSH+6AC+PSA+CBC…
ALK PHOS: 116 IU/L (ref 39–117)
ALT: 63 IU/L — AB (ref 0–44)
AST: 87 IU/L — ABNORMAL HIGH (ref 0–40)
Albumin/Globulin Ratio: 1.8 (ref 1.2–2.2)
Albumin: 4.4 g/dL (ref 3.5–5.5)
BASOS: 1 %
BUN / CREAT RATIO: 10 (ref 9–20)
BUN: 7 mg/dL (ref 6–24)
Basophils Absolute: 0 10*3/uL (ref 0.0–0.2)
Bilirubin Total: 0.5 mg/dL (ref 0.0–1.2)
CALCIUM: 9.1 mg/dL (ref 8.7–10.2)
CHOL/HDL RATIO: 3.5 ratio (ref 0.0–5.0)
CREATININE: 0.69 mg/dL — AB (ref 0.76–1.27)
Chloride: 80 mmol/L — ABNORMAL LOW (ref 96–106)
Cholesterol, Total: 187 mg/dL (ref 100–199)
EOS (ABSOLUTE): 0 10*3/uL (ref 0.0–0.4)
Eos: 1 %
Estimated CHD Risk: 0.5 times avg. (ref 0.0–1.0)
Free Thyroxine Index: 1.8 (ref 1.2–4.9)
GFR, EST AFRICAN AMERICAN: 130 mL/min/{1.73_m2} (ref >59)
GFR, EST NON AFRICAN AMERICAN: 112 mL/min/{1.73_m2} (ref >59)
GGT: 257 IU/L — ABNORMAL HIGH (ref 0–65)
GLOBULIN, TOTAL: 2.4 g/dL (ref 1.5–4.5)
GLUCOSE: 710 mg/dL — AB (ref 65–99)
HDL: 54 mg/dL (ref >39)
HEMATOCRIT: 45.7 % (ref 37.5–51.0)
HEMOGLOBIN: 15.2 g/dL (ref 13.0–17.7)
Immature Grans (Abs): 0 10*3/uL (ref 0.0–0.1)
Immature Granulocytes: 0 %
Iron: 123 ug/dL (ref 38–169)
LDH: 214 IU/L (ref 121–224)
Lymphocytes Absolute: 1.3 10*3/uL (ref 0.7–3.1)
Lymphs: 30 %
MCH: 30.3 pg (ref 26.6–33.0)
MCHC: 33.3 g/dL (ref 31.5–35.7)
MCV: 91 fL (ref 79–97)
MONOCYTES: 7 %
Monocytes Absolute: 0.3 10*3/uL (ref 0.1–0.9)
NEUTROS ABS: 2.7 10*3/uL (ref 1.4–7.0)
Neutrophils: 61 %
POTASSIUM: 3.4 mmol/L — AB (ref 3.5–5.2)
Phosphorus: 3.7 mg/dL (ref 2.5–4.5)
Platelets: 135 10*3/uL — ABNORMAL LOW (ref 150–379)
Prostate Specific Ag, Serum: 0.9 ng/mL (ref 0.0–4.0)
RBC: 5.02 x10E6/uL (ref 4.14–5.80)
RDW: 13.2 % (ref 12.3–15.4)
SODIUM: 135 mmol/L (ref 134–144)
T3 Uptake Ratio: 26 % (ref 24–39)
T4, Total: 7 ug/dL (ref 4.5–12.0)
TRIGLYCERIDES: 566 mg/dL — AB (ref 0–149)
TSH: 4.4 u[IU]/mL (ref 0.450–4.500)
Total Protein: 6.8 g/dL (ref 6.0–8.5)
URIC ACID: 4.3 mg/dL (ref 3.7–8.6)
WBC: 4.4 10*3/uL (ref 3.4–10.8)

## 2016-12-28 LAB — MAGNESIUM: Magnesium: 1.6 mg/dL (ref 1.6–2.3)

## 2016-12-28 LAB — GLUCOSE, POCT (MANUAL RESULT ENTRY): POC GLUCOSE: 600 mg/dL — AB (ref 70–99)

## 2016-12-28 LAB — HGB A1C W/O EAG: HEMOGLOBIN A1C: 12.6 % — AB (ref 4.8–5.6)

## 2016-12-28 NOTE — Progress Notes (Addendum)
Glucose and trigs critical levels reported to Albina Billet NP at 12/28/16 at 0930 just after receiving report from [facility].   Creatinine low but improved from previous at hospital d/c. Potassium and chloride low. AST, ALT, GGT elevated. Platelets chronically low but improved from previous. A1c pending. [facility] reports A1c's are only ran M-F and it must not have made it in to Friday's run despite other A1c's in same batch already resulting. Should be resulted tomorrow morning at the latest.   Pt reports drinking lots of milk over the weekend, one Coke. Has not had breakfast today. Macaroni and cheese for dinner last night. [place] sandwich for lunch yesterday. No breakfast yesterday. Denies etoh intake.  CBG "HI" on meter in clinic today. Pt reports he last took his Lantus last night. Has not taken anything today. When questioned about his Humalog, he reports he only takes Lantus BID, and is not aware of the Humalog dosing with meals, despite bringing RN Humalog vial last week for alternative site injection in back of arm due to abdomen soreness and stating his sliding scale prescription of 2 units per 50 points of CBG. Pt also able to bring Humalog vial to clinic today at RN request to administer alternative site injection again. Pt sts he has not been taking Humalog being unaware of instructions to do so. 10units per pt's documented max sliding scale on med list administered posterior L upper arm.   Reinforced with pt that lab levels in this range can lead to repeat hospitalization and organ damage. Offered to make appt for him with Dr. Alwyn Ren. He reports he must check if he has any leave time left because he will get fired if he does not work. Suggested he speak to his supervisor regarding the situation and explain the level of severity of need for this follow up and they could work with him to attend the appt. Pt became frustrated and again stated he is on his last leg at work and can't take off and  will speak to his supervisor and to not make an appt for him until he does that. Pt agrees to NP appt tomorrow in clinic. Appt made. Reminded pt he could come back to clinic this afternoon for repeat CBG check and Humalog dosing before his late lunch. He verbalized understanding, but did not verbalize agreement to do so.

## 2016-12-28 NOTE — Addendum Note (Signed)
Addended by: Loraine Grip on: 12/28/2016 11:33 AM   Modules accepted: Orders, SmartSet

## 2016-12-28 NOTE — Progress Notes (Signed)
Original note updated to include 10units Humalog administered per sliding scale orders by RN as pt requested alternative site dosing. Also updated to include that pt denies etoh intake. Primary fluid source he reports as being milk, no water. Reinforced need for hydration with water, not other sugary fluids.

## 2016-12-29 ENCOUNTER — Ambulatory Visit: Payer: No Typology Code available for payment source | Admitting: *Deleted

## 2016-12-29 ENCOUNTER — Ambulatory Visit: Payer: No Typology Code available for payment source | Admitting: Registered Nurse

## 2016-12-29 VITALS — BP 124/83 | HR 95 | Temp 98.4°F

## 2016-12-29 DIAGNOSIS — R197 Diarrhea, unspecified: Secondary | ICD-10-CM

## 2016-12-29 DIAGNOSIS — E1165 Type 2 diabetes mellitus with hyperglycemia: Principal | ICD-10-CM

## 2016-12-29 DIAGNOSIS — Z794 Long term (current) use of insulin: Principal | ICD-10-CM

## 2016-12-29 DIAGNOSIS — E1140 Type 2 diabetes mellitus with diabetic neuropathy, unspecified: Secondary | ICD-10-CM

## 2016-12-29 DIAGNOSIS — IMO0002 Reserved for concepts with insufficient information to code with codable children: Secondary | ICD-10-CM

## 2016-12-29 LAB — GLUCOSE, POCT (MANUAL RESULT ENTRY)
POC GLUCOSE: 600 mg/dL — AB (ref 70–99)
POC Glucose: 600 mg/dl — AB (ref 70–99)

## 2016-12-29 MED ORDER — "INSULIN SYRINGE-NEEDLE U-100 30G X 1/2"" 0.3 ML MISC": 1.0000 | Freq: Three times a day (TID) | 1 refills | Status: DC | PRN

## 2016-12-29 NOTE — Progress Notes (Signed)
Subjective:    Patient ID: Terry Schultz, male    DOB: 06-04-1970, 46 y.o.   MRN: [id number]  46y/o divorced caucasian male established Pt here for f/u with NP since Terry Schultz has not seen his pcp since hospital discharge on 12/09/2016 for diabetic ketoacidosis for follow up labs.  Patient has been intermittently following up with [facility] RN Cheri Guppy due to pain with insulin injections into stomach.  Had discussed rotation of injection sites with patient last week at workstation and emphasized RN available if Terry Schultz required assistance with booking PCM appt/injecting medications/checking blood sugars. Nonfasting labs drawn Friday 28 Sep and patient was informed of critical results 28 Dec 2016 by RN Hildred Alamin and instructed again to call [facility] for appt same day. Patient has not checked cbg at home since being seen yesterday by RN. Has not administered any additional Humalog at home, reporting Terry Schultz only had one needle left and [facility]. States Terry Schultz did take his Lantus this morning. For breakfast today, has had sausage mcmuffin, regular ginger ale and a specialty coffee. CBG reads "HI" on meter again this am, >600 in clinic. 10 units Humalog administered by RN from patient medications to alternative site, R upper posterior arm per pt's sliding scale dosing.  Patient storing insulin in cooler with ice pack at workstation. Patient reports Terry Schultz has been having diarrhea light brown tan soft formed, seems worse at night.  Having accidents and having to shower in middle of night.  Patient had left worksite and/or not returned to see clinic RN in the afternoons M, Tu, Th and Fridays for assistance checking his blood sugars and/or administering his medications since 21 Sep when Terry Schultz notified RN of hospitalization.  Patient reported Terry Schultz cannot take any time off from work to see PCM.  PCM located in [place], [place] and too far away from his new apt in [place], [place] and worksite ([place], [place]) to not  miss time away from work.  Patient reported not feeling mentally sharp, intermittent blurry vision with glasses, bilateral leg tingling/pain/feet swelling at end of workday and diarrhea daily.  Patient reported Terry Schultz has met with nutrition/dietary specialist during his hospitalizations this summer and is knowledgeable on diabetic food choices/carb counting.  Patient has anxiety at work as Terry Schultz is concerned Terry Schultz may lose his job due to his missing work.  Patient reported Terry Schultz has tried to drink more water this week than soda and has water at his workstation this week.  HR rep reported patient left work unexpectedly 21 Sep for doctor's appt.  No documentation noted in Epic/Care Everywhere of any patient visits with PCM/UC/ER/specialists.  Patient denied vomiting, watery diarrhea, fever, chills, blood red or black in stool, LOC, seizures, passing out, chest pain, foot ulcers.  Patient reported Terry Schultz is urinating regularly pale clear yellow tinged urine without difficulty.      Review of Systems  Constitutional: Negative for activity change, appetite change, chills, diaphoresis, fatigue, fever and unexpected weight change.  HENT: Negative for trouble swallowing and voice change.   Eyes: Positive for visual disturbance. Negative for photophobia, pain, discharge, redness and itching.  Respiratory: Negative for cough, choking, chest tightness, shortness of breath, wheezing and stridor.   Cardiovascular: Positive for leg swelling. Negative for chest pain and palpitations.  Gastrointestinal: Positive for abdominal pain and diarrhea. Negative for anal bleeding, blood in stool, constipation, nausea and vomiting.  Endocrine: Negative for cold intolerance and heat intolerance.  Genitourinary: Negative for  difficulty urinating and hematuria.  Musculoskeletal: Positive for myalgias. Negative for back pain, neck pain and neck stiffness.  Skin: Negative for color change, pallor, rash and wound.  Allergic/Immunologic: Positive  for environmental allergies and immunocompromised state. Negative for food allergies.  Neurological: Positive for numbness and headaches. Negative for dizziness, seizures, syncope, facial asymmetry, speech difficulty and light-headedness.  Hematological: Negative for adenopathy. Does not bruise/bleed easily.  Psychiatric/Behavioral: Positive for decreased concentration and sleep disturbance. Negative for agitation and hallucinations. The patient is nervous/anxious. The patient is not hyperactive.        Objective:   Physical Exam  Constitutional: Terry Schultz is oriented to person, place, and time. Vital signs are normal. Terry Schultz appears well-developed and well-nourished. Terry Schultz is active and cooperative.  Non-toxic appearance. Terry Schultz has a sickly appearance. Terry Schultz does not appear ill. No distress.  HENT:  Head: Normocephalic and atraumatic.  Right Ear: Hearing, external ear and ear canal normal. A middle ear effusion is present.  Left Ear: Hearing, external ear and ear canal normal. A middle ear effusion is present.  Nose: Mucosal edema and rhinorrhea present. No nose lacerations, sinus tenderness, nasal deformity, septal deviation or nasal septal hematoma. No epistaxis.  No foreign bodies. Right sinus exhibits no maxillary sinus tenderness and no frontal sinus tenderness. Left sinus exhibits no maxillary sinus tenderness and no frontal sinus tenderness.  Mouth/Throat: Uvula is midline. Mucous membranes are not pale, dry and not cyanotic. Terry Schultz does not have dentures. No oral lesions. No trismus in the jaw. Normal dentition. No dental abscesses, uvula swelling, lacerations or dental caries. Posterior oropharyngeal edema and posterior oropharyngeal erythema present. No oropharyngeal exudate or tonsillar abscesses.  Bilateral allergic shiners; bilateral TMs air fluid level clear; cobblestoning posterior pharynx;  Saliva tacky slightly white coating tongue  Eyes: Pupils are equal, round, and reactive to light. Conjunctivae, EOM  and lids are normal. Right eye exhibits no chemosis, no discharge, no exudate and no hordeolum. No foreign body present in the right eye. Left eye exhibits no chemosis, no discharge, no exudate and no hordeolum. No foreign body present in the left eye. Right conjunctiva is not injected. Right conjunctiva has no hemorrhage. Left conjunctiva is not injected. Left conjunctiva has no hemorrhage. No scleral icterus. Right eye exhibits normal extraocular motion and no nystagmus. Left eye exhibits normal extraocular motion and no nystagmus. Right pupil is round and reactive. Left pupil is round and reactive. Pupils are equal.  Neck: Trachea normal, normal range of motion and phonation normal. Neck supple. No tracheal tenderness and no muscular tenderness present. No neck rigidity. No tracheal deviation, no edema, no erythema and normal range of motion present. No thyroid mass and no thyromegaly present.  Cardiovascular: Normal rate, regular rhythm, S1 normal, S2 normal, normal heart sounds and intact distal pulses.  PMI is not displaced.  Exam reveals no gallop and no friction rub.   No murmur heard. Pulses:      Radial pulses are 2+ on the right side, and 2+ on the left side.  Pulmonary/Chest: Effort normal and breath sounds normal. No accessory muscle usage or stridor. No respiratory distress. Terry Schultz has no decreased breath sounds. Terry Schultz has no wheezes. Terry Schultz has no rhonchi. Terry Schultz has no rales.  Abdominal: Soft. Normal appearance. Terry Schultz exhibits no shifting dullness, no distension, no pulsatile liver, no fluid wave, no abdominal bruit, no ascites, no pulsatile midline mass and no mass. Bowel sounds are decreased. There is no tenderness. There is no rigidity, no guarding, no CVA tenderness, no  tenderness at McBurney's point and negative Murphy's sign. Hernia confirmed negative in the ventral area.  Dull to percussion x 4 quads; hypoactive bowel sounds x 4 quads  Musculoskeletal: Normal range of motion. Terry Schultz exhibits no edema.        Right shoulder: Normal.       Left shoulder: Normal.       Right elbow: Normal.      Left elbow: Normal.       Right hip: Normal.       Left hip: Normal.       Right knee: Normal.       Left knee: Normal.       Right ankle: Terry Schultz exhibits swelling. Terry Schultz exhibits normal range of motion, no ecchymosis, no deformity, no laceration and normal pulse. Tenderness.       Left ankle: Terry Schultz exhibits swelling. Terry Schultz exhibits normal range of motion, no ecchymosis, no deformity, no laceration and normal pulse. Tenderness. Achilles tendon normal.       Cervical back: Normal.       Thoracic back: Normal.       Lumbar back: Normal.       Right hand: Normal.       Left hand: Normal.  0-1+/4 nonpitting edema bilateral lower extremities/ankle faint sock line when removed; capillary refill less than 2 seconds bilaterally; hyperpigmentation streaky tan noted bilateral lower legs  Lymphadenopathy:       Head (right side): No submental, no submandibular, no tonsillar, no preauricular, no posterior auricular and no occipital adenopathy present.       Head (left side): No submental, no submandibular, no tonsillar, no preauricular, no posterior auricular and no occipital adenopathy present.    Terry Schultz has no cervical adenopathy.       Right cervical: No superficial cervical, no deep cervical and no posterior cervical adenopathy present.      Left cervical: No superficial cervical, no deep cervical and no posterior cervical adenopathy present.  Neurological: Terry Schultz is alert and oriented to person, place, and time. Terry Schultz has normal strength. Terry Schultz is not disoriented. Terry Schultz displays no atrophy and no tremor. No cranial nerve deficit or sensory deficit. Terry Schultz exhibits normal muscle tone. Terry Schultz displays no seizure activity. Gait abnormal. Coordination normal. GCS eye subscore is 4. GCS verbal subscore is 5. GCS motor subscore is 6.  On/off exam table with one hand assist; in/out of chair without difficulty; gait steady and slow mild shuffle noted in  hallway unchanged since 18 Dec 2016  Skin: Skin is warm, dry and intact. Rash noted. No abrasion, no bruising, no burn, no ecchymosis, no laceration, no lesion, no petechiae and no purpura noted. Rash is macular. Rash is not papular, not maculopapular, not nodular, not pustular, not vesicular and not urticarial. Terry Schultz is not diaphoretic. No cyanosis or erythema. No pallor. Nails show no clubbing.     Psychiatric: His speech is normal and behavior is normal. Judgment and thought content normal. His affect is blunt. Cognition and memory are normal.  Patient with flat affect; discussed anxiety over  Job situation and missing time at work to see PCM could impact his employment  Nursing note and vitals reviewed.  Face to face time 30 minutes; accompanied patient ambulatory to his workstation had 4 insulin needles/syringes remaining in his medication bag verified BD 30g 1/2 inch; refilled for patient and sent to his pharmacy of choice electronically and discussed for him to pick up.  Using zippered plastic bed quilt holder to transport his supplies/medications  that do not require refrigeration.  Insulin in insulated lunch bag with cold pack.  Discussed with patient his lab results again, importance of good dietary choices, maintaining hydration to keep urine pale clear yellow.  Administering his medications as prescribed and if assistance required RN Hildred Alamin available [phone] M, Tu, Th and Fr weekly at employee health clinic on worksite.  RN Hildred Alamin to assist patient with finding PCM that is located closer to his apt and assist with making PCM follow up appt post hospitalization.  Reiterated with patient to avoid concentrated/added sugars to diet/drinking regular soda.  Recommended vegetables, lean protein chicken/fish meal choices and water for beverage or diet soda. Discussed with patient HgbA1c now 12.6 (worsening) was 12 in hospital.  Discussed with patient this high level of blood sugar can cause organ damage,  loss of consciousness if it continues and poor food choices/not taking his medications as prescribed and monitoring his blood sugars and administering sliding scale is worsening his condition.  Continuing with his current choices could lead to passing out, ambulance to be called, car accident, and/or rehospitalization.  Discussed diarrhea probably related to high blood sugars and dietary choices as exam WNL, VSS afebrile. Patient verbalized understanding information/instructions and had no further questions at this time. Notified HR Maurice Small that patient having flare of his chronic disease and requiring more frequent monitoring and has been instructed to schedule follow up with his PCM ASAP (appt overdue).  Important that patient has regular meals and keep hydrated e.g. Not skip meals/breaks.  She verbalized understanding and had no further questions at that time.  At 1330 HR rep Maurice Small contacted clinic stating patient told supervisor we recommended Terry Schultz go home for the rest of the day and she wanted to verify we had instructed patient to go home. Discussed with Benjamine Mola we did not recommend his going home and had asked him to return in the afternoon to clinic for recheck and to provide assistance if required with his medications.  Discussed RN Hildred Alamin was also going to assist patient with scheduling PCM follow up appt/finding new PCM this afternoon and that it was important patient see his PCM ASAP.  Discussed patient may have decided to go home if Terry Schultz started having GI upset at work as patient reported problems at home last night and that I did not believe it was a risk to his coworkers but related to his chronic health problems/flare.          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  RN Hildred Alamin and I are here to support you if aversion to giving your own injections/checking blood  sugars and administering sliding scale M, Tu, Th and Fr weekly.  Insulin needs to be taken daily and healthy snack/meal choices in combination to help relieve diarrhea and lower blood sugars.  Water intake to keep hydrated and avoid elevating blood sugars further.  Continue home monitoring of blood sugars TID and administering sliding scale insulin per PCM instructions and bring log to next appt with PCM.  Encouraged to continue exercise routine and wear supportive shoes.  Moisturize skin to keep healthy and prevent sores/breakdown and check feet daily for sores.  Annual routine diabetic eye exam when due annually.    Patient verbalized understanding of information/instructions/ agreed with plan of care and had no further questions at this time.  I have recommended clear fluids and advanced to soft as tolerated  Avoid  added/concentrated sugars, dairy, spicy and fried foods until diarrhea resolves. Patient given exitcare handout on diarrhea and foods to avoid.  Return to the clinic if  symptoms persist or worsen; I have alerted the patient to call if unable to urinate every 8 hours, urine tea/cola colored, high fever, dehydration, marked weakness, fainting, increased abdominal pain, blood in stool or vomit. Patient verbalized agreement and understanding of treatment plan and had no further questions at this time.   P2:  Hand washing and fitness

## 2016-12-30 NOTE — Patient Instructions (Signed)
Food Choices to Help Relieve Diarrhea, Adult When you have diarrhea, the foods you eat and your eating habits are very important. Choosing the right foods and drinks can help:  Relieve diarrhea.  Replace lost fluids and nutrients.  Prevent dehydration.  What general guidelines should I follow? Relieving diarrhea  Choose foods with less than 2 g or .07 oz. of fiber per serving.  Limit fats to less than 8 tsp (38 g or 1.34 oz.) a day.  Avoid the following: ? Foods and beverages sweetened with high-fructose corn syrup, honey, or sugar alcohols such as xylitol, sorbitol, and mannitol. ? Foods that contain a lot of fat or sugar. ? Fried, greasy, or spicy foods. ? High-fiber grains, breads, and cereals. ? Raw fruits and vegetables.  Eat foods that are rich in probiotics. These foods include dairy products such as yogurt and fermented milk products. They help increase healthy bacteria in the stomach and intestines (gastrointestinal tract, or GI tract).  If you have lactose intolerance, avoid dairy products. These may make your diarrhea worse.  Take medicine to help stop diarrhea (antidiarrheal medicine) only as told by your health care provider. Replacing nutrients  Eat small meals or snacks every 3-4 hours.  Eat bland foods, such as white rice, toast, or baked potato, until your diarrhea starts to get better. Gradually reintroduce nutrient-rich foods as tolerated or as told by your health care provider. This includes: ? Well-cooked protein foods. ? Peeled, seeded, and soft-cooked fruits and vegetables. ? Low-fat dairy products.  Take vitamin and mineral supplements as told by your health care provider. Preventing dehydration   Start by sipping water or a special solution to prevent dehydration (oral rehydration solution, ORS). Urine that is clear or pale yellow means that you are getting enough fluid.  Try to drink at least 8-10 cups of fluid each day to help replace lost  fluids.  You may add other liquids in addition to water, such as clear juice or decaffeinated sports drinks, as tolerated or as told by your health care provider.  Avoid drinks with caffeine, such as coffee, tea, or soft drinks.  Avoid alcohol. What foods are recommended? The items listed may not be a complete list. Talk with your health care provider about what dietary choices are best for you. Grains White rice. White, French, or pita breads (fresh or toasted), including plain rolls, buns, or bagels. White pasta. Saltine, soda, or graham crackers. Pretzels. Low-fiber cereal. Cooked cereals made with water (such as cornmeal, farina, or cream cereals). Plain muffins. Matzo. Melba toast. Zwieback. Vegetables Potatoes (without the skin). Most well-cooked and canned vegetables without skins or seeds. Tender lettuce. Fruits Apple sauce. Fruits canned in juice. Cooked apricots, cherries, grapefruit, peaches, pears, or plums. Fresh bananas and cantaloupe. Meats and other protein foods Baked or boiled chicken. Eggs. Tofu. Fish. Seafood. Smooth nut butters. Ground or well-cooked tender beef, ham, veal, lamb, pork, or poultry. Dairy Plain yogurt, kefir, and unsweetened liquid yogurt. Lactose-free milk, buttermilk, skim milk, or soy milk. Low-fat or nonfat hard cheese. Beverages Water. Low-calorie sports drinks. Fruit juices without pulp. Strained tomato and vegetable juices. Decaffeinated teas. Sugar-free beverages not sweetened with sugar alcohols. Oral rehydration solutions, if approved by your health care provider. Seasoning and other foods Bouillon, broth, or soups made from recommended foods. What foods are not recommended? The items listed may not be a complete list. Talk with your health care provider about what dietary choices are best for you. Grains Whole grain, whole wheat,   bran, or rye breads, rolls, pastas, and crackers. Wild or brown rice. Whole grain or bran cereals. Barley. Oats and  oatmeal. Corn tortillas or taco shells. Granola. Popcorn. Vegetables Raw vegetables. Fried vegetables. Cabbage, broccoli, Brussels sprouts, artichokes, baked beans, beet greens, corn, kale, legumes, peas, sweet potatoes, and yams. Potato skins. Cooked spinach and cabbage. Fruits Dried fruit, including raisins and dates. Raw fruits. Stewed or dried prunes. Canned fruits with syrup. Meat and other protein foods Fried or fatty meats. Deli meats. Chunky nut butters. Nuts and seeds. Beans and lentils. Terry Schultz. Hot dogs. Sausage. Dairy High-fat cheeses. Whole milk, chocolate milk, and beverages made with milk, such as milk shakes. Half-and-half. Cream. sour cream. Ice cream. Beverages Caffeinated beverages (such as coffee, tea, soda, or energy drinks). Alcoholic beverages. Fruit juices with pulp. Prune juice. Soft drinks sweetened with high-fructose corn syrup or sugar alcohols. High-calorie sports drinks. Fats and oils Butter. Cream sauces. Margarine. Salad oils. Plain salad dressings. Olives. Avocados. Mayonnaise. Sweets and desserts Sweet rolls, doughnuts, and sweet breads. Sugar-free desserts sweetened with sugar alcohols such as xylitol and sorbitol. Seasoning and other foods Honey. Hot sauce. Chili powder. Gravy. Cream-based or milk-based soups. Pancakes and waffles. Summary  When you have diarrhea, the foods you eat and your eating habits are very important.  Make sure you get at least 8-10 cups of fluid each day, or enough to keep your urine clear or pale yellow.  Eat bland foods and gradually reintroduce healthy, nutrient-rich foods as tolerated, or as told by your health care provider.  Avoid high-fiber, fried, greasy, or spicy foods. This information is not intended to replace advice given to you by your health care provider. Make sure you discuss any questions you have with your health care provider. Document Released: 06/06/2003 Document Revised: 03/13/2016 Document Reviewed:  03/13/2016 Elsevier Interactive Patient Education  2017 [employer] Hyperglycemia Hyperglycemia occurs when the level of sugar (glucose) in the blood is too high. Glucose is a type of sugar that provides the body's main source of energy. Certain hormones (insulin and glucagon) control the level of glucose in the blood. Insulin lowers blood glucose, and glucagon increases blood glucose. Hyperglycemia can result from having too little insulin in the bloodstream, or from the body not responding normally to insulin. Hyperglycemia occurs most often in people who have diabetes (diabetes mellitus), but it can happen in people who do not have diabetes. It can develop quickly, and it can be life-threatening if it causes you to become severely dehydrated (diabetic ketoacidosis or hyperglycemic hyperosmolar state). Severe hyperglycemia is a medical emergency. What are the causes? If you have diabetes, hyperglycemia may be caused by:  Diabetes medicine.  Medicines that increase blood glucose or affect your diabetes control.  Not eating enough, or not eating often enough.  Changes in physical activity level.  Being sick or having an infection.  If you have prediabetes or undiagnosed diabetes:  Hyperglycemia may be caused by those conditions.  If you do not have diabetes, hyperglycemia may be caused by:  Certain medicines, including steroid medicines, beta-blockers, epinephrine, and thiazide diuretics.  Stress.  Serious illness.  Surgery.  Diseases of the pancreas.  Infection.  What increases the risk? Hyperglycemia is more likely to develop in people who have risk factors for diabetes, such as:  Having a family member with diabetes.  Having a gene for type 1 diabetes that is passed from parent to child (inherited).  Living in an area with cold weather conditions.  Exposure to certain  viruses.  Certain conditions in which the body's disease-fighting (immune) system attacks itself  (autoimmune disorders).  Being overweight or obese.  Having an inactive (sedentary) lifestyle.  Having been diagnosed with insulin resistance.  Having a history of prediabetes, gestational diabetes, or polycystic ovarian syndrome (PCOS).  Being of American-Indian, African-American, Hispanic/Latino, or Asian/Pacific Islander descent.  What are the signs or symptoms? Hyperglycemia may not cause any symptoms. If you do have symptoms, they may include early warning signs, such as:  Increased thirst.  Hunger.  Feeling very tired.  Needing to urinate more often than usual.  Blurry vision.  Other symptoms may develop if hyperglycemia gets worse, such as:  Dry mouth.  Loss of appetite.  Fruity-smelling breath.  Weakness.  Unexpected or rapid weight gain or weight loss.  Tingling or numbness in the hands or feet.  Headache.  Skin that does not quickly return to normal after being lightly pinched and released (poor skin turgor).  Abdominal pain.  Cuts or bruises that are slow to heal.  How is this diagnosed? Hyperglycemia is diagnosed with a blood test to measure your blood glucose level. This blood test is usually done while you are having symptoms. Your health care provider may also do a physical exam and review your medical history. You may have more tests to determine the cause of your hyperglycemia, such as:  A fasting blood glucose (FBG) test. You will not be allowed to eat (you will fast) for at least 8 hours before a blood sample is taken.  An A1c (hemoglobin A1c) blood test. This provides information about blood glucose control over the previous 2-3 months.  An oral glucose tolerance test (OGTT). This measures your blood glucose at two times: ? After fasting. This is your baseline blood glucose level. ? Two hours after drinking a beverage that contains glucose.  How is this treated? Treatment depends on the cause of your hyperglycemia. Treatment may  include:  Taking medicine to regulate your blood glucose levels. If you take insulin or other diabetes medicines, your medicine or dosage may be adjusted.  Lifestyle changes, such as exercising more, eating healthier foods, or losing weight.  Treating an illness or infection, if this caused your hyperglycemia.  Checking your blood glucose more often.  Stopping or reducing steroid medicines, if these caused your hyperglycemia.  If your hyperglycemia becomes severe and it results in hyperglycemic hyperosmolar state, you must be hospitalized and given IV fluids. Follow these instructions at home: General instructions  Take over-the-counter and prescription medicines only as told by your health care provider.  Do not use any products that contain nicotine or tobacco, such as cigarettes and e-cigarettes. If you need help quitting, ask your health care provider.  Limit alcohol intake to no more than 1 drink per day for nonpregnant women and 2 drinks per day for men. One drink equals 12 oz of beer, 5 oz of wine, or 1 oz of hard liquor.  Learn to manage stress. If you need help with this, ask your health care provider.  Keep all follow-up visits as told by your health care provider. This is important. Eating and drinking  Maintain a healthy weight.  Exercise regularly, as directed by your health care provider.  Stay hydrated, especially when you exercise, get sick, or spend time in hot temperatures.  Eat healthy foods, such as: ? Lean proteins. ? Complex carbohydrates. ? Fresh fruits and vegetables. ? Low-fat dairy products. ? Healthy fats.  Drink enough fluid to keep  your urine clear or pale yellow. If you have diabetes:   Make sure you know the symptoms of hyperglycemia.  Follow your diabetes management plan, as told by your health care provider. Make sure you: ? Take your insulin and medicines as directed. ? Follow your exercise plan. ? Follow your meal plan. Eat on time,  and do not skip meals. ? Check your blood glucose as often as directed. Make sure to check your blood glucose before and after exercise. If you exercise longer or in a different way than usual, check your blood glucose more often. ? Follow your sick day plan whenever you cannot eat or drink normally. Make this plan in advance with your health care provider.  Share your diabetes management plan with people in your workplace, school, and household.  Check your urine for ketones when you are ill and as told by your health care provider.  Carry a medical alert card or wear medical alert jewelry. Contact a health care provider if:  Your blood glucose is at or above 240 mg/dL (16.1 mmol/L) for 2 days in a row.  You have problems keeping your blood glucose in your target range.  You have frequent episodes of hyperglycemia. Get help right away if:  You have difficulty breathing.  You have a change in how you think, feel, or act (mental status).  You have nausea or vomiting that does not go away. These symptoms may represent a serious problem that is an emergency. Do not wait to see if the symptoms will go away. Get medical help right away. Call your local emergency services (911 in the [place] Do not drive yourself to the hospital. Summary  Hyperglycemia occurs when the level of sugar (glucose) in the blood is too high.  Hyperglycemia is diagnosed with a blood test to measure your blood glucose level. This blood test is usually done while you are having symptoms. Your health care provider may also do a physical exam and review your medical history.  If you have diabetes, follow your diabetes management plan as told by your health care provider.  Contact your health care provider if you have problems keeping your blood glucose in your target range. This information is not intended to replace advice given to you by your health care provider. Make sure you discuss any questions you have with  your health care provider. Document Released: 09/09/2000 Document Revised: 12/02/2015 Document Reviewed: 12/02/2015 Elsevier Interactive Patient Education  2018 [employer]. Carbohydrate Counting for Diabetes Mellitus, Adult Carbohydrate counting is a method for keeping track of how many carbohydrates you eat. Eating carbohydrates naturally increases the amount of sugar (glucose) in the blood. Counting how many carbohydrates you eat helps keep your blood glucose within normal limits, which helps you manage your diabetes (diabetes mellitus). It is important to know how many carbohydrates you can safely have in each meal. This is different for every person. A diet and nutrition specialist (registered dietitian) can help you make a meal plan and calculate how many carbohydrates you should have at each meal and snack. Carbohydrates are found in the following foods:  Grains, such as breads and cereals.  Dried beans and soy products.  Starchy vegetables, such as potatoes, peas, and corn.  Fruit and fruit juices.  Milk and yogurt.  Sweets and snack foods, such as cake, cookies, candy, chips, and soft drinks.  How do I count carbohydrates? There are two ways to count carbohydrates in food. You can use either of the  methods or a combination of both. Reading "Nutrition Facts" on packaged food The "Nutrition Facts" list is included on the labels of almost all packaged foods and beverages in the [place] It includes:  The serving size.  Information about nutrients in each serving, including the grams (g) of carbohydrate per serving.  To use the "Nutrition Facts":  Decide how many servings you will have.  Multiply the number of servings by the number of carbohydrates per serving.  The resulting number is the total amount of carbohydrates that you will be having.  Learning standard serving sizes of other foods When you eat foods containing carbohydrates that are not packaged or do not include  "Nutrition Facts" on the label, you need to measure the servings in order to count the amount of carbohydrates:  Measure the foods that you will eat with a food scale or measuring cup, if needed.  Decide how many standard-size servings you will eat.  Multiply the number of servings by 15. Most carbohydrate-rich foods have about 15 g of carbohydrates per serving. ? For example, if you eat 8 oz (170 g) of strawberries, you will have eaten 2 servings and 30 g of carbohydrates (2 servings x 15 g = 30 g).  For foods that have more than one food mixed, such as soups and casseroles, you must count the carbohydrates in each food that is included.  The following list contains standard serving sizes of common carbohydrate-rich foods. Each of these servings has about 15 g of carbohydrates:   hamburger bun or  English muffin.   oz (15 mL) syrup.   oz (14 g) jelly.  1 slice of bread.  1 six-inch tortilla.  3 oz (85 g) cooked rice or pasta.  4 oz (113 g) cooked dried beans.  4 oz (113 g) starchy vegetable, such as peas, corn, or potatoes.  4 oz (113 g) hot cereal.  4 oz (113 g) mashed potatoes or  of a large baked potato.  4 oz (113 g) canned or frozen fruit.  4 oz (120 mL) fruit juice.  4-6 crackers.  6 chicken nuggets.  6 oz (170 g) unsweetened dry cereal.  6 oz (170 g) plain fat-free yogurt or yogurt sweetened with artificial sweeteners.  8 oz (240 mL) milk.  8 oz (170 g) fresh fruit or one small piece of fruit.  24 oz (680 g) popped popcorn.  Example of carbohydrate counting Sample meal  3 oz (85 g) chicken breast.  6 oz (170 g) brown rice.  4 oz (113 g) corn.  8 oz (240 mL) milk.  8 oz (170 g) strawberries with sugar-free whipped topping. Carbohydrate calculation 1. Identify the foods that contain carbohydrates: ? Rice. ? Corn. ? Milk. ? Strawberries. 2. Calculate how many servings you have of each food: ? 2 servings rice. ? 1 serving corn. ? 1  serving milk. ? 1 serving strawberries. 3. Multiply each number of servings by 15 g: ? 2 servings rice x 15 g = 30 g. ? 1 serving corn x 15 g = 15 g. ? 1 serving milk x 15 g = 15 g. ? 1 serving strawberries x 15 g = 15 g. 4. Add together all of the amounts to find the total grams of carbohydrates eaten: ? 30 g + 15 g + 15 g + 15 g = 75 g of carbohydrates total. This information is not intended to replace advice given to you by your health care provider. Make sure you discuss  any questions you have with your health care provider. Document Released: 03/16/2005 Document Revised: 10/04/2015 Document Reviewed: 08/28/2015 Elsevier Interactive Patient Education  2018 [employer] Inc. Blood Glucose Monitoring, Adult Monitoring your blood sugar (glucose) helps you manage your diabetes. It also helps you and your health care provider determine how well your diabetes management plan is working. Blood glucose monitoring involves checking your blood glucose as often as directed, and keeping a record (log) of your results over time. Why should I monitor my blood glucose? Checking your blood glucose regularly can:  Help you understand how food, exercise, illnesses, and medicines affect your blood glucose.  Let you know what your blood glucose is at any time. You can quickly tell if you are having low blood glucose (hypoglycemia) or high blood glucose (hyperglycemia).  Help you and your health care provider adjust your medicines as needed.  When should I check my blood glucose? Follow instructions from your health care provider about how often to check your blood glucose. This may depend on:  The type of diabetes you have.  How well-controlled your diabetes is.  Medicines you are taking.  If you have type 1 diabetes:  Check your blood glucose at least 2 times a day.  Also check your blood glucose: ? Before every insulin injection. ? Before and after exercise. ? Between meals. ? 2 hours after a  meal. ? Occasionally between 2:00 a.m. and 3:00 a.m., as directed. ? Before potentially dangerous tasks, like driving or using heavy machinery. ? At bedtime.  You may need to check your blood glucose more often, up to 6-10 times a day: ? If you use an insulin pump. ? If you need multiple daily injections (MDI). ? If your diabetes is not well-controlled. ? If you are ill. ? If you have a history of severe hypoglycemia. ? If you have a history of not knowing when your blood glucose is getting low (hypoglycemia unawareness). If you have type 2 diabetes:  If you take insulin or other diabetes medicines, check your blood glucose at least 2 times a day.  If you are on intensive insulin therapy, check your blood glucose at least 4 times a day. Occasionally, you may also need to check between 2:00 a.m. and 3:00 a.m., as directed.  Also check your blood glucose: ? Before and after exercise. ? Before potentially dangerous tasks, like driving or using heavy machinery.  You may need to check your blood glucose more often if: ? Your medicine is being adjusted. ? Your diabetes is not well-controlled. ? You are ill. What is a blood glucose log?  A blood glucose log is a record of your blood glucose readings. It helps you and your health care provider: ? Look for patterns in your blood glucose over time. ? Adjust your diabetes management plan as needed.  Every time you check your blood glucose, write down your result and notes about things that may be affecting your blood glucose, such as your diet and exercise for the day.  Most glucose meters store a record of glucose readings in the meter. Some meters allow you to download your records to a computer. How do I check my blood glucose? Follow these steps to get accurate readings of your blood glucose: Supplies needed   Blood glucose meter.  Test strips for your meter. Each meter has its own strips. You must use the strips that come with  your meter.  A needle to prick your finger (lancet). Do not  use lancets more than once.  A device that holds the lancet (lancing device).  A journal or log book to write down your results. Procedure  Wash your hands with soap and water.  Prick the side of your finger (not the tip) with the lancet. Use a different finger each time.  Gently rub the finger until a small drop of blood appears.  Follow instructions that come with your meter for inserting the test strip, applying blood to the strip, and using your blood glucose meter.  Write down your result and any notes. Alternative testing sites  Some meters allow you to use areas of your body other than your finger (alternative sites) to test your blood.  If you think you may have hypoglycemia, or if you have hypoglycemia unawareness, do not use alternative sites. Use your finger instead.  Alternative sites may not be as accurate as the fingers, because blood flow is slower in these areas. This means that the result you get may be delayed, and it may be different from the result that you would get from your finger.  The most common alternative sites are: ? Forearm. ? Thigh. ? Palm of the hand. Additional tips  Always keep your supplies with you.  If you have questions or need help, all blood glucose meters have a 24-hour "hotline" number that you can call. You may also contact your health care provider.  After you use a few boxes of test strips, adjust (calibrate) your blood glucose meter by following instructions that came with your meter. This information is not intended to replace advice given to you by your health care provider. Make sure you discuss any questions you have with your health care provider. Document Released: 03/19/2003 Document Revised: 10/04/2015 Document Reviewed: 08/26/2015 Elsevier Interactive Patient Education  2017 [employer].

## 2016-12-31 ENCOUNTER — Ambulatory Visit: Payer: No Typology Code available for payment source | Admitting: *Deleted

## 2016-12-31 DIAGNOSIS — Z794 Long term (current) use of insulin: Principal | ICD-10-CM

## 2016-12-31 DIAGNOSIS — E118 Type 2 diabetes mellitus with unspecified complications: Secondary | ICD-10-CM

## 2016-12-31 LAB — GLUCOSE, POCT (MANUAL RESULT ENTRY): POC Glucose: 600 mg/dl — AB (ref 70–99)

## 2016-12-31 NOTE — Progress Notes (Addendum)
Late entry (12/29/16 at 1515)Pt presented for cbg check insulin injection assistance for alternative site use. Meter reads "HI" (>600). Reports another sausage biscuit and soda for lunch. Counseled again on appropriate diet for DM2. 10units Humalog, max for pt's sliding scale, administered to R upper posterior arm.  NP reported to RN that pt needed pcp closer to his new home in [facility]. [place] 15 min per [employer] from home. accepts pt's insurance, and is on the same epic system allowing information sharing between [facility] clinic and pcp, easier for pt and better for continuity of care. Pt reports he must get HR approval for pcp appointment. Pt declines having RN make appt for him. Pt given phone number and address for office to make appt asap and inform HR for approval.

## 2016-12-31 NOTE — Progress Notes (Signed)
Presented for cbg check and insulin alternate site administration at NP urging, same as previously in week.   CBG meter reading "HI" , >600.   Took Lantus this am but has not taken Humalog since RN saw pt Tuesday afternoon (at least 5 doses missed).  Max sliding scale dose of Humalog adminstered for pt to R upper posterior arm.   Had sausage biscuit for breakfast again this am. Reports trying to drink more water. Reminded pt just drinking water does not help blood sugar being this high. Must adhere to diet and medication regimen as well. Sts he has not picked up needles sent to pharmacy yet because he has a few left, but despite this still has not been taking Humalog. Additional inj site of upper legs offered for home use if abd remains sore, but pt immediately refuses due to pain of one prior injection in that site.   Encouraged pt if brain fog is making it hard to remember dosing schedule or take meds, make a list and put it somewhere highly visible to him and check it off each time he takes his meds.   Encouraged to come to clinic tomorrow for cbg checks and insulin administratio prior to meals.  Flat/blunt affect remains. Simply offers thanks and leaves clinic. Per Epic review, does not appear pt has attempted to make appt with [facility] as RN recommended for new pcp.

## 2017-01-05 ENCOUNTER — Telehealth: Payer: Self-pay | Admitting: Registered Nurse

## 2017-01-05 ENCOUNTER — Encounter: Payer: Self-pay | Admitting: Registered Nurse

## 2017-01-05 NOTE — Telephone Encounter (Signed)
Follow up hyperglycermia/syringe refill.  Patient asked if he could come to clinic today for recheck CBG with RN Rolly Salter.  Patient stated he could later currently busy at work and could not talk at that time.

## 2017-01-06 NOTE — Telephone Encounter (Signed)
Patient case discussed with Dr Julieanne Manson consider Jardiance initiation with patient if he is unwilling/unable to give self insulin injections until he is seen by new PCM.

## 2017-01-07 ENCOUNTER — Ambulatory Visit: Payer: No Typology Code available for payment source | Admitting: Registered Nurse

## 2017-01-07 DIAGNOSIS — Z794 Long term (current) use of insulin: Principal | ICD-10-CM

## 2017-01-07 DIAGNOSIS — E118 Type 2 diabetes mellitus with unspecified complications: Secondary | ICD-10-CM

## 2017-01-07 DIAGNOSIS — R197 Diarrhea, unspecified: Secondary | ICD-10-CM

## 2017-01-07 LAB — GLUCOSE, POCT (MANUAL RESULT ENTRY)

## 2017-01-07 NOTE — Progress Notes (Signed)
Subjective:    Patient ID: Terry Schultz, male    DOB: 1970-08-25, 46 y.o.   MRN: [id number]  46y/o divorced caucasian male established patient here for CBG check today.  Meter read "HI" (>600) when RN Nance Pew checked patient sugar today.  Patient reported he Lost one meter and still doesn't have battery for back up meter so not checking CBG at home.  He Took Lantus at home this am. Sausage mcmuffin this am. Will have same for lunch after appt. 10units Humalog per pt's sliding scale administered to R posterior upper arm. Still not taking Humalog at home. Reasoning per pt being abdomen still sore and lost meter. Patient reported he is taking his oral medications.  He is still having diarrhea and sometimes having to change his clothes due to not being able to make it to the bathroom in time typically at night after work.  Denied fever/chills/LOC/vomiting.  Has been busy at work did not have blood sugar checked 48 hours ago/come to clinic as asked.  Patient reported he scheduled follow up appt with old PCM for 22 Oct.  Patient reported he is drinking water to stay hydrated.      Review of Systems  Constitutional: Negative for activity change, appetite change, chills, diaphoresis, fatigue, fever and unexpected weight change.  HENT: Positive for trouble swallowing and voice change.   Eyes: Negative for photophobia and visual disturbance.  Respiratory: Negative for cough, shortness of breath, wheezing and stridor.   Cardiovascular: Negative for chest pain, palpitations and leg swelling.  Gastrointestinal: Positive for diarrhea. Negative for blood in stool, constipation, nausea and vomiting.  Endocrine: Negative for cold intolerance and heat intolerance.  Genitourinary: Negative for difficulty urinating, dysuria, enuresis and hematuria.  Musculoskeletal: Positive for myalgias. Negative for gait problem, joint swelling, neck pain and neck stiffness.  Skin: Negative for pallor and wound.   Allergic/Immunologic: Positive for environmental allergies and immunocompromised state. Negative for food allergies.  Neurological: Positive for numbness. Negative for dizziness, tremors, seizures, syncope, facial asymmetry, speech difficulty, weakness, light-headedness and headaches.  Hematological: Negative for adenopathy. Does not bruise/bleed easily.  Psychiatric/Behavioral: Negative for agitation, confusion and sleep disturbance.       Objective:   Physical Exam  Constitutional: He is oriented to person, place, and time. Vital signs are normal. He appears well-developed and well-nourished. He is active and cooperative.  Non-toxic appearance. He does not have a sickly appearance. He does not appear ill. No distress.  HENT:  Head: Normocephalic and atraumatic.  Right Ear: Hearing and external ear normal.  Left Ear: Hearing and external ear normal.  Nose: Nose normal. No nose lacerations, nasal deformity, septal deviation or nasal septal hematoma. No epistaxis.  No foreign bodies.  Mouth/Throat: Uvula is midline, oropharynx is clear and moist and mucous membranes are normal. Mucous membranes are not pale, not dry and not cyanotic. He does not have dentures. No oral lesions. No trismus in the jaw. Normal dentition. No dental abscesses, uvula swelling, lacerations or dental caries.  Eyes: Pupils are equal, round, and reactive to light. Conjunctivae, EOM and lids are normal. Right eye exhibits no chemosis, no discharge, no exudate and no hordeolum. No foreign body present in the right eye. Left eye exhibits no chemosis, no discharge, no exudate and no hordeolum. No foreign body present in the left eye. Right conjunctiva is not injected. Right conjunctiva has no hemorrhage. Left conjunctiva is not injected. Left conjunctiva has no hemorrhage. No scleral icterus. Right eye exhibits normal  extraocular motion and no nystagmus. Left eye exhibits normal extraocular motion and no nystagmus. Right pupil is  round and reactive. Left pupil is round and reactive. Pupils are equal.  Neck: Trachea normal, normal range of motion and phonation normal. Neck supple. No neck rigidity. No tracheal deviation, no edema, no erythema and normal range of motion present.  Cardiovascular: Normal rate, regular rhythm, S1 normal, S2 normal, normal heart sounds and intact distal pulses.  PMI is not displaced.  Exam reveals no gallop and no friction rub.   No murmur heard. Pulses:      Radial pulses are 2+ on the right side, and 2+ on the left side.  Pulmonary/Chest: Effort normal and breath sounds normal. No accessory muscle usage or stridor. No respiratory distress. He has no decreased breath sounds. He has no wheezes. He has no rhonchi. He has no rales.  Abdominal: Soft. Normal appearance. He exhibits no distension, no fluid wave and no ascites. There is no rigidity and no guarding.  Musculoskeletal: Normal range of motion. He exhibits no edema, tenderness or deformity.       Right shoulder: Normal.       Left shoulder: Normal.       Right elbow: Normal.      Left elbow: Normal.       Right hip: Normal.       Left hip: Normal.       Right knee: Normal.       Left knee: Normal.       Cervical back: Normal.       Thoracic back: Normal.       Lumbar back: Normal.       Right hand: Normal.       Left hand: Normal.  Gait slow and steady in hall; in/out of chair without difficulty  Lymphadenopathy:       Head (right side): No submental, no submandibular, no tonsillar, no preauricular, no posterior auricular and no occipital adenopathy present.       Head (left side): No submental, no submandibular, no tonsillar, no preauricular, no posterior auricular and no occipital adenopathy present.    He has no cervical adenopathy.       Right cervical: No superficial cervical, no deep cervical and no posterior cervical adenopathy present.      Left cervical: No superficial cervical, no deep cervical and no posterior cervical  adenopathy present.  Neurological: He is alert and oriented to person, place, and time. He has normal strength. He is not disoriented. He displays no atrophy and no tremor. No cranial nerve deficit. He exhibits normal muscle tone. He displays no seizure activity. Coordination and gait normal. GCS eye subscore is 4. GCS verbal subscore is 5. GCS motor subscore is 6.  Skin: Skin is warm, dry and intact. No abrasion, no bruising, no burn, no ecchymosis, no laceration and no petechiae noted. He is not diaphoretic. No cyanosis or erythema. No pallor. Nails show no clubbing.  Psychiatric: He has a normal mood and affect. His speech is normal and behavior is normal. Judgment and thought content normal. Cognition and memory are normal.  Nursing note and vitals reviewed.         Assessment & Plan:  A-uncontrolled type II diabetes mellitus with complication, diarrhea  P-Patient stating he does not administer insulin due to pain with injections.  Discussed rotating injection sites, coming to clinic to have RN assist him/inject to alternate sites and check CBG for sliding scale when at work.  Patient reported he did pick up syringe refill from pharmacy. Keep scheduled appt with PCM 22 Oct.  Consider starting another po medication if he is not going to administer his insulin.  Jardiance  po qam #30 RF0 electronic Rx to [facility] for trial.  Patient stated is medication too expensive he is not going to start it.  Patient given Good Rx coupon also to see if that helps to lower his cost.  Encouraged patient to take all of his prescribed oral medications and that he probably needs his insulin at this time also due to elevated blood sugars.  Avoid dehydration. Healthy food choices with less added sugar/concentrated sugars.  Discussed possible side effects of Jardiance with patient per Up to Date most common UTI, GI.  Discussed taking more than one diabetes medication increases his risk  for low blood sugar but due to his elevated serum levels I do not believe he is at high risk for hypoglycemia and we are starting with lowest dose of medication.  Discussed if his GFR decreases below 45 Jardiance would need to be stopped. [employer] handout on carbohydrate counting and foods to relieve diarrhea given to patient.  Discussed with patient diarrhea related to less healthy food choices and very high serum blood sugars more than likely.  If no resolution with decreased blood sugars recommend follow up with GI/PCM. Go to ER if LOC/chest pain/SOB/vomiting/pooping/peeing blood. Patient verbalized understanding information/instructions, agreed with plan of care and had no further questions at this time.

## 2017-01-08 ENCOUNTER — Ambulatory Visit: Payer: Self-pay | Admitting: *Deleted

## 2017-01-08 DIAGNOSIS — Z794 Long term (current) use of insulin: Principal | ICD-10-CM

## 2017-01-08 DIAGNOSIS — E118 Type 2 diabetes mellitus with unspecified complications: Secondary | ICD-10-CM

## 2017-01-08 LAB — GLUCOSE, POCT (MANUAL RESULT ENTRY): POC Glucose: >600 mg/dl (ref 70–99)
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
## 2017-01-09 MED ORDER — EMPAGLIFLOZIN 10 MG PO TABS: 10.0000 mg | ORAL_TABLET | Freq: Every day | ORAL | 0 refills | Status: DC

## 2017-01-09 NOTE — Patient Instructions (Signed)
Food Choices to Help Relieve Diarrhea, Adult When you have diarrhea, the foods you eat and your eating habits are very important. Choosing the right foods and drinks can help:  Relieve diarrhea.  Replace lost fluids and nutrients.  Prevent dehydration.  What general guidelines should I follow? Relieving diarrhea  Choose foods with less than 2 g or .07 oz. of fiber per serving.  Limit fats to less than 8 tsp (38 g or 1.34 oz.) a day.  Avoid the following: ? Foods and beverages sweetened with high-fructose corn syrup, honey, or sugar alcohols such as xylitol, sorbitol, and mannitol. ? Foods that contain a lot of fat or sugar. ? Fried, greasy, or spicy foods. ? High-fiber grains, breads, and cereals. ? Raw fruits and vegetables.  Eat foods that are rich in probiotics. These foods include dairy products such as yogurt and fermented milk products. They help increase healthy bacteria in the stomach and intestines (gastrointestinal tract, or GI tract).  If you have lactose intolerance, avoid dairy products. These may make your diarrhea worse.  Take medicine to help stop diarrhea (antidiarrheal medicine) only as told by your health care provider. Replacing nutrients  Eat small meals or snacks every 3-4 hours.  Eat bland foods, such as white rice, toast, or baked potato, until your diarrhea starts to get better. Gradually reintroduce nutrient-rich foods as tolerated or as told by your health care provider. This includes: ? Well-cooked protein foods. ? Peeled, seeded, and soft-cooked fruits and vegetables. ? Low-fat dairy products.  Take vitamin and mineral supplements as told by your health care provider. Preventing dehydration   Start by sipping water or a special solution to prevent dehydration (oral rehydration solution, ORS). Urine that is clear or pale yellow means that you are getting enough fluid.  Try to drink at least 8-10 cups of fluid each day to help replace lost  fluids.  You may add other liquids in addition to water, such as clear juice or decaffeinated sports drinks, as tolerated or as told by your health care provider.  Avoid drinks with caffeine, such as coffee, tea, or soft drinks.  Avoid alcohol. What foods are recommended? The items listed may not be a complete list. Talk with your health care provider about what dietary choices are best for you. Grains White rice. White, French, or pita breads (fresh or toasted), including plain rolls, buns, or bagels. White pasta. Saltine, soda, or graham crackers. Pretzels. Low-fiber cereal. Cooked cereals made with water (such as cornmeal, farina, or cream cereals). Plain muffins. Matzo. Melba toast. Zwieback. Vegetables Potatoes (without the skin). Most well-cooked and canned vegetables without skins or seeds. Tender lettuce. Fruits Apple sauce. Fruits canned in juice. Cooked apricots, cherries, grapefruit, peaches, pears, or plums. Fresh bananas and cantaloupe. Meats and other protein foods Baked or boiled chicken. Eggs. Tofu. Fish. Seafood. Smooth nut butters. Ground or well-cooked tender beef, ham, veal, lamb, pork, or poultry. Dairy Plain yogurt, kefir, and unsweetened liquid yogurt. Lactose-free milk, buttermilk, skim milk, or soy milk. Low-fat or nonfat hard cheese. Beverages Water. Low-calorie sports drinks. Fruit juices without pulp. Strained tomato and vegetable juices. Decaffeinated teas. Sugar-free beverages not sweetened with sugar alcohols. Oral rehydration solutions, if approved by your health care provider. Seasoning and other foods Bouillon, broth, or soups made from recommended foods. What foods are not recommended? The items listed may not be a complete list. Talk with your health care provider about what dietary choices are best for you. Grains Whole grain, whole wheat,   bran, or rye breads, rolls, pastas, and crackers. Wild or brown rice. Whole grain or bran cereals. Barley. Oats and  oatmeal. Corn tortillas or taco shells. Granola. Popcorn. Vegetables Raw vegetables. Fried vegetables. Cabbage, broccoli, Brussels sprouts, artichokes, baked beans, beet greens, corn, kale, legumes, peas, sweet potatoes, and yams. Potato skins. Cooked spinach and cabbage. Fruits Dried fruit, including raisins and dates. Raw fruits. Stewed or dried prunes. Canned fruits with syrup. Meat and other protein foods Fried or fatty meats. Deli meats. Chunky nut butters. Nuts and seeds. Beans and lentils. Tomasa Blase. Hot dogs. Sausage. Dairy High-fat cheeses. Whole milk, chocolate milk, and beverages made with milk, such as milk shakes. Half-and-half. Cream. sour cream. Ice cream. Beverages Caffeinated beverages (such as coffee, tea, soda, or energy drinks). Alcoholic beverages. Fruit juices with pulp. Prune juice. Soft drinks sweetened with high-fructose corn syrup or sugar alcohols. High-calorie sports drinks. Fats and oils Butter. Cream sauces. Margarine. Salad oils. Plain salad dressings. Olives. Avocados. Mayonnaise. Sweets and desserts Sweet rolls, doughnuts, and sweet breads. Sugar-free desserts sweetened with sugar alcohols such as xylitol and sorbitol. Seasoning and other foods Honey. Hot sauce. Chili powder. Gravy. Cream-based or milk-based soups. Pancakes and waffles. Summary  When you have diarrhea, the foods you eat and your eating habits are very important.  Make sure you get at least 8-10 cups of fluid each day, or enough to keep your urine clear or pale yellow.  Eat bland foods and gradually reintroduce healthy, nutrient-rich foods as tolerated, or as told by your health care provider.  Avoid high-fiber, fried, greasy, or spicy foods. This information is not intended to replace advice given to you by your health care provider. Make sure you discuss any questions you have with your health care provider. Document Released: 06/06/2003 Document Revised: 03/13/2016 Document Reviewed:  03/13/2016 Elsevier Interactive Patient Education  2017 [employer] Carbohydrate Counting for Diabetes Mellitus, Adult Carbohydrate counting is a method for keeping track of how many carbohydrates you eat. Eating carbohydrates naturally increases the amount of sugar (glucose) in the blood. Counting how many carbohydrates you eat helps keep your blood glucose within normal limits, which helps you manage your diabetes (diabetes mellitus). It is important to know how many carbohydrates you can safely have in each meal. This is different for every person. A diet and nutrition specialist (registered dietitian) can help you make a meal plan and calculate how many carbohydrates you should have at each meal and snack. Carbohydrates are found in the following foods:  Grains, such as breads and cereals.  Dried beans and soy products.  Starchy vegetables, such as potatoes, peas, and corn.  Fruit and fruit juices.  Milk and yogurt.  Sweets and snack foods, such as cake, cookies, candy, chips, and soft drinks.  How do I count carbohydrates? There are two ways to count carbohydrates in food. You can use either of the methods or a combination of both. Reading "Nutrition Facts" on packaged food The "Nutrition Facts" list is included on the labels of almost all packaged foods and beverages in the [place] It includes:  The serving size.  Information about nutrients in each serving, including the grams (g) of carbohydrate per serving.  To use the "Nutrition Facts":  Decide how many servings you will have.  Multiply the number of servings by the number of carbohydrates per serving.  The resulting number is the total amount of carbohydrates that you will be having.  Learning standard serving sizes of other foods When you eat foods  containing carbohydrates that are not packaged or do not include "Nutrition Facts" on the label, you need to measure the servings in order to count the amount of  carbohydrates:  Measure the foods that you will eat with a food scale or measuring cup, if needed.  Decide how many standard-size servings you will eat.  Multiply the number of servings by 15. Most carbohydrate-rich foods have about 15 g of carbohydrates per serving. ? For example, if you eat 8 oz (170 g) of strawberries, you will have eaten 2 servings and 30 g of carbohydrates (2 servings x 15 g = 30 g).  For foods that have more than one food mixed, such as soups and casseroles, you must count the carbohydrates in each food that is included.  The following list contains standard serving sizes of common carbohydrate-rich foods. Each of these servings has about 15 g of carbohydrates:   hamburger bun or  English muffin.   oz (15 mL) syrup.   oz (14 g) jelly.  1 slice of bread.  1 six-inch tortilla.  3 oz (85 g) cooked rice or pasta.  4 oz (113 g) cooked dried beans.  4 oz (113 g) starchy vegetable, such as peas, corn, or potatoes.  4 oz (113 g) hot cereal.  4 oz (113 g) mashed potatoes or  of a large baked potato.  4 oz (113 g) canned or frozen fruit.  4 oz (120 mL) fruit juice.  4-6 crackers.  6 chicken nuggets.  6 oz (170 g) unsweetened dry cereal.  6 oz (170 g) plain fat-free yogurt or yogurt sweetened with artificial sweeteners.  8 oz (240 mL) milk.  8 oz (170 g) fresh fruit or one small piece of fruit.  24 oz (680 g) popped popcorn.  Example of carbohydrate counting Sample meal  3 oz (85 g) chicken breast.  6 oz (170 g) brown rice.  4 oz (113 g) corn.  8 oz (240 mL) milk.  8 oz (170 g) strawberries with sugar-free whipped topping. Carbohydrate calculation 1. Identify the foods that contain carbohydrates: ? Rice. ? Corn. ? Milk. ? Strawberries. 2. Calculate how many servings you have of each food: ? 2 servings rice. ? 1 serving corn. ? 1 serving milk. ? 1 serving strawberries. 3. Multiply each number of servings by 15 g: ? 2 servings  rice x 15 g = 30 g. ? 1 serving corn x 15 g = 15 g. ? 1 serving milk x 15 g = 15 g. ? 1 serving strawberries x 15 g = 15 g. 4. Add together all of the amounts to find the total grams of carbohydrates eaten: ? 30 g + 15 g + 15 g + 15 g = 75 g of carbohydrates total. This information is not intended to replace advice given to you by your health care provider. Make sure you discuss any questions you have with your health care provider. Document Released: 03/16/2005 Document Revised: 10/04/2015 Document Reviewed: 08/28/2015 Elsevier Interactive Patient Education  [employer].

## 2017-01-12 ENCOUNTER — Telehealth: Payer: Self-pay | Admitting: Registered Nurse

## 2017-01-12 NOTE — Telephone Encounter (Signed)
Patient contacted stated he did not pick up jardiance at pharmacy this weekend and had stayed in his apt all weekend because he was unable to lock his apt door due to losing his apt key.  Patient drinking ginger ale this morning stating he is unable to find diet ginger ale for purchase.  Patient reported symptoms have not changed.  Asked patient to come to clinic for RN visit/blood sugar check.  Patient reported he would come to clinic.  Patient reported he took his pills but not his insulin this am.  Patient verbalized understanding of instructions and had no further questions at this time.

## 2017-01-14 NOTE — Telephone Encounter (Signed)
Pt did not show for breakfast or lunch insulin inj assistance. Pt was not at desk when RN was in warehouse in afternoon.

## 2017-01-18 ENCOUNTER — Ambulatory Visit: Payer: Self-pay | Admitting: *Deleted

## 2017-01-18 DIAGNOSIS — Z794 Long term (current) use of insulin: Principal | ICD-10-CM

## 2017-01-18 DIAGNOSIS — E118 Type 2 diabetes mellitus with unspecified complications: Secondary | ICD-10-CM

## 2017-01-18 LAB — GLUCOSE, POCT (MANUAL RESULT ENTRY): POC GLUCOSE: 463 mg/dL — AB (ref 70–99)

## 2017-01-18 NOTE — Progress Notes (Signed)
Pt presents requesting help with am insulin injection. Reports having sausage mcmuffin for breakfast this am. CBG check 463. 12 units Humalog dose administered R lateral upper leg. Per pt, found notes on sliding scale dosing and reports that if cbg is over 400, dose can be increased to 12 units instead of max of 10.  Scheduled to see pcp this afternoon. Sts he is leaving work at [employer] for appt.

## 2017-01-19 ENCOUNTER — Ambulatory Visit: Payer: Self-pay | Admitting: *Deleted

## 2017-01-19 ENCOUNTER — Emergency Department (HOSPITAL_COMMUNITY): Payer: No Typology Code available for payment source

## 2017-01-19 ENCOUNTER — Inpatient Hospital Stay (HOSPITAL_COMMUNITY)
Admission: EM | Admit: 2017-01-19 | Discharge: 2017-01-21 | DRG: 637 | Disposition: A | Discharge status: Home / Self Care | Payer: No Typology Code available for payment source | Attending: Internal Medicine | Admitting: Internal Medicine

## 2017-01-19 ENCOUNTER — Encounter (HOSPITAL_COMMUNITY): Payer: Self-pay

## 2017-01-19 DIAGNOSIS — Z9119 Patient's noncompliance with other medical treatment and regimen: Secondary | ICD-10-CM

## 2017-01-19 DIAGNOSIS — I10 Essential (primary) hypertension: Secondary | ICD-10-CM | POA: Diagnosis present

## 2017-01-19 DIAGNOSIS — F17210 Nicotine dependence, cigarettes, uncomplicated: Secondary | ICD-10-CM | POA: Diagnosis present

## 2017-01-19 DIAGNOSIS — Z9114 Patient's other noncompliance with medication regimen: Secondary | ICD-10-CM

## 2017-01-19 DIAGNOSIS — L89151 Pressure ulcer of sacral region, stage 1: Secondary | ICD-10-CM | POA: Diagnosis present

## 2017-01-19 DIAGNOSIS — E118 Type 2 diabetes mellitus with unspecified complications: Secondary | ICD-10-CM

## 2017-01-19 DIAGNOSIS — E1310 Other specified diabetes mellitus with ketoacidosis without coma: Secondary | ICD-10-CM

## 2017-01-19 DIAGNOSIS — F32A Depression, unspecified: Secondary | ICD-10-CM | POA: Diagnosis present

## 2017-01-19 DIAGNOSIS — Y908 Blood alcohol level of 240 mg/100 ml or more: Secondary | ICD-10-CM | POA: Diagnosis present

## 2017-01-19 DIAGNOSIS — R197 Diarrhea, unspecified: Secondary | ICD-10-CM | POA: Diagnosis not present

## 2017-01-19 DIAGNOSIS — E876 Hypokalemia: Secondary | ICD-10-CM | POA: Diagnosis present

## 2017-01-19 DIAGNOSIS — Z794 Long term (current) use of insulin: Secondary | ICD-10-CM

## 2017-01-19 DIAGNOSIS — Z79899 Other long term (current) drug therapy: Secondary | ICD-10-CM

## 2017-01-19 DIAGNOSIS — E1100 Type 2 diabetes mellitus with hyperosmolarity without nonketotic hyperglycemic-hyperosmolar coma (NKHHC): Secondary | ICD-10-CM | POA: Diagnosis present

## 2017-01-19 DIAGNOSIS — F10229 Alcohol dependence with intoxication, unspecified: Secondary | ICD-10-CM | POA: Diagnosis present

## 2017-01-19 DIAGNOSIS — E1142 Type 2 diabetes mellitus with diabetic polyneuropathy: Secondary | ICD-10-CM | POA: Diagnosis present

## 2017-01-19 DIAGNOSIS — F419 Anxiety disorder, unspecified: Secondary | ICD-10-CM | POA: Diagnosis present

## 2017-01-19 DIAGNOSIS — E1110 Type 2 diabetes mellitus with ketoacidosis without coma: Secondary | ICD-10-CM | POA: Diagnosis not present

## 2017-01-19 DIAGNOSIS — E43 Unspecified severe protein-calorie malnutrition: Secondary | ICD-10-CM | POA: Insufficient documentation

## 2017-01-19 DIAGNOSIS — F329 Major depressive disorder, single episode, unspecified: Secondary | ICD-10-CM | POA: Diagnosis present

## 2017-01-19 DIAGNOSIS — Z681 Body mass index (BMI) 19 or less, adult: Secondary | ICD-10-CM

## 2017-01-19 DIAGNOSIS — L8990 Pressure ulcer of unspecified site, unspecified stage: Secondary | ICD-10-CM | POA: Insufficient documentation

## 2017-01-19 LAB — CBC
HEMATOCRIT: 39.9 % (ref 39.0–52.0)
Hemoglobin: 14.1 g/dL (ref 13.0–17.0)
MCH: 30.9 pg (ref 26.0–34.0)
MCHC: 35.3 g/dL (ref 30.0–36.0)
MCV: 87.3 fL (ref 78.0–100.0)
Platelets: 107 10*3/uL — ABNORMAL LOW (ref 150–400)
RBC: 4.57 MIL/uL (ref 4.22–5.81)
RDW: 13.9 % (ref 11.5–15.5)
WBC: 4.8 10*3/uL (ref 4.0–10.5)

## 2017-01-19 LAB — BASIC METABOLIC PANEL
Anion gap: 13 (ref 5–15)
CO2: 28 mmol/L (ref 22–32)
CREATININE: 0.48 mg/dL — AB (ref 0.61–1.24)
Calcium: 7.6 mg/dL — ABNORMAL LOW (ref 8.9–10.3)
Chloride: 97 mmol/L — ABNORMAL LOW (ref 101–111)
GFR calc Af Amer: >60 mL/min (ref >60)
GLUCOSE: 209 mg/dL — AB (ref 65–99)
POTASSIUM: 2 mmol/L — AB (ref 3.5–5.1)
Sodium: 138 mmol/L (ref 135–145)

## 2017-01-19 LAB — URINALYSIS, ROUTINE W REFLEX MICROSCOPIC
BACTERIA UA: NONE SEEN
Bilirubin Urine: NEGATIVE
Glucose, UA: >=500 mg/dL — AB
Ketones, ur: 5 mg/dL — AB
Leukocytes, UA: NEGATIVE
Nitrite: NEGATIVE
PROTEIN: NEGATIVE mg/dL
SPECIFIC GRAVITY, URINE: 1.026 (ref 1.005–1.030)
Squamous Epithelial / LPF: NONE SEEN
pH: 7 (ref 5.0–8.0)

## 2017-01-19 LAB — BLOOD GAS, ARTERIAL
ACID-BASE EXCESS: 2.8 mmol/L — AB (ref 0.0–2.0)
Bicarbonate: 27.1 mmol/L (ref 20.0–28.0)
DRAWN BY: 52075
FIO2: 0.21
O2 Saturation: 95.4 %
PH ART: 7.406 (ref 7.350–7.450)
Patient temperature: 98.6
pCO2 arterial: 44 mmHg (ref 32.0–48.0)
pO2, Arterial: 80.5 mmHg — ABNORMAL LOW (ref 83.0–108.0)

## 2017-01-19 LAB — COMPREHENSIVE METABOLIC PANEL
ALBUMIN: 4.1 g/dL (ref 3.5–5.0)
ALK PHOS: 164 U/L — AB (ref 38–126)
ALT: 64 U/L — AB (ref 17–63)
AST: 82 U/L — AB (ref 15–41)
Anion gap: 23 — ABNORMAL HIGH (ref 5–15)
BILIRUBIN TOTAL: 1.3 mg/dL — AB (ref 0.3–1.2)
BUN: 5 mg/dL — AB (ref 6–20)
CALCIUM: 9.2 mg/dL (ref 8.9–10.3)
CO2: 31 mmol/L (ref 22–32)
CREATININE: 0.88 mg/dL (ref 0.61–1.24)
Chloride: 79 mmol/L — ABNORMAL LOW (ref 101–111)
GFR calc Af Amer: >60 mL/min (ref >60)
GLUCOSE: 788 mg/dL — AB (ref 65–99)
Potassium: 3 mmol/L — ABNORMAL LOW (ref 3.5–5.1)
Sodium: 133 mmol/L — ABNORMAL LOW (ref 135–145)
TOTAL PROTEIN: 6.5 g/dL (ref 6.5–8.1)

## 2017-01-19 LAB — CBG MONITORING, ED
GLUCOSE-CAPILLARY: 552 mg/dL — AB (ref 65–99)
GLUCOSE-CAPILLARY: 561 mg/dL — AB (ref 65–99)
GLUCOSE-CAPILLARY: 413 mg/dL — AB (ref 65–99)
GLUCOSE-CAPILLARY: 367 mg/dL — AB (ref 65–99)
Glucose-Capillary: 464 mg/dL — ABNORMAL HIGH (ref 65–99)
Glucose-Capillary: 249 mg/dL — ABNORMAL HIGH (ref 65–99)

## 2017-01-19 LAB — GLUCOSE, CAPILLARY
Glucose-Capillary: 258 mg/dL — ABNORMAL HIGH (ref 65–99)
Glucose-Capillary: 226 mg/dL — ABNORMAL HIGH (ref 65–99)

## 2017-01-19 LAB — GLUCOSE, POCT (MANUAL RESULT ENTRY)

## 2017-01-19 LAB — ETHANOL: Alcohol, Ethyl (B): 346 mg/dL (ref <10)

## 2017-01-19 LAB — MRSA PCR SCREENING: MRSA BY PCR: NEGATIVE

## 2017-01-19 LAB — SALICYLATE LEVEL: Salicylate Lvl: <7 mg/dL (ref 2.8–30.0)

## 2017-01-19 LAB — ACETAMINOPHEN LEVEL: Acetaminophen (Tylenol), Serum: <10 ug/mL — ABNORMAL LOW (ref 10–30)

## 2017-01-19 MED ORDER — ARIPIPRAZOLE 5 MG PO TABS
5.0000 mg | ORAL_TABLET | Freq: Every day | ORAL | Status: DC
  Administered 2017-01-20 – 2017-01-21 (×2): 5 mg via ORAL
  Filled 2017-01-19 (×5): qty 1

## 2017-01-19 MED ORDER — POTASSIUM CHLORIDE CRYS ER 10 MEQ PO TBCR
40.0000 meq | EXTENDED_RELEASE_TABLET | ORAL | Status: AC
  Administered 2017-01-19 – 2017-01-20 (×2): 40 meq via ORAL
  Filled 2017-01-19 (×2): qty 4
  Filled 2017-01-19: qty 2

## 2017-01-19 MED ORDER — CARVEDILOL 12.5 MG PO TABS
12.5000 mg | ORAL_TABLET | Freq: Two times a day (BID) | ORAL | Status: DC
  Administered 2017-01-19 – 2017-01-21 (×4): 12.5 mg via ORAL
  Filled 2017-01-19 (×4): qty 1

## 2017-01-19 MED ORDER — ONDANSETRON HCL 4 MG/2ML IJ SOLN
4.0000 mg | INTRAMUSCULAR | Status: DC | PRN
  Administered 2017-01-19 – 2017-01-20 (×3): 4 mg via INTRAVENOUS
  Filled 2017-01-19 (×4): qty 2

## 2017-01-19 MED ORDER — SODIUM CHLORIDE 0.9 % IV SOLN
INTRAVENOUS | Status: DC
  Administered 2017-01-19: 15:00:00 via INTRAVENOUS

## 2017-01-19 MED ORDER — FENOFIBRATE 160 MG PO TABS
160.0000 mg | ORAL_TABLET | Freq: Every day | ORAL | Status: DC
  Administered 2017-01-20 – 2017-01-21 (×2): 160 mg via ORAL
  Filled 2017-01-19 (×2): qty 1

## 2017-01-19 MED ORDER — DEXTROSE-NACL 5-0.45 % IV SOLN
INTRAVENOUS | Status: DC
  Administered 2017-01-19: 21:00:00 via INTRAVENOUS

## 2017-01-19 MED ORDER — POTASSIUM CHLORIDE 10 MEQ/100ML IV SOLN
10.0000 meq | INTRAVENOUS | Status: AC
  Administered 2017-01-19 (×4): 10 meq via INTRAVENOUS
  Filled 2017-01-19 (×4): qty 100

## 2017-01-19 MED ORDER — SODIUM CHLORIDE 0.9 % IV BOLUS (SEPSIS)
1000.0000 mL | Freq: Once | INTRAVENOUS | Status: AC
  Administered 2017-01-19: 1000 mL via INTRAVENOUS

## 2017-01-19 MED ORDER — ESCITALOPRAM OXALATE 10 MG PO TABS
10.0000 mg | ORAL_TABLET | Freq: Every day | ORAL | Status: DC
  Administered 2017-01-20: 10 mg via ORAL
  Filled 2017-01-19: qty 1

## 2017-01-19 MED ORDER — SODIUM CHLORIDE 0.9 % IV SOLN
INTRAVENOUS | Status: DC
  Administered 2017-01-19: 5 [IU]/h via INTRAVENOUS
  Filled 2017-01-19: qty 1

## 2017-01-19 MED ORDER — LAMOTRIGINE 25 MG PO TABS
25.0000 mg | ORAL_TABLET | Freq: Every day | ORAL | Status: DC
  Administered 2017-01-20 – 2017-01-21 (×2): 25 mg via ORAL
  Filled 2017-01-19 (×2): qty 1

## 2017-01-19 MED ORDER — ACETAMINOPHEN 325 MG PO TABS
650.0000 mg | ORAL_TABLET | Freq: Four times a day (QID) | ORAL | Status: DC | PRN
  Administered 2017-01-19 – 2017-01-20 (×3): 650 mg via ORAL
  Filled 2017-01-19 (×3): qty 2

## 2017-01-19 MED ORDER — POTASSIUM CHLORIDE CRYS ER 20 MEQ PO TBCR: 80.0000 meq | EXTENDED_RELEASE_TABLET | Freq: Once | ORAL | Status: DC

## 2017-01-19 MED ORDER — ENOXAPARIN SODIUM 40 MG/0.4ML ~~LOC~~ SOLN
40.0000 mg | SUBCUTANEOUS | Status: DC
  Administered 2017-01-19 – 2017-01-20 (×2): 40 mg via SUBCUTANEOUS
  Filled 2017-01-19 (×2): qty 0.4

## 2017-01-19 MED ORDER — POTASSIUM CHLORIDE 10 MEQ/100ML IV SOLN
10.0000 meq | INTRAVENOUS | Status: AC
  Administered 2017-01-19 – 2017-01-20 (×2): 10 meq via INTRAVENOUS
  Filled 2017-01-19 (×2): qty 100

## 2017-01-19 NOTE — ED Provider Notes (Signed)
[facility] EMERGENCY DEPARTMENT Provider Note   CSN: [id number] Arrival date & time: 01/19/17  1107     History   Chief Complaint Chief Complaint  Patient presents with  . Abnormal Lab  . Weakness    HPI  Terry Schultz is a 46 y.o. male with a history of diabetes, pancreatitis and alcoholism, who presents called by his doctor at [facility] asked night reporting a potassium of 2.5, patient was instructed to go straight to the emergency department. He presents today complaining of some dizziness, patient reports when he was leaving his apartment this morning he fell in the parking lot and hit his head, minimal pain, denies any vision changes, nausea vomiting, numbness tingling or weakness in extremities. Denies any scrapes or abrasions.  Patient reports he started to feel little bit confused since arriving in the ED.  Blood sugar is significantly elevated, patient reports he took his diabetes medications yesterday, but did not take them for the past few days as he "just doesn't care".  Reports yesterday his blood sugar was in the 400s.  Patient denies any chest pain shortness of breath, fevers, abdominal pain, nausea or vomiting, diarrhea. URI symptoms. Patient denies any alcohol use in the last few days.       Past Medical History:  Diagnosis Date  . Alcoholism (HCC)   . Diabetes mellitus without complication (HCC)   . Pancreatitis     Patient Active Problem List   Diagnosis Date Noted  . DKA (diabetic ketoacidoses) (HCC) 12/05/2016  . Nausea vomiting and diarrhea 12/05/2016  . Depression with anxiety 12/05/2016  . HLD (hyperlipidemia) 12/05/2016  . Chronic back pain 12/05/2016  . SIRS (systemic inflammatory response syndrome) (HCC) 12/05/2016  . Diabetes mellitus without complication (HCC)   . Alcoholism (HCC)   . AKI (acute kidney injury) (HCC)   . Hypomagnesemia 05/29/2016  . Cervical myelopathy (HCC) 05/28/2016  . Ambulatory dysfunction  05/27/2016  . Hyperglycemia 05/27/2016  . Numbness and tingling of both legs 05/27/2016  . Chronic pain of both knees 05/21/2016  . Diabetic peripheral neuropathy (HCC) 05/21/2016  . Moderate recurrent major depression (HCC) 05/22/2015  . Panic disorder 05/22/2015  . Hepatic steatosis 11/21/2013  . Nondependent alcohol abuse, in remission 09/30/2013  . Allergic rhinitis 09/30/2013  . Anxiety disorder 09/30/2013  . Chronic cholecystitis 09/30/2013  . Disorder of magnesium metabolism 09/30/2013  . Essential hypertension 09/30/2013  . Familial hypertriglyceridemia 09/30/2013  . Hypercholesterolemia 09/30/2013  . Hypothyroidism 09/30/2013  . Insomnia 09/30/2013  . Low testosterone 09/30/2013  . OSA (obstructive sleep apnea) 09/30/2013  . Pancreatic pseudocyst 09/30/2013  . Depressive disorder 09/30/2013  . Recurrent pancreatitis (HCC) 09/30/2013  . Uncontrolled type 2 diabetes mellitus with diabetic polyneuropathy, with long-term current use of insulin (HCC) 09/30/2013  . Vitamin D deficiency 09/30/2013    Past Surgical History:  Procedure Laterality Date  . CHOLECYSTECTOMY         Home Medications    Prior to Admission medications   Medication Sig Start Date End Date Taking? Authorizing Provider  ARIPiprazole (ABILIFY) 5 MG tablet Take 5 mg by mouth daily.   Yes [provider]  BAYER MICROLET LANCETS lancets 1 each by Other route 3 (three) times daily. Puncture skin 10/03/16  Yes [provider]  canagliflozin (INVOKANA) 100 MG TABS tablet Take 100 mg by mouth daily. 08/20/14  Yes [provider]  carvedilol (COREG) 12.5 MG tablet Take 12.5 mg by mouth 2 (two) times daily. 07/11/15  Yes [provider]  empagliflozin (JARDIANCE) 10 MG TABS tablet Take 10 mg by mouth daily. 01/09/17 02/08/17 Yes Betancourt, Jarold Song, NP  escitalopram (LEXAPRO) 10 MG tablet Take 10 mg by mouth 2 (two) times daily.   Yes [provider]  fenofibrate 160  MG tablet Take 160 mg by mouth. 06/04/16  Yes [provider]  gabapentin (NEURONTIN) 300 MG capsule Take 600 mg by mouth 3 (three) times daily.  05/30/16  Yes [provider]  glipiZIDE (GLUCOTROL) 10 MG tablet Take 10 mg by mouth. 05/30/16  Yes [provider]  glucose blood (BAYER CONTOUR TEST) test strip Apply 1 strip topically 3 (three) times daily as needed. 04/10/14  Yes [provider]  Insulin Glargine (LANTUS SOLOSTAR) 100 UNIT/ML Solostar Pen Inject 30 units subcutaneously twice daily 06/11/16  Yes [provider]  insulin lispro (HUMALOG) 100 UNIT/ML injection Inject 0-10 Units into the skin 3 (three) times daily before meals. Per sliding scale   Yes [provider]  Insulin Syringe-Needle U-100 30G X 1/2" 0.3 ML MISC 1 Syringe by Does not apply route 3 (three) times daily as needed (with meals). 12/29/16  Yes Betancourt, Jarold Song, NP  lamoTRIgine (LAMICTAL) 25 MG tablet Take 25 mg by mouth daily. 10/23/16  Yes [provider]  methocarbamol (ROBAXIN) 500 MG tablet Take 500 mg by mouth 2 (two) times daily as needed. 11/09/16  Yes [provider]  nabumetone (RELAFEN) 500 MG tablet Take 500 mg by mouth 2 (two) times daily.   Yes [provider]  omeprazole (PRILOSEC) 20 MG capsule Take 20 mg by mouth daily. 07/23/14  Yes [provider]  traZODone (DESYREL) 50 MG tablet Take 100 mg by mouth at bedtime.  07/11/15  Yes [provider]  DULoxetine (CYMBALTA) 30 MG capsule Take 30 mg by mouth 2 (two) times daily. 01/17/16 01/16/17  [provider]    Family History Family History  Problem Relation Age of Onset  . Kidney Stones Father     Social History Social History  Substance Use Topics  . Smoking status: Current Some Day Smoker    Packs/day: 0.50  . Smokeless tobacco: Never Used  . Alcohol use No     Comment: June 15th     Allergies   Morphine; Penicillins; Amoxicillin; and  Meperidine   Review of Systems Review of Systems  Constitutional: Negative for chills and fever.  HENT: Negative for congestion, rhinorrhea and sore throat.   Eyes: Negative for visual disturbance.  Respiratory: Negative for cough, chest tightness and shortness of breath.   Cardiovascular: Negative for chest pain.  Gastrointestinal: Negative for abdominal pain, diarrhea, nausea and vomiting.  Genitourinary: Negative for dysuria.  Musculoskeletal: Negative for arthralgias and myalgias.  Skin: Negative for wound.  Neurological: Positive for dizziness. Negative for weakness and numbness.     Physical Exam Updated Vital Signs BP 114/78   Pulse 78   Temp 98 F (36.7 C) (Oral)   Resp 16   Wt 54.4 kg (120 lb)   SpO2 100%   BMI 18.79 kg/m   Physical Exam  Constitutional: No distress.  HENT:  Head: Normocephalic and atraumatic.  Oropharynx dry  Eyes: Pupils are equal, round, and reactive to light. EOM are normal. Right eye exhibits no discharge. Left eye exhibits no discharge.  Neck: Neck supple.  Cardiovascular: Normal rate, regular rhythm, normal heart sounds and intact distal pulses.   Pulmonary/Chest: Effort normal and breath sounds normal. No respiratory distress.  He has no wheezes. He has no rales.  Abdominal: Soft. Bowel sounds are normal. He exhibits no distension and no mass. There is no tenderness. There is no guarding.  Musculoskeletal: He exhibits no edema or deformity.  Neurological: He is alert. Coordination normal.  Speech is clear, able to follow commands, patient a bit sleepy on initial evaluation but answers questions appropriately CN III-XII intact Normal strength in upper and lower extremities bilaterally including dorsiflexion and plantar flexion, strong and equal grip strength Sensation normal to light and sharp touch Moves extremities without ataxia, coordination intact  Skin: Skin is warm and dry. Capillary refill takes less than 2 seconds. No rash noted.  He is not diaphoretic. No erythema.  Psychiatric: He has a normal mood and affect. His behavior is normal.  Nursing note and vitals reviewed.    ED Treatments / Results  Labs (all labs ordered are listed, but only abnormal results are displayed) Labs Reviewed  CBC - Abnormal; Notable for the following:       Result Value   Platelets 107 (*)    All other components within normal limits  URINALYSIS, ROUTINE W REFLEX MICROSCOPIC - Abnormal; Notable for the following:    Color, Urine STRAW (*)    Glucose, UA >=500 (*)    Hgb urine dipstick SMALL (*)    Ketones, ur 5 (*)    All other components within normal limits  ETHANOL - Abnormal; Notable for the following:    Alcohol, Ethyl (B) 346 (*)    All other components within normal limits  ACETAMINOPHEN LEVEL - Abnormal; Notable for the following:    Acetaminophen (Tylenol), Serum <10 (*)    All other components within normal limits  COMPREHENSIVE METABOLIC PANEL - Abnormal; Notable for the following:    Sodium 133 (*)    Potassium 3.0 (*)    Chloride 79 (*)    Glucose, Bld 788 (*)    BUN 5 (*)    AST 82 (*)    ALT 64 (*)    Alkaline Phosphatase 164 (*)    Total Bilirubin 1.3 (*)    Anion gap 23 (*)    All other components within normal limits  BLOOD GAS, ARTERIAL - Abnormal; Notable for the following:    pO2, Arterial 80.5 (*)    Acid-Base Excess 2.8 (*)    All other components within normal limits  CBG MONITORING, ED - Abnormal; Notable for the following:    Glucose-Capillary 552 (*)    All other components within normal limits  CBG MONITORING, ED - Abnormal; Notable for the following:    Glucose-Capillary 561 (*)    All other components within normal limits  CBG MONITORING, ED - Abnormal; Notable for the following:    Glucose-Capillary 464 (*)    All other components within normal limits  CBG MONITORING, ED - Abnormal; Notable for the following:    Glucose-Capillary 413 (*)    All other components within normal limits    GASTROINTESTINAL PANEL BY PCR, STOOL (REPLACES STOOL CULTURE)  SALICYLATE LEVEL    EKG  EKG Interpretation  Date/Time:  Tuesday January 19 2017 11:19:21 EDT Ventricular Rate:  91 PR Interval:  136 QRS Duration: 82 QT Interval:  408 QTC Calculation: 501 R Axis:   -5 Text Interpretation:  Normal sinus rhythm T wave abnormality, consider lateral ischemia Abnormal ECG no change from previous Confirmed by Arby BarrettePfeiffer, Marcy [phone]([id number]) on 01/19/2017 2:45:28 PM       Radiology Dg Chest 2  View  Result Date: 01/19/2017 CLINICAL DATA:  Weakness and dizziness. EXAM: CHEST  2 VIEW COMPARISON:  Chest x-ray 05/27/2016. FINDINGS: Mediastinum hilar structures normal. Cardiomegaly with normal pulmonary vascularity. Mild right base subsegmental atelectasis. No pleural effusion or pneumothorax. Stable elevation right hemidiaphragm. IMPRESSION: Mild right base subsegmental atelectasis. Stable elevation right hemidiaphragm. Electronically Signed   By: Maisie Fus  Register   On: 01/19/2017 15:42   Ct Head Wo Contrast  Result Date: 01/19/2017 CLINICAL DATA:  Dizziness and fall. EXAM: CT HEAD WITHOUT CONTRAST TECHNIQUE: Contiguous axial images were obtained from the base of the skull through the vertex without intravenous contrast. COMPARISON:  CT scan of Aug 27, 2016. FINDINGS: Brain: No evidence of acute infarction, hemorrhage, hydrocephalus, extra-axial collection or mass lesion/mass effect. Vascular: No hyperdense vessel or unexpected calcification. Skull: Normal. Negative for fracture or focal lesion. Sinuses/Orbits: No acute finding. Other: None. IMPRESSION: Normal head CT. Electronically Signed   By: Lupita Raider, M.D.   On: 01/19/2017 14:02    Procedures Procedures (including critical care time)  Medications Ordered in ED Medications  insulin regular (NOVOLIN R,HUMULIN R) 100 Units in sodium chloride 0.9 % 100 mL (1 Units/mL) infusion (5 Units/hr Intravenous New Bag/Given 01/19/17 1455)  sodium  chloride 0.9 % bolus 1,000 mL (0 mLs Intravenous Stopped 01/19/17 1551)    And  0.9 %  sodium chloride infusion ( Intravenous New Bag/Given 01/19/17 1454)  dextrose 5 %-0.45 % sodium chloride infusion (not administered)  potassium chloride 10 mEq in 100 mL IVPB (10 mEq Intravenous New Bag/Given 01/19/17 1551)  sodium chloride 0.9 % bolus 1,000 mL (0 mLs Intravenous Stopped 01/19/17 1333)     Initial Impression / Assessment and Plan / ED Course  I have reviewed the triage vital signs and the nursing notes.  Pertinent labs & imaging results that were available during my care of the patient were reviewed by me and considered in my medical decision making (see chart for details).  Instructed to go to the ED by PCP due to hypokalemia and hyperglycemia.  Patient complaining of some dizziness, reports he fell in the parking lot of his apartment, and hit his head, denies any other injury from this fall.   Patient is diabetic, with poor compliance.  Patient denies alcohol use, however level is significantly elevated. On initial evaluation vitals are normal, and patient is in no acute distress.   Glucose is 788 on arrival with an anion gap of 23, potassium is 3 and sodium is 133, kidney function normal. Ketones present in urine, no evidence.  No leukocytosis.  Alcohol level elevated to 346.  Similar to 3 weeks ago.  Liver enzymes similar to 3 weeks ago. Patient is in DKA, will initiate ED DKA protocol. Will get EKG and chest x-ray to look for anything that may have push patient into DKA other than medication noncompliance.  Given that patient endorses some confusion, with head trauma earlier today will get head CT to rule out any intracranial pathology clotting picture.  EKG unremarkable when compared to previous, chest x-ray shows no evidence of infiltrate.  Head CT shows no acute intracranial abnormalities.   Patient has had multiple loose bowel movements in the ED, continues to deny abdominal pain,  benign exam, patient denies recent antibiotic use.  Will order stool studies and collect sample if patient has another bowel movement.  Glucose slowly improving with fluids.  Patient will need to be admitted for continued treatment until anion gap closes, [facility] hospitalist consulted, spoke  with Dr. Margo Aye who will see and admit the patient.  Patient discussed with Dr. Broadus John, who saw patient as well and agrees with plan.    Final Clinical Impressions(s) / ED Diagnoses   Final diagnoses:  Diabetic ketoacidosis without coma associated with other specified diabetes mellitus (HCC)  Hypokalemia  Diarrhea, unspecified type    New Prescriptions New Prescriptions   No medications on file     Legrand Rams 01/19/17 1801    Arby Barrette, MD 01/23/17 [phone]

## 2017-01-19 NOTE — Progress Notes (Signed)
Pt presents for cbg check. Does not have Humalog with him to administer. CBG "HI" (>600). Reports he has not eaten breakfast this am. Did not take Humalog all weekend, only Lantus. Has not taken any medication today. Has not started Jardiance as recommended by NP. Reports he "did nothing" this weekend, has not even picked up new medication.  Pt reports he saw PCP yesterday and they drew new labs. Was called at 8pm last night and instructed to proceed to ED for critically low K+ and Mag. Pt reports he did not because he "needed to come in to work and make sure he still had a job." Reported to pt that I agree with his MD's recommendations and he needs to proceed to the ED for treatment as he will quickly progress to DKA. Pt agreeable to this. Reports he has already informed his supervisor that he would likely leave today after speaking with me. Sts he has already spoke with HR since he was concerned about his job status. RN made HR rep Lanora ManisElizabeth aware at pt's request of him proceeding to ED. Pt left clinic at 8:40 with reported intention of proceeding to ED directly.

## 2017-01-19 NOTE — ED Provider Notes (Signed)
Medical screening examination/treatment/procedure(s) were conducted as a shared visit with non-physician practitioner(s) and myself.  I personally evaluated the patient during the encounter.   EKG Interpretation  Date/Time:  Tuesday January 19 2017 11:19:21 EDT Ventricular Rate:  91 PR Interval:  136 QRS Duration: 82 QT Interval:  408 QTC Calculation: 501 R Axis:   -5 Text Interpretation:  Normal sinus rhythm T wave abnormality, consider lateral ischemia Abnormal ECG no change from previous Confirmed by Arby BarrettePfeiffer, Zeke Aker [phone]([id number]) on 01/19/2017 2:45:28 PM     Patient reports generalized dizziness and weakness for 2 weeks.  He denies chest pain or shortness of breath.  He reports he became so weak walking to the emergency department that he fell.  He denies associated pain from injury.  Patient is diabetic.  He is reporting compliance with medication.  Patient did initially deny alcohol consumption however alcohol level is significantly elevated.  Patient reports that his physician called him and instructed him to come to the emergency department because of low potassium. Patient is alert and appropriate.  He smells strongly of ketones.  No respiratory distress.  Heart regular.  Lungs grossly clear.  Abdomen soft without guarding.  No peripheral edema or extremity injuries.  Mental status is clear.  Patient follows commands without difficulty. I agree with plan and management.   Arby BarrettePfeiffer, Kaleeyah Cuffie, MD 01/19/17 1452

## 2017-01-19 NOTE — ED Notes (Signed)
Patient transported to CT 

## 2017-01-19 NOTE — ED Triage Notes (Addendum)
Pt reports his dr from [facility] called him last night and instructed him to come to ER d/t K of 2.5. Pt endorses generalized weakness and dizziness x 2 weeks. Denies chest pain or palpations. Pt states he became dizzy this morning while walking into ED and fell in parking lot. No apparent injury

## 2017-01-19 NOTE — ED Notes (Signed)
Attempted report x1. 

## 2017-01-19 NOTE — ED Notes (Signed)
Help get patient on dress into a gown on the monitor

## 2017-01-19 NOTE — ED Notes (Signed)
Pt's CBG result was 413. Informed Cindy - RN.

## 2017-01-19 NOTE — ED Notes (Signed)
Dr. Corlis LeakMackuen in triage to see pt
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
## 2017-01-20 DIAGNOSIS — E43 Unspecified severe protein-calorie malnutrition: Secondary | ICD-10-CM | POA: Insufficient documentation

## 2017-01-20 DIAGNOSIS — L8990 Pressure ulcer of unspecified site, unspecified stage: Secondary | ICD-10-CM | POA: Insufficient documentation

## 2017-01-20 DIAGNOSIS — E1310 Other specified diabetes mellitus with ketoacidosis without coma: Secondary | ICD-10-CM

## 2017-01-20 LAB — CBC WITH DIFFERENTIAL/PLATELET
BASOS ABS: 0 10*3/uL (ref 0.0–0.1)
Basophils Relative: 0 %
EOS PCT: 1 %
Eosinophils Absolute: 0 10*3/uL (ref 0.0–0.7)
HEMATOCRIT: 36.2 % — AB (ref 39.0–52.0)
Hemoglobin: 13.1 g/dL (ref 13.0–17.0)
LYMPHS PCT: 30 %
Lymphs Abs: 1.4 10*3/uL (ref 0.7–4.0)
MCH: 30.9 pg (ref 26.0–34.0)
MCHC: 36.2 g/dL — ABNORMAL HIGH (ref 30.0–36.0)
MCV: 85.4 fL (ref 78.0–100.0)
Monocytes Absolute: 0.5 10*3/uL (ref 0.1–1.0)
Monocytes Relative: 11 %
NEUTROS ABS: 2.8 10*3/uL (ref 1.7–7.7)
NEUTROS PCT: 58 %
PLATELETS: 79 10*3/uL — AB (ref 150–400)
RBC: 4.24 MIL/uL (ref 4.22–5.81)
RDW: 14.1 % (ref 11.5–15.5)
WBC: 4.8 10*3/uL (ref 4.0–10.5)

## 2017-01-20 LAB — BASIC METABOLIC PANEL
ANION GAP: 10 (ref 5–15)
Anion gap: 13 (ref 5–15)
Anion gap: 8 (ref 5–15)
BUN: <5 mg/dL — ABNORMAL LOW (ref 6–20)
CHLORIDE: 93 mmol/L — AB (ref 101–111)
CO2: 31 mmol/L (ref 22–32)
CO2: 32 mmol/L (ref 22–32)
CO2: 31 mmol/L (ref 22–32)
Calcium: 7.5 mg/dL — ABNORMAL LOW (ref 8.9–10.3)
Calcium: 7.6 mg/dL — ABNORMAL LOW (ref 8.9–10.3)
Calcium: 7.4 mg/dL — ABNORMAL LOW (ref 8.9–10.3)
Chloride: 93 mmol/L — ABNORMAL LOW (ref 101–111)
Chloride: 95 mmol/L — ABNORMAL LOW (ref 101–111)
Creatinine, Ser: 0.43 mg/dL — ABNORMAL LOW (ref 0.61–1.24)
Creatinine, Ser: 0.46 mg/dL — ABNORMAL LOW (ref 0.61–1.24)
Creatinine, Ser: 0.53 mg/dL — ABNORMAL LOW (ref 0.61–1.24)
GFR calc Af Amer: >60 mL/min (ref >60)
GFR calc Af Amer: >60 mL/min (ref >60)
GFR calc non Af Amer: >60 mL/min (ref >60)
GLUCOSE: 202 mg/dL — AB (ref 65–99)
Glucose, Bld: 96 mg/dL (ref 65–99)
Glucose, Bld: 99 mg/dL (ref 65–99)
POTASSIUM: 3.4 mmol/L — AB (ref 3.5–5.1)
POTASSIUM: 2.3 mmol/L — AB (ref 3.5–5.1)
POTASSIUM: 2.4 mmol/L — AB (ref 3.5–5.1)
SODIUM: 138 mmol/L (ref 135–145)
Sodium: 134 mmol/L — ABNORMAL LOW (ref 135–145)
Sodium: 134 mmol/L — ABNORMAL LOW (ref 135–145)

## 2017-01-20 LAB — GASTROINTESTINAL PANEL BY PCR, STOOL (REPLACES STOOL CULTURE)

## 2017-01-20 LAB — GLUCOSE, CAPILLARY
GLUCOSE-CAPILLARY: 108 mg/dL — AB (ref 65–99)
GLUCOSE-CAPILLARY: 113 mg/dL — AB (ref 65–99)
GLUCOSE-CAPILLARY: 117 mg/dL — AB (ref 65–99)
GLUCOSE-CAPILLARY: 132 mg/dL — AB (ref 65–99)
GLUCOSE-CAPILLARY: 134 mg/dL — AB (ref 65–99)
GLUCOSE-CAPILLARY: 201 mg/dL — AB (ref 65–99)
GLUCOSE-CAPILLARY: 232 mg/dL — AB (ref 65–99)
Glucose-Capillary: 158 mg/dL — ABNORMAL HIGH (ref 65–99)
Glucose-Capillary: 101 mg/dL — ABNORMAL HIGH (ref 65–99)
Glucose-Capillary: 108 mg/dL — ABNORMAL HIGH (ref 65–99)
Glucose-Capillary: 79 mg/dL (ref 65–99)
Glucose-Capillary: 50 mg/dL — ABNORMAL LOW (ref 65–99)

## 2017-01-20 LAB — MAGNESIUM
MAGNESIUM: 1.9 mg/dL (ref 1.7–2.4)
MAGNESIUM: 1.5 mg/dL — AB (ref 1.7–2.4)
MAGNESIUM: 1 mg/dL — AB (ref 1.7–2.4)

## 2017-01-20 MED ORDER — PROMETHAZINE HCL 25 MG/ML IJ SOLN
12.5000 mg | Freq: Four times a day (QID) | INTRAMUSCULAR | Status: DC | PRN
  Administered 2017-01-20: 12.5 mg via INTRAVENOUS
  Filled 2017-01-20: qty 1

## 2017-01-20 MED ORDER — ALBUTEROL SULFATE (2.5 MG/3ML) 0.083% IN NEBU
INHALATION_SOLUTION | RESPIRATORY_TRACT | Status: AC
  Filled 2017-01-20: qty 3

## 2017-01-20 MED ORDER — INSULIN GLARGINE 100 UNIT/ML ~~LOC~~ SOLN
25.0000 [IU] | Freq: Every day | SUBCUTANEOUS | Status: DC
  Administered 2017-01-21: 25 [IU] via SUBCUTANEOUS
  Filled 2017-01-20: qty 0.25

## 2017-01-20 MED ORDER — ONDANSETRON HCL 4 MG/2ML IJ SOLN: 4.0000 mg | Freq: Four times a day (QID) | INTRAMUSCULAR | Status: DC | PRN

## 2017-01-20 MED ORDER — INSULIN ASPART 100 UNIT/ML ~~LOC~~ SOLN
3.0000 [IU] | Freq: Three times a day (TID) | SUBCUTANEOUS | Status: DC
  Administered 2017-01-20 – 2017-01-21 (×2): 3 [IU] via SUBCUTANEOUS

## 2017-01-20 MED ORDER — POTASSIUM CHLORIDE CRYS ER 20 MEQ PO TBCR
40.0000 meq | EXTENDED_RELEASE_TABLET | Freq: Once | ORAL | Status: AC
  Administered 2017-01-20: 40 meq via ORAL
  Filled 2017-01-20: qty 2

## 2017-01-20 MED ORDER — MAGNESIUM SULFATE 2 GM/50ML IV SOLN
2.0000 g | Freq: Once | INTRAVENOUS | Status: AC
  Administered 2017-01-20: 2 g via INTRAVENOUS
  Filled 2017-01-20: qty 50

## 2017-01-20 MED ORDER — INSULIN ASPART 100 UNIT/ML ~~LOC~~ SOLN
0.0000 [IU] | Freq: Every day | SUBCUTANEOUS | Status: DC
  Administered 2017-01-20: 2 [IU] via SUBCUTANEOUS

## 2017-01-20 MED ORDER — POTASSIUM CHLORIDE 10 MEQ/100ML IV SOLN
10.0000 meq | INTRAVENOUS | Status: AC
  Administered 2017-01-20 (×4): 10 meq via INTRAVENOUS
  Filled 2017-01-20: qty 100

## 2017-01-20 MED ORDER — GLUCERNA SHAKE PO LIQD
237.0000 mL | Freq: Three times a day (TID) | ORAL | Status: DC
  Administered 2017-01-20 – 2017-01-21 (×3): 237 mL via ORAL

## 2017-01-20 MED ORDER — INSULIN GLARGINE 100 UNIT/ML ~~LOC~~ SOLN
25.0000 [IU] | Freq: Every day | SUBCUTANEOUS | Status: DC
  Filled 2017-01-20: qty 0.25

## 2017-01-20 MED ORDER — ADULT MULTIVITAMIN W/MINERALS CH
1.0000 | ORAL_TABLET | Freq: Every day | ORAL | Status: DC
  Administered 2017-01-20 – 2017-01-21 (×2): 1 via ORAL
  Filled 2017-01-20 (×2): qty 1

## 2017-01-20 MED ORDER — INSULIN GLARGINE 100 UNIT/ML ~~LOC~~ SOLN
30.0000 [IU] | Freq: Two times a day (BID) | SUBCUTANEOUS | Status: DC
  Administered 2017-01-20 (×2): 30 [IU] via SUBCUTANEOUS
  Filled 2017-01-20 (×3): qty 0.3

## 2017-01-20 MED ORDER — INSULIN ASPART 100 UNIT/ML ~~LOC~~ SOLN
0.0000 [IU] | Freq: Three times a day (TID) | SUBCUTANEOUS | Status: DC
  Administered 2017-01-20 – 2017-01-21 (×2): 3 [IU] via SUBCUTANEOUS

## 2017-01-20 MED ORDER — INSULIN ASPART 100 UNIT/ML ~~LOC~~ SOLN
0.0000 [IU] | Freq: Three times a day (TID) | SUBCUTANEOUS | Status: DC
  Administered 2017-01-20: 1 [IU] via SUBCUTANEOUS
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
## 2017-01-20 NOTE — Progress Notes (Signed)
Pt was diaphoretic, checked CBG: 50, provided lunch tray and milk with crackers. Rechecked CBG was 108. Diabetic educator talked to pt.  Pt was transferred to 2W and gave a report to RN, Pt need to receive 2 more Kcl 10meq IVPB. Pt took his belongings ( black bag, cell phone). HS McDonald's CorporationLee RN

## 2017-01-20 NOTE — Progress Notes (Addendum)
Inpatient Diabetes Program Recommendations  AACE/ADA: New Consensus Statement on Inpatient Glycemic Control (2015)  Target Ranges:  Prepandial:   less than 140 mg/dL      Peak postprandial:   less than 180 mg/dL (1-2 hours)      Critically ill patients:  140 - 180 mg/dL   Lab Results  Component Value Date   GLUCAP 50 (L) 01/20/2017   HGBA1C 12.6 (H) 12/25/2016    Review of Glycemic ControlResults for Terry Schultz, Jairus F (MRN [id number]) as of 01/20/2017 15:17  Ref. Range 01/20/2017 03:10 01/20/2017 04:18 01/20/2017 08:22 01/20/2017 12:15 01/20/2017 14:47  Glucose-Capillary Latest Ref Range: 65 - 99 mg/dL 409117 (H) 811134 (H) 79 914132 (H) 50 (L)    Diabetes history: DM-uncontrolled, pancreatitis Outpatient Diabetes medications: Lantus 30 units bid, Humalog 0-10 units tid with meals, Jardiance 10 mg daily Current orders for Inpatient glycemic control:  Lantus 30 units bid, Novolog sensitive tid with meals and HS  Inpatient Diabetes Program Recommendations:    Please consider reducing Lantus to 25 units daily.  Also consider adding Novolog meal coverage 3 units tid with meals. Note that patient also had readmission from 11/2016.  Also note that patient has close contact with NP at job to help him with management of DM.  He states that he does not have a meter (he lost it).  He also states that he is not giving Novolog injections due to "running out of space to give injections".  We discussed site selection and that insulin should be injected into fat.  Unclear if patient is "afraid of injections"? We did discuss monitoring.  Patient states he has lost meter.  Discussed Freestyle Libre sensor which does not require fingersticks.  Discussed briefly potential to be enrolled in research study using [employer].  Patient states he is interested in study and sensor stating he feels this will help him.  We discussed the difference between basal/bolus injections.  He may benefit from V-Go insulin delivery  system as well which would allow him to place once daily?  Consider endocrinology consult at d/c to assist with DM management due to uncontrolled diabetes and difficulty patient is having giving injections, etc.   Will discuss with MD.    Thanks Beryl MeagerJenny Laurieann Friddle, RN, BC-ADM Inpatient Diabetes Coordinator Pager [phone] (8a-5p)

## 2017-01-20 NOTE — Progress Notes (Signed)
Initial Nutrition Assessment  DOCUMENTATION CODES:   Severe malnutrition in context of acute illness/injury  INTERVENTION:   -Glucerna Shake po TID, each supplement provides 220 kcal and 10 grams of protein -MVI daily  NUTRITION DIAGNOSIS:   Malnutrition (Severe) related to acute illness (DKA) as evidenced by percent weight loss, mild depletion of muscle mass, moderate depletions of muscle mass.  GOAL:   Patient will meet greater than or equal to 90% of their needs  MONITOR:   PO intake, Supplement acceptance, Labs, Weight trends, Skin, I & O's  REASON FOR ASSESSMENT:   Malnutrition Screening Tool    ASSESSMENT:   Terry Schultz is a 46 y.o. male with medical history significant for uncontrolled diabetes, diabetic polyneuropathy, unspecified, medical non compliance, depression/anxiety, alcohol abuse, pancreatitis, who [facility] per his PCP recommendation to be evaluated for hypokalemia with self reported K+ 2.3 and hyperglycemia. Patient was recently admitted less than 2 weeks ago for DKA.  Pt admitted with DKA.   Spoke with pt at bedside, who reports not feeling well and with a flat affect. Pt shares that he has had an ongoing poor appetite for at least the past 3 weeks. Pt consumes 2 meals per day- Breakfast: grits and eggs, Dinner: frozen dinner. Beverages include mainly water and diet coke. He consumed about 50-75% of breakfast- oatmeal, grits, and eggs.  Pt reports UBW is around 145#. He reports he has lost weight since last hospitalization, but unsure how much. Noted pt has experienced a 13.4% wt loss over the past 6 weeks. Suspect some wt loss may be related to uncontrolled DM.   Nutrition-Focused physical exam completed. Findings are no fat depletion, mild to moderate muscle depletion, and no edema.   Last A1c: 12.6 (12/25/16). Home DM medications include 10 mg empaglifozin daily, 10 mg glipizide daily, 30 units insulin glargine BUD, 0-10  units insulin lispro TID.   Labs reviewed: K: 2.4 (on IV supplementation), CBGS: 101-134 (inaptient orders for glycemic control on 0-5 units insulin aspart q HS, 0-9 units insulin aspart TID with meals, and 30 units insulin glargine BID).   Diet Order:  Diet Carb Modified Fluid consistency: Thin; Room service appropriate? Yes  Skin:  Wound (see comment) (stage 1 sacrum)  Last BM:  01/20/17  Height:   Ht Readings from Last 1 Encounters:  01/19/17 5\' 7"  (1.702 m)    Weight:   Wt Readings from Last 1 Encounters:  01/19/17 122 lb 9.2 oz (55.6 kg)    Ideal Body Weight:  67.3 kg  BMI:  Body mass index is 19.2 kg/m.  Estimated Nutritional Needs:   Kcal:  1750-1950  Protein:  100-115 grams  Fluid:  1.7-1.9 L  EDUCATION NEEDS:   Education needs no appropriate at this time  Marcha Licklider A. Mayford KnifeWilliams, RD, LDN, CDE Pager: [phone] After hours Pager: [phone]

## 2017-01-20 NOTE — Progress Notes (Addendum)
Dr. Toniann FailKakrakandy notified of cbg 113 with normal anion gap of 13 on previous BMET.  MD states to get next BMET at 0100 to evaluate anion gap and potassium.  Pt remains on glucostabilizer at this time pending results of next BMET.  Pt educated to notify staff of symptoms of hypoglycemia.  Pt. Verbalizes understanding. Pt w/ complaints of increasing numbness and tingling to to bilateral arms and legs.  Dr. Toniann FailKakrakandy made aware.  Lab notified of requested next draw for BMET.

## 2017-01-20 NOTE — Progress Notes (Signed)
CRITICAL VALUE ALERT  Critical Value:  Potassium 2.3  Date & Time Notied:  10/24 0151  Provider Notified: Dr. Toniann FailKakrakandy  Orders Received/Actions taken: orders received for magnesium IV and recheck BMP at 0600.

## 2017-01-20 NOTE — Progress Notes (Signed)
CRITICAL VALUE ALERT  Critical Value: potassium 2.0  Date & Time Notied:  01/19/17 2245  Provider Notified: Dr. Toniann FailKakrakandy  Orders Received/Actions taken: Orders received for IV and PO potassium

## 2017-01-20 NOTE — Progress Notes (Signed)
Potassium running @ 50 ml/hr due to patient complained pain on PIV site and delayed the scheduled potassium and magnesium. There is order for 1300 lab work. Sent a note to Dr. Alvino Chapelhoi regarding this and changed time of lab work  to 1800 after estimated time of finishing potassium infusion. HS McDonald's CorporationLee RN

## 2017-01-21 DIAGNOSIS — R197 Diarrhea, unspecified: Secondary | ICD-10-CM

## 2017-01-21 DIAGNOSIS — E1110 Type 2 diabetes mellitus with ketoacidosis without coma: Principal | ICD-10-CM

## 2017-01-21 DIAGNOSIS — E876 Hypokalemia: Secondary | ICD-10-CM

## 2017-01-21 LAB — MAGNESIUM
MAGNESIUM: 1.6 mg/dL — AB (ref 1.7–2.4)
MAGNESIUM: 2 mg/dL (ref 1.7–2.4)

## 2017-01-21 LAB — BASIC METABOLIC PANEL
ANION GAP: 10 (ref 5–15)
ANION GAP: 7 (ref 5–15)
BUN: <5 mg/dL — ABNORMAL LOW (ref 6–20)
BUN: <5 mg/dL — ABNORMAL LOW (ref 6–20)
CALCIUM: 8.3 mg/dL — AB (ref 8.9–10.3)
CHLORIDE: 98 mmol/L — AB (ref 101–111)
CO2: 29 mmol/L (ref 22–32)
CO2: 31 mmol/L (ref 22–32)
CREATININE: 0.59 mg/dL — AB (ref 0.61–1.24)
Calcium: 7.9 mg/dL — ABNORMAL LOW (ref 8.9–10.3)
Chloride: 97 mmol/L — ABNORMAL LOW (ref 101–111)
Creatinine, Ser: 0.42 mg/dL — ABNORMAL LOW (ref 0.61–1.24)
GFR calc Af Amer: >60 mL/min (ref >60)
Glucose, Bld: 77 mg/dL (ref 65–99)
Glucose, Bld: 254 mg/dL — ABNORMAL HIGH (ref 65–99)
POTASSIUM: 2.5 mmol/L — AB (ref 3.5–5.1)
Potassium: 3.2 mmol/L — ABNORMAL LOW (ref 3.5–5.1)
SODIUM: 137 mmol/L (ref 135–145)
SODIUM: 135 mmol/L (ref 135–145)

## 2017-01-21 LAB — GLUCOSE, CAPILLARY
GLUCOSE-CAPILLARY: 64 mg/dL — AB (ref 65–99)
GLUCOSE-CAPILLARY: 218 mg/dL — AB (ref 65–99)

## 2017-01-21 MED ORDER — INSULIN GLARGINE 100 UNIT/ML SOLOSTAR PEN: 22.0000 [IU] | PEN_INJECTOR | Freq: Every day | SUBCUTANEOUS | 0 refills | Status: AC

## 2017-01-21 MED ORDER — GLUCOSE BLOOD VI STRP: 1.0000 | ORAL_STRIP | Freq: Four times a day (QID) | 0 refills | Status: AC

## 2017-01-21 MED ORDER — INSULIN LISPRO 100 UNIT/ML ~~LOC~~ SOLN: 0.0000 [IU] | Freq: Three times a day (TID) | SUBCUTANEOUS | 0 refills | Status: AC

## 2017-01-21 MED ORDER — POTASSIUM CHLORIDE 10 MEQ/100ML IV SOLN
10.0000 meq | INTRAVENOUS | Status: AC
  Administered 2017-01-21 (×2): 10 meq via INTRAVENOUS
  Filled 2017-01-21 (×2): qty 100

## 2017-01-21 MED ORDER — CONTINUOUS BLOOD GLUC SENSOR MISC: 1.0000 | 0 refills | Status: DC

## 2017-01-21 MED ORDER — POTASSIUM CHLORIDE CRYS ER 20 MEQ PO TBCR
40.0000 meq | EXTENDED_RELEASE_TABLET | Freq: Once | ORAL | Status: AC
Start: 2017-01-21 — End: 2017-01-21
  Administered 2017-01-21: 40 meq via ORAL
  Filled 2017-01-21: qty 2

## 2017-01-21 MED ORDER — POTASSIUM CHLORIDE CRYS ER 20 MEQ PO TBCR
60.0000 meq | EXTENDED_RELEASE_TABLET | Freq: Once | ORAL | Status: AC
  Administered 2017-01-21: 60 meq via ORAL
  Filled 2017-01-21: qty 3

## 2017-01-21 MED ORDER — POTASSIUM CHLORIDE CRYS ER 20 MEQ PO TBCR: 40.0000 meq | EXTENDED_RELEASE_TABLET | Freq: Once | ORAL | 0 refills | Status: DC

## 2017-01-21 MED ORDER — BLOOD GLUCOSE METER KIT: PACK | 0 refills | Status: AC

## 2017-01-21 MED ORDER — LOPERAMIDE HCL 2 MG PO CAPS: 2.0000 mg | ORAL_CAPSULE | ORAL | 0 refills | Status: AC | PRN

## 2017-01-21 MED ORDER — LOPERAMIDE HCL 2 MG PO CAPS: 2.0000 mg | ORAL_CAPSULE | ORAL | Status: DC | PRN

## 2017-01-21 MED ORDER — INSULIN PEN NEEDLE 31G X 5 MM MISC: 0 refills | Status: AC

## 2017-01-21 MED ORDER — BAYER MICROLET LANCETS MISC: 1.0000 | Freq: Four times a day (QID) | 0 refills | Status: AC

## 2017-01-21 MED ORDER — MAGNESIUM SULFATE 2 GM/50ML IV SOLN
2.0000 g | Freq: Once | INTRAVENOUS | Status: AC
  Administered 2017-01-21: 2 g via INTRAVENOUS
  Filled 2017-01-21: qty 50

## 2017-01-21 MED ORDER — "INSULIN SYRINGE-NEEDLE U-100 30G X 1/2"" 0.3 ML MISC": 1.0000 | Freq: Three times a day (TID) | 0 refills | Status: AC | PRN

## 2017-01-21 NOTE — Progress Notes (Signed)
PT Cancellation Note  Patient Details Name: Terry NeedyRobert F Balow MRN: [id number] DOB: 02-20-1971   Cancelled Treatment:    Reason Eval/Treat Not Completed: Medical issues which prohibited therapy.  Critical K+ and will check later as pt allows.   Ivar DrapeRuth E Jceon Alverio 01/21/2017, 11:53 AM   Samul Dadauth Enijah Furr, PT MS Acute Rehab Dept. Number: [facility] [phone] and [facility] [phone]

## 2017-01-21 NOTE — Progress Notes (Signed)
Critical result:  Notified MD of potassium of 2.5

## 2017-01-21 NOTE — Progress Notes (Addendum)
Inpatient Diabetes Program Recommendations  AACE/ADA: New Consensus Statement on Inpatient Glycemic Control (2015)  Target Ranges:  Prepandial:   less than 140 mg/dL      Peak postprandial:   less than 180 mg/dL (1-2 hours)      Critically ill patients:  140 - 180 mg/dL   Lab Results  Component Value Date   GLUCAP 64 (L) 01/21/2017   HGBA1C 12.6 (H) 12/25/2016    Review of Glycemic ControlResults for Terry Schultz, Negan F (MRN [id number]) as of 01/21/2017 11:50  Ref. Range 01/20/2017 14:47 01/20/2017 15:20 01/20/2017 17:29 01/20/2017 22:00 01/21/2017 08:42  Glucose-Capillary Latest Ref Range: 65 - 99 mg/dL 50 (L) 409108 (H) 811201 (H) 232 (H) 64 (L)  Diabetes history: DM-uncontrolled, pancreatitis Outpatient Diabetes medications: Lantus 30 units bid, Humalog 0-10 units tid with meals, Jardiance 10 mg daily Current orders for Inpatient glycemic control:  Lantus 25 units daily, Novolog sensitive tid with meals and HS, Novolog 3 units tid with meals  Inpatient Diabetes Program Recommendations:    Please consider reducing Lantus to 22 units daily.  Also would not resume Invokana at d/c due to history of DKA.  Patient has signed and been given copy of consent for CGM/Freestyle [place] research study. MD also notified.  Education done regarding application and changing CGM sensor (alternate every 10 days on back of arms), 12 hour warm-up, use of glucometer/where to buy strips, how to scan CGM for glucose reading and information for PCP. Patient has also been given educational packet regarding use CGM sensor including the 1-800 toll free number for any questions, problems or needs related to [facility] sensors or reader.  Patient to be given Rx. For sensors with prescriptions/discharge paper work.   Explained that glucose readings will not be available until 12 hours after application. Patient understands that Diabetes coordinator will call them 2 times after discharge (between days 7-12 after d/c from  hospital and between days 30-25 after d/c from hospital). Patient verbalizes understanding of use of Freestyle Libre CGM and was told that any issues with blood sugars/diabetes will need to be addressed by PCP.  Diabetes Quality of Life Survey administered to patient.   Awaiting discharge order prior to patient applying sensor and giving patient reader.   Thanks, Beryl MeagerJenny Kyser Wandel, RN, BC-ADM Inpatient Diabetes Coordinator Pager [phone])  1445:  Assisted patient with application of glucose sensor.  Told him again that it will be 12 hours before sensor is warmed up.  Patient applied sensor to right arm.  Reviewed instructions again with patient.  He verbalized understanding. Alerted RN that sensor is in place.

## 2017-01-21 NOTE — Discharge Instructions (Signed)
·   Check your blood sugar 4 times daily: before breakfast, before lunch, before dinner, before sleep. You may also check throughout the day if you feel that your blood sugar is too low or too high.   Lantus is your long-acting insulin. This should be used at the same time each day. Start with 22 units daily.   Novolog is your short-acting correction insulin. Check your blood sugar prior to your meal. Then depending on your blood sugar, inject insulin as follows: CBG 121 - 150: 1 units  CBG 151 - 200: 2 units  CBG 201 - 250: 3 units  CBG 251 - 300: 5 units  CBG 301 - 350: 7 units  CBG 351 - 400: 9 units   Keep a journal of your blood sugar results each day. Bring to your primary care physician.
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
## 2017-01-21 NOTE — Progress Notes (Signed)
Discharge instructions, medications/prescriptions discussed and reviewed with pt, verbalized understanding. Telemetry dc'ed, CCMD notified. IV site dc'ed, site clean and dry. Pt took his potassium tablet prior to dc. Pt was escorted out of the unit in wheelchair, took all belongings with him.

## 2017-01-25 ENCOUNTER — Ambulatory Visit: Payer: Self-pay | Admitting: *Deleted

## 2017-01-25 DIAGNOSIS — Z794 Long term (current) use of insulin: Principal | ICD-10-CM

## 2017-01-25 DIAGNOSIS — E118 Type 2 diabetes mellitus with unspecified complications: Secondary | ICD-10-CM

## 2017-01-25 NOTE — Progress Notes (Signed)
Per chart review, pt d/c from hospital 01/21/17 with work note out through 10/29. To return 10/30. Med changes made to home regimen at d/c.

## 2017-01-25 NOTE — Progress Notes (Signed)
Pt presents to clinic for insulin injection. He brings work note from hospital stating out of work through 10/29, which he read as returning today. HR sending him home as per work note since he is not released to work today. He says he has felt "crappy" all weekend and will just go home and sleep.  He informs RN of [employer] study he is now involved in. Pt reports he is happy to be involved in this and able to read his blood sugars easier. Diet has not changed. Still had sausage mcmuffin again for breakfast as he does daily. RN has spoken with pt multiple times regarding better diet choices to help control DM2. Pt does not seem willing to change dietary habits.   Checks CBG via Libre patch while in clinic. His meter reads 486. He reports his CBG has been in the 400s all weekend. Reports Lantus was decreased from 30 units to 22 units. Still on Humalog sliding scale. 10 units administered to L posterior upper arm per pt request.   Pt also reports fall he had while waiting for uber ride to hospital. Gilmer Morane fell off curb of sidewalk and he fell backwards, hitting head. Still sore in back and head. CT in ED negative.   Per chart review, pt needs to see PCP this week for hospital d/c f/u and lab recheck. He also reports needing a Rx for additional Libre patches. Instructed him that his PCP would need to manage this for the study per chart notes. He sts he will call today for an appt for this week.

## 2017-01-26 ENCOUNTER — Ambulatory Visit: Payer: Self-pay | Admitting: Registered Nurse

## 2017-01-26 VITALS — HR 65 | Resp 16

## 2017-01-26 DIAGNOSIS — E118 Type 2 diabetes mellitus with unspecified complications: Secondary | ICD-10-CM

## 2017-01-26 DIAGNOSIS — Z794 Long term (current) use of insulin: Principal | ICD-10-CM

## 2017-01-26 LAB — GLUCOSE, POCT (MANUAL RESULT ENTRY)

## 2017-01-26 NOTE — Telephone Encounter (Signed)
Patient came to clinic today for reassessment s/p hospitalization.  Reminded patient to schedule 1 week follow up with PCM and Diabetes Study nurse regarding his new blood sugar monitor freestyle libre.  Patient had patch on and meter/reader with him at work today and knowledgeable on use.  Due BMP and magnesium per hospitalist note 1 week post discharge.  Patient requested omeprazole 20mg  po daily #30 RF0 and potassium chloride 20meq po BID #60 RF0 dispensed from work formulary completed and given to patient today.

## 2017-01-26 NOTE — Progress Notes (Signed)
Subjective:    Patient ID: Terry Schultz, male    DOB: 1971/01/01, 46 y.o.   MRN: [id number]  46y/o divorced caucasian male established Pt presents for injection and med refills. CBG checked to correlate with new Libre freestyle meter. His patch is reading HI. He reports this to be >500 as his meter only reads up to 500. Clinic meter reading HI (>600). He reports cbg was under 500 this morning before work in 400s.  He is here because he needs his potassium chloride tabs po daily and omeprazole 20mg  po daily filled new from discharge.  He attempted to pick up refill of his insulin yesterday and pharmacy told him insurance card not working so he did not take medicine home.  He stated received call from pharmacy today and insurance covered his insulin so he will pick up today.  Patient still had enough insulin  On hand to administer this week.  Patient also asking about refills on his freestyle libre meter instructed him to discuss with nurse next week as his patch for glucose checks should last longer than a week.  Patient reported he has preferred this new way of checking his blood sugars fewer sticks and pain associated.  Discussed alternative injection sites with patient for his SQ insulin and he prefers stomach only for self-administration and arms if someone helping him as legs/thighs too painful.  He has noticed bruising on arms due to blood draws in hospital and s/p injections.  Feeling okay today his first day back at work.  He stopped his invokana, jardiance and glipizide oral per hospitalist instructions.  Patient reported diarrhea has improved less frequent since hospitalization.  Drinking sugar free gatorade today to help with his electrolytes.  Ate breakfast his usual.  His lunch break scheduled for 1pm today.      Review of Systems  Constitutional: Negative for activity change, appetite change, chills, diaphoresis, fatigue, fever and unexpected weight change.  HENT: Negative for  trouble swallowing and voice change.   Eyes: Negative for photophobia and visual disturbance.  Respiratory: Negative for cough, shortness of breath, wheezing and stridor.   Cardiovascular: Negative for chest pain and palpitations.  Gastrointestinal: Positive for diarrhea. Negative for abdominal distention, abdominal pain, blood in stool, constipation, nausea and vomiting.  Endocrine: Negative for cold intolerance and heat intolerance.  Genitourinary: Negative for difficulty urinating, dysuria and hematuria.  Musculoskeletal: Negative for neck pain and neck stiffness.  Skin: Positive for color change. Negative for pallor, rash and wound.  Allergic/Immunologic: Negative for environmental allergies and food allergies.  Neurological: Positive for numbness. Negative for dizziness, tremors, seizures, syncope, facial asymmetry, speech difficulty, weakness, light-headedness and headaches.  Psychiatric/Behavioral: Negative for agitation, confusion and sleep disturbance.       Objective:   Physical Exam  Constitutional: He is oriented to person, place, and time. Vital signs are normal. He appears well-developed and well-nourished. He is active and cooperative.  Non-toxic appearance. He does not have a sickly appearance. He does not appear ill. No distress.  Patient smells like aftershave cologne today  HENT:  Head: Normocephalic and atraumatic.  Right Ear: Hearing and external ear normal.  Left Ear: Hearing and external ear normal.  Nose: Nose normal.  Mouth/Throat: Uvula is midline, oropharynx is clear and moist and mucous membranes are normal. No oropharyngeal exudate.  Eyes: Pupils are equal, round, and reactive to light. Conjunctivae, EOM and lids are normal. Right eye exhibits no discharge. Left eye exhibits no discharge. No scleral  icterus.  Neck: Trachea normal, normal range of motion and phonation normal. Neck supple. No neck rigidity. No tracheal deviation, no edema, no erythema and normal  range of motion present.  Cardiovascular: Normal rate, regular rhythm, normal heart sounds and intact distal pulses.   Pulses:      Radial pulses are 2+ on the right side, and 2+ on the left side.  Pulmonary/Chest: Effort normal and breath sounds normal. No accessory muscle usage or stridor. No respiratory distress. He has no decreased breath sounds. He has no wheezes. He has no rhonchi. He has no rales. He exhibits no tenderness.  Abdominal: Soft. Normal appearance. He exhibits no distension, no fluid wave and no ascites. There is no rigidity and no guarding.  Musculoskeletal: Normal range of motion. He exhibits no edema, tenderness or deformity.       Right shoulder: Normal.       Left shoulder: Normal.       Right elbow: Normal.      Left elbow: Normal.       Right hip: Normal.       Left hip: Normal.       Right knee: Normal.       Left knee: Normal.       Cervical back: Normal.       Thoracic back: Normal.       Lumbar back: Normal.       Right hand: Normal.       Left hand: Normal.  Lymphadenopathy:    He has no cervical adenopathy.  Neurological: He is alert and oriented to person, place, and time. He has normal strength. He is not disoriented. He displays no tremor. He exhibits normal muscle tone. He displays no seizure activity. Coordination and gait normal. GCS eye subscore is 4. GCS verbal subscore is 5. GCS motor subscore is 6.  Gait slow and steady in hall; in/out of chair without difficulty  Skin: Skin is warm, dry and intact. Bruising noted. No abrasion, no burn, no laceration, no lesion, no petechiae and no rash noted. He is not diaphoretic. No cyanosis or erythema. No pallor. Nails show no clubbing.     Psychiatric: He has a normal mood and affect. His speech is normal and behavior is normal. Judgment and thought content normal. Cognition and memory are normal.  Vitals reviewed.    Administered 12u sq left upper arm insulin novalog U-100 per sliding scale >600 12  units at 0925.  Discussed with patient high blood sugar (meter unable to read) not good for his long term health.    Assessment & Plan:  A-hyperglycemia, diabetes type II insulin with complications  P[facility].  Talk with nurse at diabetes clinic regarding refills for freestyle libre monitor/glucose monitoring patches to see if covered by clinical study or if he requires PCM to write refills for him at 1 week follow up post hospitalization.  Labs due prior to his PCM appt BMP with magnesium per discharge note.    Dispensed omeprazole DR 20mg  po daily #30 RF0 and potassium chloride 40meq po daily (take 2 20meq tabs) #60 RF0 from [facility] to patient.    Encouraged patient to take all of his prescribed oral medications and that he probably needs his insulin more than prior to hospital admission since he is no longer taking oral glucose lowering medications.   Avoid dehydration. Healthy food choices with less added sugar/concentrated sugars. Discussed with patient diarrhea and high blood  sugars probable cause of low potassium and magnesium serum levels.  Diarrhea related to less healthy food choices and very high serum blood sugars more than likely.  If no resolution with decreased blood sugars recommend follow up with GI/PCM. Go to ER if LOC/chest pain/SOB/vomiting/pooping/peeing blood. Follow up with RN Rolly Salter after lunch for sliding scale administration/blood sugar recheck.  Follow up with clinic staff 48 hours (Thursday) for assistance with insulin administration and blood sugar checks again.  [facility]care handouts on blood sugar monitoring, carbohydrate counting and insulin administration.  Patient verbalized understanding information/instructions, agreed with plan of care and had no further questions at this time.

## 2017-01-26 NOTE — Patient Instructions (Addendum)
Schedule PCM visit for next week Keep appt with Diabetes Nurse as scheduled Continue monitoring your blood sugars and administering sliding scale and lantus as prescribed  How and Where to Give Subcutaneous Insulin Injections, Adult People with type 1 diabetes must take insulin since their bodies do not make it. People with type 2 diabetes may require insulin. There are many different types of insulin as well as other injectable diabetes medicines that are meant to be injected into the fat layer under your skin. The type of insulin or injectable diabetes medicine you take may determine how many injections you give yourself and when to take the injections. Choosing a site for injection Insulin absorption varies from site to site. As with any injectable medication it is best for the insulin to be injected within the same body region. However, do not inject the insulin in the same spot each time. Rotating the spots you give your injections will prevent inflammation or tissue breakdown. There are four main regions that can be used for injections. The regions include the:  Abdomen (preferred region, especially for non-insulin injectable diabetes medicine).  Front and upper outer sides of thighs.  Back of upper arm.  Buttocks.  Using a syringe and vial Drawing up insulin: single insulin dose  1. Wash your hands with soap and water. 2. Gently roll the insulin bottle (vial) between your hands to mix it. Do not shake the vial. 3. Clean the top rubber part of the vial with an alcohol wipe. Be sure that the plastic pop-top has been removed on newer vials. 4. Remove the plastic cover from the needle on the syringe. Do not let the needle touch anything. 5. Pull the plunger back to draw air into the syringe. The air should be the same amount as the insulin dose. 6. Push the needle through the rubber on the top of the vial. Do not turn the vial over. 7. Push the plunger in all the way to put the air into  the vial. 8. Leave the needle in the vial and turn the vial and syringe upside down. 9. Pull down slowly on the plunger, drawing the amount of insulin you need into the syringe. 10. Look for air bubbles in the syringe. You may need to push the plunger up and down 2 to 3 times to slowly get rid of any air bubbles in the syringe. 11. Pull back the plunger to get your correct dose. 12. Remove the needle from the vial. 13. Use an alcohol wipe to clean the area of the body to be injected. 14. Pinch up 1 inch of skin and hold it. 15. Put the needle straight into the skin (90-degree angle). Put the needle in as far as it will go (to the hub). The needle may need to be injected at a 45-degree angle in small adults with little fat. 16. When the needle is in, you can let go of your skin. 17. Push the plunger down all the way to inject the insulin. 18. Pull the needle straight out of the skin. 19. Press the alcohol wipe over the spot where you gave your injection. Keep it there for a few seconds. Do not rub the area. 20. Do not put the plastic cover back on the needle. Drawing up insulin: mixing 2 insulins 1. Wash your hands with soap and water. 2. Gently roll the vial of "cloudy" insulin between your hands or rotate the vial from top to bottom to mix. 3. Clean the  top of both vials with an alcohol wipe. Be sure that the plastic pop-top lid has been removed on newer vials. 4. Pull air into the syringe to equal the dose of "cloudy" insulin. 5. Stick the needle into the "cloudy" insulin vial and inject the air. Be sure to keep the vial upright. 6. Remove the needle from the "cloudy" insulin vial. 7. Pull air into the syringe to equal the dose of "clear" insulin. 8. Stick the needle into the "clear" insulin vial and inject the air. 9. Leave the needle in the "clear" insulin vial and turn the vial upside down. 10. Pull down on the plunger and slowly draw into the syringe the number of units of "clear"  insulin desired. 11. Look for air bubbles in the syringe. You may need to push the plunger up and down 2 to 3 times to slowly get rid of any air bubbles in the syringe. 12. Remove the needle from the "clear" insulin vial. 13. Stick the needle into the "cloudy" insulin vial. Do not inject any of the "clear" insulin into the "cloudy" vial. 14. Turn the "cloudy" vial upside down and pull the plunger down to the number of units that equals the total number of units of "clear" and "cloudy" insulins. 15. Remove the needle from the "cloudy" insulin vial. 16. Use an alcohol wipe to clean the area of the body to be injected. 17. Put the needle straight into the skin (90-degree angle). Put the needle in as far as it will go (to the hub). The needle may need to be injected at a 45-degree angle in small adults with little fat. 18. When the needle is in, you can let go of your skin. 19. Push the plunger down all the way to inject the insulin. 20. Pull the needle straight out of the skin. 21. Press the alcohol wipe over the spot where you gave your injection. Keep it there for a few seconds. Do not rub the area. 22. Do not put the plastic cover back on the needle. Using an insulin pen  1. Wash your hands with soap and water. 2. If you are using the "cloudy" insulin, roll the pen between your palms several times or rotate the pen top to bottom several times. 3. Remove the insulin pen cap. 4. Clean the rubber stopper of the cartridge with an alcohol wipe. 5. Remove the protective paper tab from the disposable needle. 6. Screw the needle onto the pen. 7. Remove the outer plastic needle cover. 8. Remove the inner plastic needle cover. 9. Prime the insulin pen by turning the button (dial) to 2 units. Hold the pen with the needle pointing up, and push the dial on the opposite end until a drop of insulin appears at the needle tip. If no insulin appears, repeat this step. 10. Dial the number of units of insulin  you will inject. 11. Use an alcohol wipe to clean the area of the body to be injected. 12. Pinch up 1 inch of skin and hold it. 13. Put the needle straight into the skin (90-degree angle). 14. Push the dial down to push the insulin into the fat tissue. 15. Count to 10 slowly. Then, remove the needle from the fat tissue. 16. Carefully replace the larger outer plastic needle cover over the needle and unscrew the capped needle. Throwing away supplies  Discard used needles in a puncture proof sharps disposal container. Follow disposal regulations for the area where you live.  Vials and  empty disposable pens may be thrown away in the regular trash. This information is not intended to replace advice given to you by your health care provider. Make sure you discuss any questions you have with your health care provider. Document Released: 06/06/2003 Document Revised: 08/22/2015 Document Reviewed: 08/23/2012 Elsevier Interactive Patient Education  2018 [employer] Carbohydrate Counting for Diabetes Mellitus, Adult Carbohydrate counting is a method for keeping track of how many carbohydrates you eat. Eating carbohydrates naturally increases the amount of sugar (glucose) in the blood. Counting how many carbohydrates you eat helps keep your blood glucose within normal limits, which helps you manage your diabetes (diabetes mellitus). It is important to know how many carbohydrates you can safely have in each meal. This is different for every person. A diet and nutrition specialist (registered dietitian) can help you make a meal plan and calculate how many carbohydrates you should have at each meal and snack. Carbohydrates are found in the following foods:  Grains, such as breads and cereals.  Dried beans and soy products.  Starchy vegetables, such as potatoes, peas, and corn.  Fruit and fruit juices.  Milk and yogurt.  Sweets and snack foods, such as cake, cookies, candy, chips, and soft  drinks.  How do I count carbohydrates? There are two ways to count carbohydrates in food. You can use either of the methods or a combination of both. Reading "Nutrition Facts" on packaged food The "Nutrition Facts" list is included on the labels of almost all packaged foods and beverages in the [place] It includes:  The serving size.  Information about nutrients in each serving, including the grams (g) of carbohydrate per serving.  To use the "Nutrition Facts":  Decide how many servings you will have.  Multiply the number of servings by the number of carbohydrates per serving.  The resulting number is the total amount of carbohydrates that you will be having.  Learning standard serving sizes of other foods When you eat foods containing carbohydrates that are not packaged or do not include "Nutrition Facts" on the label, you need to measure the servings in order to count the amount of carbohydrates:  Measure the foods that you will eat with a food scale or measuring cup, if needed.  Decide how many standard-size servings you will eat.  Multiply the number of servings by 15. Most carbohydrate-rich foods have about 15 g of carbohydrates per serving. ? For example, if you eat 8 oz (170 g) of strawberries, you will have eaten 2 servings and 30 g of carbohydrates (2 servings x 15 g = 30 g).  For foods that have more than one food mixed, such as soups and casseroles, you must count the carbohydrates in each food that is included.  The following list contains standard serving sizes of common carbohydrate-rich foods. Each of these servings has about 15 g of carbohydrates:   hamburger bun or  English muffin.   oz (15 mL) syrup.   oz (14 g) jelly.  1 slice of bread.  1 six-inch tortilla.  3 oz (85 g) cooked rice or pasta.  4 oz (113 g) cooked dried beans.  4 oz (113 g) starchy vegetable, such as peas, corn, or potatoes.  4 oz (113 g) hot cereal.  4 oz (113 g) mashed potatoes  or  of a large baked potato.  4 oz (113 g) canned or frozen fruit.  4 oz (120 mL) fruit juice.  4-6 crackers.  6 chicken nuggets.  6 oz (170  g) unsweetened dry cereal.  6 oz (170 g) plain fat-free yogurt or yogurt sweetened with artificial sweeteners.  8 oz (240 mL) milk.  8 oz (170 g) fresh fruit or one small piece of fruit.  24 oz (680 g) popped popcorn.  Example of carbohydrate counting Sample meal  3 oz (85 g) chicken breast.  6 oz (170 g) brown rice.  4 oz (113 g) corn.  8 oz (240 mL) milk.  8 oz (170 g) strawberries with sugar-free whipped topping. Carbohydrate calculation 1. Identify the foods that contain carbohydrates: ? Rice. ? Corn. ? Milk. ? Strawberries. 2. Calculate how many servings you have of each food: ? 2 servings rice. ? 1 serving corn. ? 1 serving milk. ? 1 serving strawberries. 3. Multiply each number of servings by 15 g: ? 2 servings rice x 15 g = 30 g. ? 1 serving corn x 15 g = 15 g. ? 1 serving milk x 15 g = 15 g. ? 1 serving strawberries x 15 g = 15 g. 4. Add together all of the amounts to find the total grams of carbohydrates eaten: ? 30 g + 15 g + 15 g + 15 g = 75 g of carbohydrates total. This information is not intended to replace advice given to you by your health care provider. Make sure you discuss any questions you have with your health care provider. Document Released: 03/16/2005 Document Revised: 10/04/2015 Document Reviewed: 08/28/2015 Elsevier Interactive Patient Education  2018 [employer] Blood Glucose Monitoring, Adult Monitoring your blood sugar (glucose) helps you manage your diabetes. It also helps you and your health care provider determine how well your diabetes management plan is working. Blood glucose monitoring involves checking your blood glucose as often as directed, and keeping a record (log) of your results over time. Why should I monitor my blood glucose? Checking your blood glucose regularly  can:  Help you understand how food, exercise, illnesses, and medicines affect your blood glucose.  Let you know what your blood glucose is at any time. You can quickly tell if you are having low blood glucose (hypoglycemia) or high blood glucose (hyperglycemia).  Help you and your health care provider adjust your medicines as needed.  When should I check my blood glucose? Follow instructions from your health care provider about how often to check your blood glucose. This may depend on:  The type of diabetes you have.  How well-controlled your diabetes is.  Medicines you are taking.  If you have type 1 diabetes:  Check your blood glucose at least 2 times a day.  Also check your blood glucose: ? Before every insulin injection. ? Before and after exercise. ? Between meals. ? 2 hours after a meal. ? Occasionally between 2:00 a.m. and 3:00 a.m., as directed. ? Before potentially dangerous tasks, like driving or using heavy machinery. ? At bedtime.  You may need to check your blood glucose more often, up to 6-10 times a day: ? If you use an insulin pump. ? If you need multiple daily injections (MDI). ? If your diabetes is not well-controlled. ? If you are ill. ? If you have a history of severe hypoglycemia. ? If you have a history of not knowing when your blood glucose is getting low (hypoglycemia unawareness). If you have type 2 diabetes:  If you take insulin or other diabetes medicines, check your blood glucose at least 2 times a day.  If you are on intensive insulin therapy, check your  blood glucose at least 4 times a day. Occasionally, you may also need to check between 2:00 a.m. and 3:00 a.m., as directed.  Also check your blood glucose: ? Before and after exercise. ? Before potentially dangerous tasks, like driving or using heavy machinery.  You may need to check your blood glucose more often if: ? Your medicine is being adjusted. ? Your diabetes is not  well-controlled. ? You are ill. What is a blood glucose log?  A blood glucose log is a record of your blood glucose readings. It helps you and your health care provider: ? Look for patterns in your blood glucose over time. ? Adjust your diabetes management plan as needed.  Every time you check your blood glucose, write down your result and notes about things that may be affecting your blood glucose, such as your diet and exercise for the day.  Most glucose meters store a record of glucose readings in the meter. Some meters allow you to download your records to a computer. How do I check my blood glucose? Follow these steps to get accurate readings of your blood glucose: Supplies needed   Blood glucose meter.  Test strips for your meter. Each meter has its own strips. You must use the strips that come with your meter.  A needle to prick your finger (lancet). Do not use lancets more than once.  A device that holds the lancet (lancing device).  A journal or log book to write down your results. Procedure  Wash your hands with soap and water.  Prick the side of your finger (not the tip) with the lancet. Use a different finger each time.  Gently rub the finger until a small drop of blood appears.  Follow instructions that come with your meter for inserting the test strip, applying blood to the strip, and using your blood glucose meter.  Write down your result and any notes. Alternative testing sites  Some meters allow you to use areas of your body other than your finger (alternative sites) to test your blood.  If you think you may have hypoglycemia, or if you have hypoglycemia unawareness, do not use alternative sites. Use your finger instead.  Alternative sites may not be as accurate as the fingers, because blood flow is slower in these areas. This means that the result you get may be delayed, and it may be different from the result that you would get from your finger.  The  most common alternative sites are: ? Forearm. ? Thigh. ? Palm of the hand. Additional tips  Always keep your supplies with you.  If you have questions or need help, all blood glucose meters have a 24-hour "hotline" number that you can call. You may also contact your health care provider.  After you use a few boxes of test strips, adjust (calibrate) your blood glucose meter by following instructions that came with your meter. This information is not intended to replace advice given to you by your health care provider. Make sure you discuss any questions you have with your health care provider. Document Released: 03/19/2003 Document Revised: 10/04/2015 Document Reviewed: 08/26/2015 Elsevier Interactive Patient Education  2017 [employer].

## 2017-01-26 NOTE — Progress Notes (Signed)
error 

## 2017-01-28 ENCOUNTER — Ambulatory Visit: Payer: Self-pay | Admitting: *Deleted

## 2017-01-28 VITALS — BP 129/89 | HR 95 | Temp 98.9°F

## 2017-01-28 DIAGNOSIS — Z794 Long term (current) use of insulin: Principal | ICD-10-CM

## 2017-01-28 DIAGNOSIS — E1165 Type 2 diabetes mellitus with hyperglycemia: Secondary | ICD-10-CM

## 2017-01-28 NOTE — Progress Notes (Signed)
Pt seen at 1045. Feels "like I'm going to pass out." Head nodding down while sitting in exam chair, but quickly responsive to questions. Reports chills and nausea this am. Has not checked cbg with patch this am. Did not bring meter or insulin with him to clinic, is at his desk. Has not taken any of his meds this am. CBG fingerstick done in clinic, reading >600.  Pt returns at 11:00. Brings insulin back. 12units administered per sliding scale to R posterior upper arm. Sts he vomited once when returning to desk previously.  1200: Pt returns to clinic. Sts his meter is still reading HI (>500). Endorses vomiting and diarrhea today. Sts he went back to his desk earlier and took "all of my meds", including Lantus. Strongly encouraged him to make sure he takes every dose of his insulin today, short and long acting, eat and drink appropriately, hydrate well while waiting for blood work to return to hopefully get blood sugar under control and avoid another hospital admission for DKA. Pt still has not made appt for hospital d/c f/u with pcp. Instructed him to do that today.  Labs drawn per NP orders 2/2 n/v/d and hyperglycemia.

## 2017-01-29 ENCOUNTER — Ambulatory Visit: Payer: Self-pay | Admitting: *Deleted

## 2017-01-29 DIAGNOSIS — E1165 Type 2 diabetes mellitus with hyperglycemia: Secondary | ICD-10-CM

## 2017-01-29 DIAGNOSIS — Z794 Long term (current) use of insulin: Principal | ICD-10-CM

## 2017-01-29 NOTE — Progress Notes (Signed)
Pt presents at RN page request. NP spoke with pt via phone this am. Relayed critical glucose level and abnormal electrolyte levels, advised per NP and Dr. Sullivan LoneGilbert to proceed to ED for eval. Pt refused as he was feeling better this morning and the n/v resolved overnight. Stated to RN that he took his Lantus this morning and administered his Humalog 12 units in the warehouse this am after speaking with NP. Libre meter now reading 423. Pt more responsive and conversive today as compared to yesterday.  He will not take lunch until after 1pm. Advised him if he goes before 2:30 that RN will be in house and can assist with meal time insulin inj if needed. Pt advised of need to repeat BMET and Mag to reeval since sx have improved. Pt agreeable to this. Labs drawn. Advised pt if any were critical or warranted further eval over the weekend, he would be contacted by myself or NP. He stated to leave a message as he does not answer numbers is he does not know.

## 2017-01-29 NOTE — Progress Notes (Signed)
RN saw pt this am. See separate encounter for 01/29/17.

## 2017-01-30 LAB — BASIC METABOLIC PANEL
BUN/Creatinine Ratio: 7 — ABNORMAL LOW (ref 9–20)
BUN/Creatinine Ratio: 6 — ABNORMAL LOW (ref 9–20)
BUN: 4 mg/dL — ABNORMAL LOW (ref 6–24)
BUN: 4 mg/dL — ABNORMAL LOW (ref 6–24)
CALCIUM: 8.8 mg/dL (ref 8.7–10.2)
CO2: 25 mmol/L (ref 20–29)
CO2: 28 mmol/L (ref 20–29)
CREATININE: 0.56 mg/dL — AB (ref 0.76–1.27)
CREATININE: 0.68 mg/dL — AB (ref 0.76–1.27)
Calcium: 9.4 mg/dL (ref 8.7–10.2)
Chloride: 78 mmol/L — ABNORMAL LOW (ref 96–106)
Chloride: 82 mmol/L — ABNORMAL LOW (ref 96–106)
GFR calc Af Amer: 141 mL/min/{1.73_m2} (ref >59)
GFR calc Af Amer: 131 mL/min/{1.73_m2} (ref >59)
GFR calc non Af Amer: 122 mL/min/{1.73_m2} (ref >59)
GFR calc non Af Amer: 113 mL/min/{1.73_m2} (ref >59)
GLUCOSE: 719 mg/dL — AB (ref 65–99)
GLUCOSE: 373 mg/dL — AB (ref 65–99)
POTASSIUM: 3.3 mmol/L — AB (ref 3.5–5.2)
POTASSIUM: 3.1 mmol/L — AB (ref 3.5–5.2)
SODIUM: 129 mmol/L — AB (ref 134–144)
SODIUM: 133 mmol/L — AB (ref 134–144)

## 2017-01-30 LAB — MAGNESIUM
MAGNESIUM: 1.4 mg/dL — AB (ref 1.6–2.3)
MAGNESIUM: 1.4 mg/dL — AB (ref 1.6–2.3)

## 2017-01-30 LAB — ETHANOL: ETHANOL LVL: 0.073 %

## 2017-02-04 ENCOUNTER — Telehealth: Payer: Self-pay | Admitting: Registered Nurse

## 2017-02-04 ENCOUNTER — Encounter: Payer: Self-pay | Admitting: Registered Nurse

## 2017-02-04 MED ORDER — CONTINUOUS BLOOD GLUC SENSOR MISC: 1.0000 | 0 refills | Status: AC

## 2017-02-04 NOTE — Telephone Encounter (Signed)
Contacted patient as he did not follow up with [facility] RN Nance PewHaley Workman as instructed Tuesday 48 hours ago.  Patient reported his freestyle libre patch fell off Tuesday hasn't checked blood sugars since then has been administering 10 units sliding scale and his regular lantus by himself.  Patient drinking water today.  Usual for meals. Discussed with patient that this is a dangerous practice as could drive his blood sugar too low.   "I don't feel low"  "I feel worse when low than high"  Discussed with patient to come to clinic for finger stick and to contact his diabetes study RN as he still has not spoken with her to schedule his follow up or schedule PCM follow up.  Will enter bridge refill of freestyle libre patches for blood sugar monitoring for patient to pharmacy of choice also.  Patient verbalized understanding information/instructions and had no further questions at this time.

## 2017-02-05 ENCOUNTER — Ambulatory Visit: Payer: Self-pay | Admitting: *Deleted

## 2017-02-05 DIAGNOSIS — Z794 Long term (current) use of insulin: Principal | ICD-10-CM

## 2017-02-05 DIAGNOSIS — E1165 Type 2 diabetes mellitus with hyperglycemia: Secondary | ICD-10-CM

## 2017-02-05 LAB — GLUCOSE, POCT (MANUAL RESULT ENTRY): POC Glucose: >600 mg/dl (ref 70–99)

## 2017-02-05 NOTE — Addendum Note (Signed)
Addended by: Loraine GripWORKMAN, Anastacio Bua S on: 02/05/2017 01:32 PM   Modules accepted: Orders

## 2017-02-05 NOTE — Progress Notes (Addendum)
1050: Pt presents to RN clinic stating "I don't know if I'm overloaded, or in a panic attack." Clarifies "overloaded", meaning "sugar too high." Reports lightheadedness, shakiness, "can't feel my fingers, they are too cold."  Last Lantus dose yesterday morning. Reports all 3 Humalog doses taken yesterday. Did not take Lantus last night, or any of his meds this am. Did not pick up refill of Libre patches stating he went straight home last night because he didn't feel well, then later stated the pharmacy didn't call him to tell it was ready. Has not checked sugar with regular meter. RN cbg meter reading "HI" (>600).  Pt denies n/v. Sts he has drank some gatorade this am. Advised pt to fetch his insulin and RN would help administer Humalog, and Lantus if he has it with him.   1110: 12units Humalog administered L posterior upper arm. Pt self administered 22 units Lantus to abdomen. Plan to recheck cbg in 30-45 minutes. If reading unchanged, and sx persists, will advise to be evaluated in ED.   1150: Pt returns. Sx slightly improved. Still lightheaded. CBG >600 still. Endorses diarrhea which has been occurring for past few months for pt. Reports it is "still improving" as it was earlier in the week. 4-5 episodes during day, was previously 8-10. Has only tried one dose of Immodium once, "I've been meaning to try it again." Advised of administration instructions and likely need for multiple doses.  Pt slightly smelled of aftershave at first visit today. Smell was much stronger at third interaction.  As pt is improving, he prefers not to go to the ED. Has not called pcp for hospital d/c f/u appt, or [facility]. Advised him to call both today. Discussed pt's condition with NP via phone today. Labs drawn per verbal order. Alcohol prep completely dry at time of stick. Order of tube draw: gel barrier, lavender, gray. NP or RN will call pt with any critical results tomorrow. Pt requests message to be left as  he does not answer phone numbers he is not familiar with.

## 2017-02-07 ENCOUNTER — Telehealth: Payer: Self-pay | Admitting: Registered Nurse

## 2017-02-07 NOTE — Telephone Encounter (Signed)
Attempted to notify patient of BMP and magnesium results.  No answer and voicemail full.  See results note 02/06/2017 for instructions to give patient upon arrival to work 02/08/2017.  RN Nance PewHaley Workman in clinic at employer site 02/08/2017.

## 2017-02-08 NOTE — Progress Notes (Signed)
Pt in to clinic this am requesting help applying Libre patch. Reviewed NP notes and recommendations from over the weekend when pt could not be reached. He reports feeling a little better and that the diarrhea had slightly improved. Has not tried Immodium over the weekend as suggested. Reviewed correlation of high blood sugars to diabetes and depleted electrolytes.   Upon review of pts AVS from hospital d/c searching for [facility] study coordinator contact info, found that pt had Loperamide e-prescribed to [employer], along with [place] patches, potassium, and all of his insulins, insulin needles, lancets, etc. Instructed pt to go pick up the loperamide and any other of the meds he may need. Given NP's instructions to take extra dose of K+ tonight and tomorrow.    Assisted pt with applying Libre patch to posterior L upper arm. Will take 12 hours to come to temp. Checked fingerstick CBG, 497. Pt stated he would bring Humalog back to clinic shortly to have RN administer to alt site.   Called Dr. Cheri FowlerValezquez's office with pt in clinic to make f/u appt. Dr. Zoe LanValezquez not available for f/u appt until 11/20. Appt instead made with PA Cheryl FlashLucy Barden on 11/14. Pt refused am appt, and accepted 3:20pm appt slot. Details written down and given to pt. He again states concern with his work position. Informed him again that HR has told him that as long as he gives notice and work note, he will be excused for appointments. He sts this has never been told to him despite myself and Inetta Fermoina, NP both personally telling him after both recent hospital admissions, and HR rep reporting they have discussed this with pt as well. Reassured pt that he needs to speak with HR and inform them appt and he will be able to attend it. Pt agreeable to this, then begins to say that he feels bad today and would like RN to discuss with HR being excused from work to go home. Instructed pt to speak with supervisor regarding daily work and leaving early. Pt  aware RN unable to excuse pt from work. Recent lab results routed to PA that will be seeing pt in advance of appt.  When questioned about calling study nurse, he reports he has not heard from them since playing phone tag last week, however pt's VM has been full and not accepting messages. Then he reports not having any contact information for them. RN paged inpatient diabetes coordinator number that was found in notes. She reports she will have her study coordinator contact the pt soon. Asked her to inform coordinator that pt has not had a patch in place for 6 days and just applied a new one that is not to temp yet.  Pt made aware to expect phone call, clear VM messages so mesg can be left and if able, answer a [phone] caller. Pt agreeable to this.   Ethanol result still pending. Called [facility] for f/u. [employer] only ran M-F once a day on one shift and have a 3 day turnaround. Branch is reporting result should be available Wednesday 11/14.   Appt made for tomorrow am to see NP for f/u regarding labs and diarrhea.

## 2017-02-09 ENCOUNTER — Ambulatory Visit: Payer: Self-pay | Admitting: Registered Nurse

## 2017-02-09 ENCOUNTER — Telehealth: Payer: Self-pay | Admitting: *Deleted

## 2017-02-09 VITALS — BP 101/77 | HR 90 | Temp 97.1°F

## 2017-02-09 DIAGNOSIS — R197 Diarrhea, unspecified: Secondary | ICD-10-CM

## 2017-02-09 DIAGNOSIS — IMO0002 Reserved for concepts with insufficient information to code with codable children: Secondary | ICD-10-CM

## 2017-02-09 DIAGNOSIS — E876 Hypokalemia: Secondary | ICD-10-CM

## 2017-02-09 DIAGNOSIS — F331 Major depressive disorder, recurrent, moderate: Secondary | ICD-10-CM

## 2017-02-09 DIAGNOSIS — Z794 Long term (current) use of insulin: Principal | ICD-10-CM

## 2017-02-09 DIAGNOSIS — E1142 Type 2 diabetes mellitus with diabetic polyneuropathy: Secondary | ICD-10-CM

## 2017-02-09 DIAGNOSIS — E1165 Type 2 diabetes mellitus with hyperglycemia: Principal | ICD-10-CM

## 2017-02-09 LAB — GLUCOSE, POCT (MANUAL RESULT ENTRY): POC Glucose: 565 mg/dl — AB (ref 70–99)

## 2017-02-09 NOTE — Progress Notes (Signed)
Subjective:    Patient ID: Terry Schultz, male    DOB: 1970-09-14, 46 y.o.   MRN: [id number]  46y/o caucasian male divorced established patient here for f/u, hyperglycemia. Diarrhea persists. Reports he tried to pick up Immodium Rx yesterday but pharmacy had to order it and it wont be in until Friday. Did not activate freestyle libre patch with meter until 6am today, so will not have reading until 6pm tonight. CBG check 565 in clinic at time of appt. Patient reported he has not had any insulins since yesterday morning when RN assisted patient. When questioned why, pt just reports "because I'm lazy." Reminded pt that not taking his meds and consistently letting his blood sugar be so elevated will lead to organ damage, coma, early death. Pt states he knows this and does not know why he is not taking his meds, "I don't have an answer for that." Pt given PHQ-9 to begin filling out in clinic exam room.   Patient stated difficulty getting out of bed recently due to "no energy"  He stated, "I love rain and fall this is my favorite time of the year.  I probably need to restart seeing my mental health provider I haven't seen him since I got discharged from the rehab hospital this summer."  Patient denied plan or thoughts of HI/SI.  Feels that his supervisor vindictive if he misses any work time for doctors appts.  He spoke with Crisoforo Oxford, HR Rep, to discuss if any FMLA time remaining today, she did not think he had any hours remaining but encouraged him to call [employer]-company that manages FMLA program for [employer].  Appt scheduled tomorrow 1520 with PCM office but seeing PA as Dr Baldwin [place] booked out.  Did not eat breakfast today only chocolate milk.  Brought [place] for lunch.  Stated he has lots of food at his apt and options just doesn't feel like prepping/cooking so eats fast food instead.  He stated his parents send him money to pay for mental health copays each month.  Hasn't used  [facility] behavioral due to patient thinks $45 copay is more expensive than office visit $20  Cannot pick up loperamide until Thursday or Friday of this week to use his pharmacy coverage versus OTC.  Per patient diarrhea episodes too many to count yesterday.  Last stool 0600 today.  Is drinking water at his workstation.  Denied headache, LOC, vomiting, abdomen pain, bloody stools, rashes, dyspnea, dysphagia.  Has all needed medications including small supply of loperamide which he didn't want to use all of it until new supply obtained.      Review of Systems  Constitutional: Positive for chills and diaphoresis. Negative for activity change, appetite change, fatigue, fever and unexpected weight change.  HENT: Negative for congestion, dental problem, drooling, ear discharge, ear pain, facial swelling, hearing loss, mouth sores, nosebleeds, postnasal drip, rhinorrhea, sinus pressure, sinus pain, sneezing, sore throat, tinnitus, trouble swallowing and voice change.   Eyes: Negative for photophobia, pain, discharge, redness, itching and visual disturbance.  Respiratory: Negative for cough, choking, chest tightness, shortness of breath, wheezing and stridor.   Cardiovascular: Negative for chest pain, palpitations and leg swelling.  Gastrointestinal: Positive for diarrhea and nausea. Negative for abdominal distention, abdominal pain, blood in stool, constipation and vomiting.  Endocrine: Negative for cold intolerance and heat intolerance.  Genitourinary: Negative for dysuria.  Musculoskeletal: Negative for arthralgias, back pain, gait problem, joint swelling, myalgias, neck pain and neck stiffness.  Skin: Negative  for color change, pallor, rash and wound.  Allergic/Immunologic: Positive for immunocompromised state. Negative for environmental allergies and food allergies.  Neurological: Positive for numbness. Negative for dizziness, tremors, seizures, syncope, facial asymmetry, speech difficulty, weakness,  light-headedness and headaches.  Hematological: Negative for adenopathy. Does not bruise/bleed easily.  Psychiatric/Behavioral: Positive for dysphoric mood. Negative for agitation, behavioral problems, confusion, decreased concentration, hallucinations, self-injury, sleep disturbance and suicidal ideas. The patient is nervous/anxious. The patient is not hyperactive.        Objective:   Physical Exam  Constitutional: He is oriented to person, place, and time. Vital signs are normal. He appears well-developed and well-nourished. He is active and cooperative.  Non-toxic appearance. He does not have a sickly appearance. He does not appear ill. No distress.  HENT:  Head: Normocephalic and atraumatic.  Right Ear: Hearing, external ear and ear canal normal. A middle ear effusion is present.  Left Ear: Hearing, external ear and ear canal normal. A middle ear effusion is present.  Nose: No mucosal edema, rhinorrhea, nose lacerations, sinus tenderness, nasal deformity, septal deviation or nasal septal hematoma. No epistaxis.  No foreign bodies. Right sinus exhibits no maxillary sinus tenderness and no frontal sinus tenderness. Left sinus exhibits no maxillary sinus tenderness and no frontal sinus tenderness.  Mouth/Throat: Uvula is midline and mucous membranes are normal. Mucous membranes are not pale, not dry and not cyanotic. He does not have dentures. No oral lesions. No trismus in the jaw. Normal dentition. No dental abscesses, uvula swelling, lacerations or dental caries. No oropharyngeal exudate, posterior oropharyngeal edema, posterior oropharyngeal erythema or tonsillar abscesses.  Bilateral TMs air fluid level clear  Eyes: Conjunctivae, EOM and lids are normal. Pupils are equal, round, and reactive to light. Right eye exhibits no chemosis, no discharge, no exudate and no hordeolum. No foreign body present in the right eye. Left eye exhibits no chemosis, no discharge, no exudate and no hordeolum. No  foreign body present in the left eye. Right conjunctiva is not injected. Right conjunctiva has no hemorrhage. Left conjunctiva is not injected. Left conjunctiva has no hemorrhage. No scleral icterus. Right eye exhibits normal extraocular motion and no nystagmus. Left eye exhibits normal extraocular motion and no nystagmus. Right pupil is round and reactive. Left pupil is round and reactive. Pupils are equal.  Wearing glasses  Neck: Trachea normal, normal range of motion and phonation normal. Neck supple. No tracheal tenderness, no spinous process tenderness and no muscular tenderness present. No neck rigidity. No tracheal deviation, no edema, no erythema and normal range of motion present. No thyroid mass and no thyromegaly present.  Cardiovascular: Normal rate, regular rhythm, S1 normal, S2 normal, normal heart sounds and intact distal pulses. PMI is not displaced. Exam reveals no gallop and no friction rub.  No murmur heard. Pulses:      Radial pulses are 2+ on the right side, and 2+ on the left side.  Pulmonary/Chest: Effort normal and breath sounds normal. No accessory muscle usage or stridor. No respiratory distress. He has no decreased breath sounds. He has no wheezes. He has no rhonchi. He has no rales.  No cough observed in exam room; spoke full sentences without difficulty  Abdominal: Soft. Normal appearance. He exhibits no shifting dullness, no distension, no pulsatile liver, no fluid wave, no abdominal bruit, no ascites, no pulsatile midline mass and no mass. Bowel sounds are decreased. There is no hepatosplenomegaly. There is no tenderness. There is no rigidity, no rebound, no guarding, no CVA tenderness, no  tenderness at McBurney's point and negative Murphy's sign. Hernia confirmed negative in the ventral area.  Dull to percussion x 4 quads; hypoactive bowel sounds x 4 quads  Musculoskeletal: Normal range of motion. He exhibits no edema or tenderness.       Right shoulder: Normal.        Left shoulder: Normal.       Right elbow: Normal.      Left elbow: Normal.       Right hip: Normal.       Left hip: Normal.       Right knee: Normal.       Left knee: Normal.       Cervical back: Normal.       Thoracic back: Normal.       Lumbar back: Normal.       Right hand: Normal.       Left hand: Normal.  On/off exam table/in/out of chair without difficulty; gait sure and steady in hall; sat crosslegged/indian style on exam table  Lymphadenopathy:       Head (right side): No submental, no submandibular, no tonsillar, no preauricular, no posterior auricular and no occipital adenopathy present.       Head (left side): No submental, no submandibular, no tonsillar, no preauricular, no posterior auricular and no occipital adenopathy present.    He has no cervical adenopathy.       Right cervical: No superficial cervical, no deep cervical and no posterior cervical adenopathy present.      Left cervical: No superficial cervical, no deep cervical and no posterior cervical adenopathy present.  Neurological: He is alert and oriented to person, place, and time. He has normal strength. He displays no atrophy and no tremor. No cranial nerve deficit or sensory deficit. He exhibits normal muscle tone. He displays no seizure activity. Coordination and gait normal. GCS eye subscore is 4. GCS verbal subscore is 5. GCS motor subscore is 6.  Bilateral upper and lower 5/5 extremity strength equal; gait slow and steady in hallway  Skin: Skin is warm, dry and intact. Rash noted. No abrasion, no bruising, no burn, no ecchymosis, no laceration, no lesion, no petechiae and no purpura noted. Rash is macular. Rash is not papular, not maculopapular, not nodular, not pustular, not vesicular and not urticarial. He is not diaphoretic. No cyanosis or erythema. No pallor. Nails show no clubbing.     Hyperpigmentation noted bilateral lower legs patient wearing shorts  Psychiatric: He has a normal mood and affect. His  speech is normal and behavior is normal. Judgment and thought content normal. Cognition and memory are normal.  Poor eye contact; PHQ-9 completed   Nursing note and vitals reviewed.         Assessment & Plan:  A-uncontrolled diabetes Type II on insulin, diarrhea, hypomagnesemia, hypokalemia  P-talked about behavior helath options and currently $0 copay first visit and $10 through end of December for subsequent visits with [facility] and/or call your usual mental health provider for office visit.  Discussed [facility] may have other time options that assist with decreasing his work stress.  See outpatient record for  PHQ 9 results. No S/I or H/I, plan, or ideation.  Discussed other resources patient may use if symptoms worsen EMERGENCY ROOM, chaplain, PCM on call or Urgent Care Center.  Return to the clinic if any new or worsening symptoms. Patient given Exitcare handout on major depression. Patient verbalized understanding of information/instructions, agreed with plan of care and had no further  questions at this time. P2:  Diet and Exercise.  Stress reduction.   PCM visit for diabetes tomorrow afternoon at 1520 keep appt.  Discussed shorts and cold weather could lead to skin injury since he has decreased sensation already due to uncontrolled diabetes. Continued uncontrolled blood sugars patient not motivated to administer his insulin and choose healthy dietary choices,   At high risk organ damage and pancreatitis if his dietary and medication choices to not administer insulin continue.  Avoid dehydration. Healthy food choices with less added sugar/concentrated sugars.Discussed with patient diarrhea and high blood sugars probable cause of low potassium and magnesium serum levels.  Diarrhea related to less healthy food choices and very high serum blood sugars more than likely. If no resolution with decreased blood sugars recommend follow up with GI/PCM. Go to ER if LOC/chest pain/SOB/vomiting/pooping/peeing  blood. Follow up with RN Rolly SalterHaley after lunch for sliding scale administration/blood sugar recheck.  Follow up with clinic staff 48 hours (Thursday) for assistance with insulin administration and blood sugar checks again.Follow up with RN Lavonne ChickHaley M, Tu, Th, Fr for assistance with insulin and blood sugar checks if his meter not reading a level.  Exitcare handouts on blood sugar monitoring, carbohydrate counting and insulin administration.  Patient verbalized understanding information/instructions, agreed with plan of care and had no further questions at this time.  Diarrhea probably related to unconrolled diabetes and poor dietary choices.  May use loperamide 1-2g po prn each loose bowel movement if no fever/bloody diarrhea/abdomen pain/vomiting per manufacturer's instructions max 16mg /24 hours.  Exitcare handouts on hypokalemia, hypomagnesemia and foods to relieve diarrhea.  I have recommended clear fluids and advanced to soft as tolerated  Avoid dairy, spicy and fried foods until diarrhea resolves.  Patient to take temperature and if less than 100.5 F may take over the counter Imodium.  Medications as directed.   Return to the clinic if  symptoms persist or worsen; I have alerted the patient to call if high fever, dehydration, marked weakness, fainting, increased abdominal pain, blood in stool or vomit.   Patient verbalized agreement and understanding of treatment plan and had no further questions at this time.   P2:  Hand washing  Discussed with patient diarrhea, hyperglycemia, kidney disease, medications, low dietary intake can cause hypokalemia.  Diarrhea continues.  Potassium chloride 40meq po daily.  [facility]  Return to clinic or ER for re-evaluation if palpitations, muscle cramps, weakness, vomiting, fatigue, syncope.  Avoid alcohol intake.   Exitcare handout on hypokalemia and hypomagnesemia given to patient.  Up to date handout on potassium and magnesium rich foods given to  patient by RN on 08 Feb 2017.  Patient verbalized understanding of information/instructions, agreed with plan of care and had no further questions at this time.

## 2017-02-10 LAB — BASIC METABOLIC PANEL
BUN / CREAT RATIO: 5 — AB (ref 9–20)
BUN: 4 mg/dL — AB (ref 6–24)
CO2: 28 mmol/L (ref 20–29)
CREATININE: 0.79 mg/dL (ref 0.76–1.27)
Calcium: 9.6 mg/dL (ref 8.7–10.2)
Chloride: 75 mmol/L — ABNORMAL LOW (ref 96–106)
GFR, EST AFRICAN AMERICAN: 123 mL/min/{1.73_m2} (ref >59)
GFR, EST NON AFRICAN AMERICAN: 106 mL/min/{1.73_m2} (ref >59)
Glucose: 745 mg/dL (ref 65–99)
Potassium: 3.3 mmol/L — ABNORMAL LOW (ref 3.5–5.2)
SODIUM: 130 mmol/L — AB (ref 134–144)

## 2017-02-10 LAB — ETHANOL: Ethanol: 0.16 %

## 2017-02-10 LAB — MAGNESIUM: Magnesium: 1.5 mg/dL — ABNORMAL LOW (ref 1.6–2.3)

## 2017-02-10 NOTE — Patient Instructions (Addendum)
Keep primary care appt tomorrow at 1520 Contact diabetes study nurse for your follow up appt Schedule mental health provider appt either teledoc or in person (see handouts from RN Rolly Salter)  Hypokalemia Hypokalemia means that the amount of potassium in the blood is lower than normal.Potassium is a chemical that helps regulate the amount of fluid in the body (electrolyte). It also stimulates muscle tightening (contraction) and helps nerves work properly.Normally, most of the body's potassium is inside of cells, and only a very small amount is in the blood. Because the amount in the blood is so small, minor changes to potassium levels in the blood can be life-threatening. What are the causes? This condition may be caused by:  Antibiotic medicine.  Diarrhea or vomiting. Taking too much of a medicine that helps you have a bowel movement (laxative) can cause diarrhea and lead to hypokalemia.  Chronic kidney disease (CKD).  Medicines that help the body get rid of excess fluid (diuretics).  Eating disorders, such as bulimia.  Low magnesium levels in the body.  Sweating a lot.  What are the signs or symptoms? Symptoms of this condition include:  Weakness.  Constipation.  Fatigue.  Muscle cramps.  Mental confusion.  Skipped heartbeats or irregular heartbeat (palpitations).  Tingling or numbness.  How is this diagnosed? This condition is diagnosed with a blood test. How is this treated? Hypokalemia can be treated by taking potassium supplements by mouth or adjusting the medicines that you take. Treatment may also include eating more foods that contain a lot of potassium. If your potassium level is very low, you may need to get potassium through an IV tube in one of your veins and be monitored in the hospital. Follow these instructions at home:  Take over-the-counter and prescription medicines only as told by your health care provider. This includes vitamins and supplements.  Eat  a healthy diet. A healthy diet includes fresh fruits and vegetables, whole grains, healthy fats, and lean proteins.  If instructed, eat more foods that contain a lot of potassium, such as: ? Nuts, such as peanuts and pistachios. ? Seeds, such as sunflower seeds and pumpkin seeds. ? Peas, lentils, and lima beans. ? Whole grain and bran cereals and breads. ? Fresh fruits and vegetables, such as apricots, avocado, bananas, cantaloupe, kiwi, oranges, tomatoes, asparagus, and potatoes. ? Orange juice. ? Tomato juice. ? Red meats. ? Yogurt.  Keep all follow-up visits as told by your health care provider. This is important. Contact a health care provider if:  You have weakness that gets worse.  You feel your heart pounding or racing.  You vomit.  You have diarrhea.  You have diabetes (diabetes mellitus) and you have trouble keeping your blood sugar (glucose) in your target range. Get help right away if:  You have chest pain.  You have shortness of breath.  You have vomiting or diarrhea that lasts for more than 2 days.  You faint. This information is not intended to replace advice given to you by your health care provider. Make sure you discuss any questions you have with your health care provider. Document Released: 03/16/2005 Document Revised: 11/02/2015 Document Reviewed: 11/02/2015 Elsevier Interactive Patient Education  2018 [employer]. Hypomagnesemia Hypomagnesemia is a condition in which the level of magnesium in the blood is low. Magnesium is a mineral that is found in many foods. It is used in many different processes in the body. Hypomagnesemia can affect every organ in the body. It can cause life-threatening problems. What  are the causes? Causes of hypomagnesemia include:  Not getting enough magnesium in your diet.  Malnutrition.  Problems with absorbing magnesium from the intestines.  Dehydration.  Alcohol abuse.  Vomiting.  Severe diarrhea.  Some  medicines, including medicines that make you urinate more.  Certain diseases, such as kidney disease, diabetes, and overactive thyroid.  What are the signs or symptoms?  Involuntary shaking or trembling of a body part (tremor).  Confusion.  Muscle weakness.  Sensitivity to light, sound, and touch.  Psychiatric issues, such as depression, irritability, or psychosis.  Sudden tightening of muscles (muscle spasms).  Tingling in the arms and legs.  A feeling of fluttering of the heart. These symptoms are more severe if magnesium levels drop suddenly. How is this diagnosed? To make a diagnosis, your health care provider will do a physical exam and order blood and urine tests. How is this treated? Treatment will depend on the cause and the severity of your condition. It may involve:  A magnesium supplement. This can be taken in pill form. It can also be given through an IV tube. This is usually done if the condition is severe.  Changes to your diet. You may be directed to eat foods that have a lot of magnesium, such as green leafy vegetables, peas, beans, and nuts.  Eliminating alcohol from your diet.  Follow these instructions at home:  Include foods with magnesium in your diet. Foods that are rich in magnesium include green vegetables, beans, nuts and seeds, and whole grains.  Take medicines only as directed by your health care provider.  Take magnesium supplements if your health care provider instructs you to do that. Take them as directed.  Have your magnesium levels monitored as directed by your health care provider.  When you are active, drink fluids that contain electrolytes.  Keep all follow-up visits as directed by your health care provider. This is important. Contact a health care provider if:  You get worse instead of better.  Your symptoms return. Get help right away if:  Your symptoms are severe. This information is not intended to replace advice given to  you by your health care provider. Make sure you discuss any questions you have with your health care provider. Document Released: 12/10/2004 Document Revised: 08/22/2015 Document Reviewed: 10/30/2013 Elsevier Interactive Patient Education  2018 [employer] Insulin Treatment for Diabetes Diabetes (diabetes mellitus) is a long-term (chronic) disease. It occurs when the body does not properly use sugar (glucose) that is released from food after digestion. Glucose levels are controlled by a hormone called insulin, which is made in the pancreas.  If you have type 1 diabetes, the pancreas does not make any insulin, so you must take insulin.  If you have type 2 diabetes, you might need to take insulin along with other medicines. In type 2 diabetes, one or both of these problems may be present: ? The pancreas does not make enough insulin. ? Cells in the body do not respond properly to insulin that the body makes (insulin resistance).  You must use insulin correctly to control your diabetes. You must have some insulin in your body at all times. Insulin treatment varies depending on your type of diabetes, your treatment goals, and your medical history. It is important for you to understand your insulin treatment plan so you can be an active partner in managing your diabetes. How is insulin given? Insulin can only be given through a shot (injection). It is injected using a syringe and  needle, an insulin pen, a pump, or a jet injector. Your health care provider will:  Prescribe the amount and type of insulin that you need.  Tell you when you should inject your insulin.  Where on the body should insulin be injected? Insulin is injected into a layer of fatty tissue under the skin. Good places to inject insulin include:  Abdomen. Generally, the abdomen is the best place to inject insulin. However, you should avoid any area that is less than 2 inches (5 cm) from the belly button (navel).  Front and outer  area of the upper thighs.  The back of the upper arms.  Upper buttocks.  It is important to:  Give your injection in a slightly different place each time. This helps to prevent irritation and improve absorption.  Avoid injecting into areas that have scar tissue.  Usually, you will give yourself insulin injections. Others can also be taught how to give you injections. You will use a special type of syringe that is made only for insulin. Some people may have an insulin pump that delivers insulin steadily through a tube (cannula) that is placed under the skin. What are the different types of insulin? The following information is a general guide to different types of insulin. Specifics vary depending on the insulin product that your health care provider prescribes.  Rapid-acting insulin: ? Starts working quickly, in as little as 5 minutes. ? Can last for 4-6 hours, or sometimes longer. ? Works well when taken right before a meal to quickly lower blood glucose.  Short-acting insulin: ? Starts working in about 30 minutes. ? Can last for 6-10 hours. ? Should be taken about 30 minutes before you start eating a meal.  Intermediate-acting insulin: ? Starts working in 1-2 hours. ? Lasts for about 10-18 hours. ? Lowers your blood glucose for a longer period of time but is not as effective for lowering blood glucose right after a meal.  Long-acting insulin: ? Mimics the small amount of insulin that your pancreas usually produces throughout the day. ? Should be used either one or two times a day. ? Is usually used in combination with other types of insulin or other medicines.  Concentrated insulin, or U-500 insulin: ? Contains a higher dose of insulin than most rapid-acting insulins. U-500 insulin has 5 times the amount of insulin per 1 mL. ? Should only be used with the special U-500 syringe or U-500 insulin pen. It is dangerous to use the wrong type of syringe with this insulin.  What are  the side effects of insulin? Possible side effects of insulin treatment include:  Low blood glucose (hypoglycemia).  Weight gain.  High blood glucose (hyperglycemia).  Skin injury or irritation.  Some of these side effects can be caused by using improper injection technique. It is important to learn to inject insulin properly. What are common terms associated with insulin treatment? Some terms that you might hear include:  Basal insulin, or basal rate. This is the constant amount of insulin that needs to be present in your body to stabilize your blood glucose levels. People who have type 1 diabetes need basal insulin in a steady (continuous) dose 24 hours a day. ? Usually, intermediate-acting or long-acting insulin is used one or two times a day to manage basal insulin levels. ? Medicines that are taken by mouth may also be recommended to manage basal insulin levels.  Prandial insulin. This refers to meal-related insulin. ? Blood glucose rises quickly after  a meal (postprandial). Rapid-acting or short-acting insulin can be used right before a meal (preprandial) to quickly lower blood glucose. ? You may be instructed to adjust the amount of prandial insulin that you take depending on how much carbohydrate (starch) is in your meal.  Corrective insulin. This may also be called a correction dose or supplemental dose. This is a small amount of rapid-acting or short-acting insulin that can be used to lower blood glucose if it is too high. You may be instructed to check your blood glucose at certain times of the day and use corrective insulin as needed.  Tight control, or intensive therapy. This means keeping your blood glucose as close to your target as possible, and preventing it from getting too high after meals. People who have tight control of their diabetes have fewer long-term problems caused by diabetes.  General instructions  Talk with your health care provider or pharmacist about the  type of insulin you should take and when you should take it. You should know when your insulin peaks and when it wears off. You need this information so you can plan your meals and exercise. You also need to work with your health care provider to:  Check your blood glucose every day. Your health care provider will tell you how often and when you should do this.  Manage your: ? Weight. ? Blood pressure. ? Cholesterol. ? Stress.  Eat a healthy diet.  Exercise regularly.  This information is not intended to replace advice given to you by your health care provider. Make sure you discuss any questions you have with your health care provider. Document Released: 06/12/2008 Document Revised: 08/22/2015 Document Reviewed: 04/19/2015 Elsevier Interactive Patient Education  2018 [employer] Carbohydrate Counting for Diabetes Mellitus, Adult Carbohydrate counting is a method for keeping track of how many carbohydrates you eat. Eating carbohydrates naturally increases the amount of sugar (glucose) in the blood. Counting how many carbohydrates you eat helps keep your blood glucose within normal limits, which helps you manage your diabetes (diabetes mellitus). It is important to know how many carbohydrates you can safely have in each meal. This is different for every person. A diet and nutrition specialist (registered dietitian) can help you make a meal plan and calculate how many carbohydrates you should have at each meal and snack. Carbohydrates are found in the following foods:  Grains, such as breads and cereals.  Dried beans and soy products.  Starchy vegetables, such as potatoes, peas, and corn.  Fruit and fruit juices.  Milk and yogurt.  Sweets and snack foods, such as cake, cookies, candy, chips, and soft drinks.  How do I count carbohydrates? There are two ways to count carbohydrates in food. You can use either of the methods or a combination of both. Reading "Nutrition Facts" on  packaged food The "Nutrition Facts" list is included on the labels of almost all packaged foods and beverages in the [place] It includes:  The serving size.  Information about nutrients in each serving, including the grams (g) of carbohydrate per serving.  To use the "Nutrition Facts":  Decide how many servings you will have.  Multiply the number of servings by the number of carbohydrates per serving.  The resulting number is the total amount of carbohydrates that you will be having.  Learning standard serving sizes of other foods When you eat foods containing carbohydrates that are not packaged or do not include "Nutrition Facts" on the label, you need to measure the servings  in order to count the amount of carbohydrates:  Measure the foods that you will eat with a food scale or measuring cup, if needed.  Decide how many standard-size servings you will eat.  Multiply the number of servings by 15. Most carbohydrate-rich foods have about 15 g of carbohydrates per serving. ? For example, if you eat 8 oz (170 g) of strawberries, you will have eaten 2 servings and 30 g of carbohydrates (2 servings x 15 g = 30 g).  For foods that have more than one food mixed, such as soups and casseroles, you must count the carbohydrates in each food that is included.  The following list contains standard serving sizes of common carbohydrate-rich foods. Each of these servings has about 15 g of carbohydrates:   hamburger bun or  English muffin.   oz (15 mL) syrup.   oz (14 g) jelly.  1 slice of bread.  1 six-inch tortilla.  3 oz (85 g) cooked rice or pasta.  4 oz (113 g) cooked dried beans.  4 oz (113 g) starchy vegetable, such as peas, corn, or potatoes.  4 oz (113 g) hot cereal.  4 oz (113 g) mashed potatoes or  of a large baked potato.  4 oz (113 g) canned or frozen fruit.  4 oz (120 mL) fruit juice.  4-6 crackers.  6 chicken nuggets.  6 oz (170 g) unsweetened dry cereal.  6  oz (170 g) plain fat-free yogurt or yogurt sweetened with artificial sweeteners.  8 oz (240 mL) milk.  8 oz (170 g) fresh fruit or one small piece of fruit.  24 oz (680 g) popped popcorn.  Example of carbohydrate counting Sample meal  3 oz (85 g) chicken breast.  6 oz (170 g) brown rice.  4 oz (113 g) corn.  8 oz (240 mL) milk.  8 oz (170 g) strawberries with sugar-free whipped topping. Carbohydrate calculation 1. Identify the foods that contain carbohydrates: ? Rice. ? Corn. ? Milk. ? Strawberries. 2. Calculate how many servings you have of each food: ? 2 servings rice. ? 1 serving corn. ? 1 serving milk. ? 1 serving strawberries. 3. Multiply each number of servings by 15 g: ? 2 servings rice x 15 g = 30 g. ? 1 serving corn x 15 g = 15 g. ? 1 serving milk x 15 g = 15 g. ? 1 serving strawberries x 15 g = 15 g. 4. Add together all of the amounts to find the total grams of carbohydrates eaten: ? 30 g + 15 g + 15 g + 15 g = 75 g of carbohydrates total. This information is not intended to replace advice given to you by your health care provider. Make sure you discuss any questions you have with your health care provider. Document Released: 03/16/2005 Document Revised: 10/04/2015 Document Reviewed: 08/28/2015 Elsevier Interactive Patient Education  2018 [employer] Major Depressive Disorder, Adult Major depressive disorder (MDD) is a mental health condition. It may also be called clinical depression or unipolar depression. MDD usually causes feelings of sadness, hopelessness, or helplessness. MDD can also cause physical symptoms. It can interfere with work, school, relationships, and other everyday activities. MDD may be mild, moderate, or severe. It may occur once (single episode major depressive disorder) or it may occur multiple times (recurrent major depressive disorder). What are the causes? The exact cause of this condition is not known. MDD is most likely caused by a  combination of things, which may include:  Genetic factors. These are traits that are passed along from parent to child.  Individual factors. Your personality, your behavior, and the way you handle your thoughts and feelings may contribute to MDD. This includes personality traits and behaviors learned from others.  Physical factors, such as: ? Differences in the part of your brain that controls emotion. This part of your brain may be different than it is in people who do not have MDD. ? Long-term (chronic) medical or psychiatric illnesses.  Social factors. Traumatic experiences or major life changes may play a role in the development of MDD.  What increases the risk? This condition is more likely to develop in women. The following factors may also make you more likely to develop MDD:  A family history of depression.  Troubled family relationships.  Abnormally low levels of certain brain chemicals.  Traumatic events in childhood, especially abuse or the loss of a parent.  Being under a lot of stress, or long-term stress, especially from upsetting life experiences or losses.  A history of: ? Chronic physical illness. ? Other mental health disorders. ? Substance abuse.  Poor living conditions.  Experiencing social exclusion or discrimination on a regular basis.  What are the signs or symptoms? The main symptoms of MDD typically include:  Constant depressed or irritable mood.  Loss of interest in things and activities.  MDD symptoms may also include:  Sleeping or eating too much or too little.  Unexplained weight change.  Fatigue or low energy.  Feelings of worthlessness or guilt.  Difficulty thinking clearly or making decisions.  Thoughts of suicide or of harming others.  Physical agitation or weakness.  Isolation.  Severe cases of MDD may also occur with other symptoms, such as:  Delusions or hallucinations, in which you imagine things that are not real  (psychotic depression).  Low-level depression that lasts at least a year (chronic depression or persistent depressive disorder).  Extreme sadness and hopelessness (melancholic depression).  Trouble speaking and moving (catatonic depression).  How is this diagnosed? This condition may be diagnosed based on:  Your symptoms.  Your medical history, including your mental health history. This may involve tests to evaluate your mental health. You may be asked questions about your lifestyle, including any drug and alcohol use, and how long you have had symptoms of MDD.  A physical exam.  Blood tests to rule out other conditions.  You must have a depressed mood and at least four other MDD symptoms most of the day, nearly every day in the same 2-week timeframe before your health care provider can confirm a diagnosis of MDD. How is this treated? This condition is usually treated by mental health professionals, such as psychologists, psychiatrists, and clinical social workers. You may need more than one type of treatment. Treatment may include:  Psychotherapy. This is also called talk therapy or counseling. Types of psychotherapy include: ? Cognitive behavioral therapy (CBT). This type of therapy teaches you to recognize unhealthy feelings, thoughts, and behaviors, and replace them with positive thoughts and actions. ? Interpersonal therapy (IPT). This helps you to improve the way you relate to and communicate with others. ? Family therapy. This treatment includes members of your family.  Medicine to treat anxiety and depression, or to help you control certain emotions and behaviors.  Lifestyle changes, such as: ? Limiting alcohol and drug use. ? Exercising regularly. ? Getting plenty of sleep. ? Making healthy eating choices. ? Spending more time outdoors.  Treatments involving stimulation of the  brain can be used in situations with extremely severe symptoms, or when medicine or other  therapies do not work over time. These treatments include electroconvulsive therapy, transcranial magnetic stimulation, and vagal nerve stimulation. Follow these instructions at home: Activity  Return to your normal activities as told by your health care provider.  Exercise regularly and spend time outdoors as told by your health care provider. General instructions  Take over-the-counter and prescription medicines only as told by your health care provider.  Do not drink alcohol. If you drink alcohol, limit your alcohol intake to no more than 1 drink a day for nonpregnant women and 2 drinks a day for men. One drink equals 12 oz of beer, 5 oz of wine, or 1 oz of hard liquor. Alcohol can affect any antidepressant medicines you are taking. Talk to your health care provider about your alcohol use.  Eat a healthy diet and get plenty of sleep.  Find activities that you enjoy doing, and make time to do them.  Consider joining a support group. Your health care provider may be able to recommend a support group.  Keep all follow-up visits as told by your health care provider. This is important. Where to find more information: [employer] on Mental Illness  www.nami.org  [place] of Mental Health  http://www.maynard.net/  National Suicide Prevention Lifeline  1-800-273-TALK 863-262-2495). This is free, 24-hour help.  Contact a health care provider if:  Your symptoms get worse.  You develop new symptoms. Get help right away if:  You self-harm.  You have serious thoughts about hurting yourself or others.  You see, hear, taste, smell, or feel things that are not present (hallucinate). This information is not intended to replace advice given to you by your health care provider. Make sure you discuss any questions you have with your health care provider. Document Released: 07/11/2012 Document Revised: 11/21/2015 Document Reviewed: 09/25/2015 Elsevier Interactive Patient  Education  2017 [employer].   Food Choices to Help Relieve Diarrhea, Adult When you have diarrhea, the foods you eat and your eating habits are very important. Choosing the right foods and drinks can help:  Relieve diarrhea.  Replace lost fluids and nutrients.  Prevent dehydration.  What general guidelines should I follow? Relieving diarrhea  Choose foods with less than 2 g or .07 oz. of fiber per serving.  Limit fats to less than 8 tsp (38 g or 1.34 oz.) a day.  Avoid the following: ? Foods and beverages sweetened with high-fructose corn syrup, honey, or sugar alcohols such as xylitol, sorbitol, and mannitol. ? Foods that contain a lot of fat or sugar. ? Fried, greasy, or spicy foods. ? High-fiber grains, breads, and cereals. ? Raw fruits and vegetables.  Eat foods that are rich in probiotics. These foods include dairy products such as yogurt and fermented milk products. They help increase healthy bacteria in the stomach and intestines (gastrointestinal tract, or GI tract).  If you have lactose intolerance, avoid dairy products. These may make your diarrhea worse.  Take medicine to help stop diarrhea (antidiarrheal medicine) only as told by your health care provider. Replacing nutrients  Eat small meals or snacks every 3-4 hours.  Eat bland foods, such as white rice, toast, or baked potato, until your diarrhea starts to get better. Gradually reintroduce nutrient-rich foods as tolerated or as told by your health care provider. This includes: ? Well-cooked protein foods. ? Peeled, seeded, and soft-cooked fruits and vegetables. ? Low-fat dairy products.  Take  vitamin and mineral supplements as told by your health care provider. Preventing dehydration   Start by sipping water or a special solution to prevent dehydration (oral rehydration solution, ORS). Urine that is clear or pale yellow means that you are getting enough fluid.  Try to drink at least 8-10 cups of fluid  each day to help replace lost fluids.  You may add other liquids in addition to water, such as clear juice or decaffeinated sports drinks, as tolerated or as told by your health care provider.  Avoid drinks with caffeine, such as coffee, tea, or soft drinks.  Avoid alcohol. What foods are recommended? The items listed may not be a complete list. Talk with your health care provider about what dietary choices are best for you. Grains White rice. White, [place], or pita breads (fresh or toasted), including plain rolls, buns, or bagels. White pasta. Saltine, soda, or graham crackers. Pretzels. Low-fiber cereal. Cooked cereals made with water (such as cornmeal, farina, or cream cereals). Plain muffins. Matzo. Melba toast. Zwieback. Vegetables Potatoes (without the skin). Most well-cooked and canned vegetables without skins or seeds. Tender lettuce. Fruits Apple sauce. Fruits canned in juice. Cooked apricots, cherries, grapefruit, peaches, pears, or plums. Fresh bananas and cantaloupe. Meats and other protein foods Baked or boiled chicken. Eggs. Tofu. Fish. Seafood. Smooth nut butters. Ground or well-cooked tender beef, ham, veal, lamb, pork, or poultry. Dairy Plain yogurt, kefir, and unsweetened liquid yogurt. Lactose-free milk, buttermilk, skim milk, or soy milk. Low-fat or nonfat hard cheese. Beverages Water. Low-calorie sports drinks. Fruit juices without pulp. Strained tomato and vegetable juices. Decaffeinated teas. Sugar-free beverages not sweetened with sugar alcohols. Oral rehydration solutions, if approved by your health care provider. Seasoning and other foods Bouillon, broth, or soups made from recommended foods. What foods are not recommended? The items listed may not be a complete list. Talk with your health care provider about what dietary choices are best for you. Grains Whole grain, whole wheat, bran, or rye breads, rolls, pastas, and crackers. Wild or brown rice. Whole grain or  bran cereals. Barley. Oats and oatmeal. Corn tortillas or taco shells. Granola. Popcorn. Vegetables Raw vegetables. Fried vegetables. Cabbage, broccoli, Brussels sprouts, artichokes, baked beans, beet greens, corn, kale, legumes, peas, sweet potatoes, and yams. Potato skins. Cooked spinach and cabbage. Fruits Dried fruit, including raisins and dates. Raw fruits. Stewed or dried prunes. Canned fruits with syrup. Meat and other protein foods Fried or fatty meats. Deli meats. Chunky nut butters. Nuts and seeds. Beans and lentils. Tomasa Blase. Hot dogs. Sausage. Dairy High-fat cheeses. Whole milk, chocolate milk, and beverages made with milk, such as milk shakes. Half-and-half. Cream. sour cream. Ice cream. Beverages Caffeinated beverages (such as coffee, tea, soda, or energy drinks). Alcoholic beverages. Fruit juices with pulp. Prune juice. Soft drinks sweetened with high-fructose corn syrup or sugar alcohols. High-calorie sports drinks. Fats and oils Butter. Cream sauces. Margarine. Salad oils. Plain salad dressings. Olives. Avocados. Mayonnaise. Sweets and desserts Sweet rolls, doughnuts, and sweet breads. Sugar-free desserts sweetened with sugar alcohols such as xylitol and sorbitol. Seasoning and other foods Honey. Hot sauce. Chili powder. Gravy. Cream-based or milk-based soups. Pancakes and waffles. Summary  When you have diarrhea, the foods you eat and your eating habits are very important.  Make sure you get at least 8-10 cups of fluid each day, or enough to keep your urine clear or pale yellow.  Eat bland foods and gradually reintroduce healthy, nutrient-rich foods as tolerated, or as told by your health  care provider.  Avoid high-fiber, fried, greasy, or spicy foods. This information is not intended to replace advice given to you by your health care provider. Make sure you discuss any questions you have with your health care provider. Document Released: 06/06/2003 Document Revised:  03/13/2016 Document Reviewed: 03/13/2016 Elsevier Interactive Patient Education  2017 [employer].

## 2017-02-11 ENCOUNTER — Encounter (HOSPITAL_COMMUNITY): Payer: Self-pay | Admitting: General Practice

## 2017-02-11 ENCOUNTER — Inpatient Hospital Stay (HOSPITAL_COMMUNITY)
Admission: EM | Admit: 2017-02-11 | Discharge: 2017-02-14 | DRG: 637 | Disposition: A | Discharge status: Home / Self Care | Payer: No Typology Code available for payment source | Attending: Internal Medicine | Admitting: Internal Medicine

## 2017-02-11 ENCOUNTER — Other Ambulatory Visit: Payer: Self-pay

## 2017-02-11 ENCOUNTER — Telehealth: Payer: Self-pay | Admitting: *Deleted

## 2017-02-11 DIAGNOSIS — Z9111 Patient's noncompliance with dietary regimen: Secondary | ICD-10-CM | POA: Diagnosis not present

## 2017-02-11 DIAGNOSIS — F332 Major depressive disorder, recurrent severe without psychotic features: Secondary | ICD-10-CM | POA: Diagnosis not present

## 2017-02-11 DIAGNOSIS — R739 Hyperglycemia, unspecified: Secondary | ICD-10-CM | POA: Diagnosis not present

## 2017-02-11 DIAGNOSIS — F418 Other specified anxiety disorders: Secondary | ICD-10-CM | POA: Diagnosis present

## 2017-02-11 DIAGNOSIS — E43 Unspecified severe protein-calorie malnutrition: Secondary | ICD-10-CM | POA: Diagnosis present

## 2017-02-11 DIAGNOSIS — F419 Anxiety disorder, unspecified: Secondary | ICD-10-CM | POA: Diagnosis present

## 2017-02-11 DIAGNOSIS — IMO0002 Reserved for concepts with insufficient information to code with codable children: Secondary | ICD-10-CM

## 2017-02-11 DIAGNOSIS — G4700 Insomnia, unspecified: Secondary | ICD-10-CM | POA: Diagnosis present

## 2017-02-11 DIAGNOSIS — M549 Dorsalgia, unspecified: Secondary | ICD-10-CM

## 2017-02-11 DIAGNOSIS — E1110 Type 2 diabetes mellitus with ketoacidosis without coma: Secondary | ICD-10-CM | POA: Diagnosis present

## 2017-02-11 DIAGNOSIS — G4733 Obstructive sleep apnea (adult) (pediatric): Secondary | ICD-10-CM | POA: Diagnosis present

## 2017-02-11 DIAGNOSIS — Z79899 Other long term (current) drug therapy: Secondary | ICD-10-CM | POA: Diagnosis not present

## 2017-02-11 DIAGNOSIS — E1142 Type 2 diabetes mellitus with diabetic polyneuropathy: Secondary | ICD-10-CM | POA: Diagnosis present

## 2017-02-11 DIAGNOSIS — E1165 Type 2 diabetes mellitus with hyperglycemia: Secondary | ICD-10-CM

## 2017-02-11 DIAGNOSIS — F321 Major depressive disorder, single episode, moderate: Secondary | ICD-10-CM | POA: Diagnosis present

## 2017-02-11 DIAGNOSIS — I10 Essential (primary) hypertension: Secondary | ICD-10-CM | POA: Diagnosis present

## 2017-02-11 DIAGNOSIS — K219 Gastro-esophageal reflux disease without esophagitis: Secondary | ICD-10-CM | POA: Diagnosis present

## 2017-02-11 DIAGNOSIS — Z681 Body mass index (BMI) 19 or less, adult: Secondary | ICD-10-CM | POA: Diagnosis not present

## 2017-02-11 DIAGNOSIS — F1020 Alcohol dependence, uncomplicated: Secondary | ICD-10-CM | POA: Diagnosis present

## 2017-02-11 DIAGNOSIS — E876 Hypokalemia: Secondary | ICD-10-CM | POA: Diagnosis present

## 2017-02-11 DIAGNOSIS — Z794 Long term (current) use of insulin: Secondary | ICD-10-CM | POA: Diagnosis not present

## 2017-02-11 DIAGNOSIS — E7800 Pure hypercholesterolemia, unspecified: Secondary | ICD-10-CM | POA: Diagnosis present

## 2017-02-11 DIAGNOSIS — G8929 Other chronic pain: Secondary | ICD-10-CM | POA: Diagnosis present

## 2017-02-11 DIAGNOSIS — F17210 Nicotine dependence, cigarettes, uncomplicated: Secondary | ICD-10-CM | POA: Diagnosis not present

## 2017-02-11 DIAGNOSIS — E039 Hypothyroidism, unspecified: Secondary | ICD-10-CM | POA: Diagnosis present

## 2017-02-11 DIAGNOSIS — R197 Diarrhea, unspecified: Secondary | ICD-10-CM | POA: Diagnosis present

## 2017-02-11 DIAGNOSIS — Z9114 Patient's other noncompliance with medication regimen: Secondary | ICD-10-CM

## 2017-02-11 HISTORY — DX: Pure hypercholesterolemia, unspecified: E78.00

## 2017-02-11 HISTORY — DX: Essential (primary) hypertension: I10

## 2017-02-11 HISTORY — DX: Major depressive disorder, single episode, unspecified: F32.9

## 2017-02-11 HISTORY — DX: Depression, unspecified: F32.A

## 2017-02-11 HISTORY — DX: Gastro-esophageal reflux disease without esophagitis: K21.9

## 2017-02-11 HISTORY — DX: Pneumonia, unspecified organism: J18.9

## 2017-02-11 HISTORY — DX: Bipolar disorder, unspecified: F31.9

## 2017-02-11 HISTORY — DX: Type 2 diabetes mellitus without complications: E11.9

## 2017-02-11 HISTORY — DX: Personal history of urinary calculi: Z87.442

## 2017-02-11 HISTORY — DX: Anxiety disorder, unspecified: F41.9

## 2017-02-11 LAB — URINALYSIS, ROUTINE W REFLEX MICROSCOPIC
Bacteria, UA: NONE SEEN
Bilirubin Urine: NEGATIVE
Glucose, UA: >=500 mg/dL — AB
KETONES UR: 5 mg/dL — AB
Leukocytes, UA: NEGATIVE
Nitrite: NEGATIVE
PROTEIN: NEGATIVE mg/dL
SQUAMOUS EPITHELIAL / LPF: NONE SEEN
Specific Gravity, Urine: 1.023 (ref 1.005–1.030)
pH: 6 (ref 5.0–8.0)

## 2017-02-11 LAB — COMPREHENSIVE METABOLIC PANEL
ALK PHOS: 203 U/L — AB (ref 38–126)
ALT: 66 U/L — ABNORMAL HIGH (ref 17–63)
ANION GAP: 24 — AB (ref 5–15)
AST: 134 U/L — ABNORMAL HIGH (ref 15–41)
Albumin: 4 g/dL (ref 3.5–5.0)
BILIRUBIN TOTAL: 1.2 mg/dL (ref 0.3–1.2)
CALCIUM: 8.7 mg/dL — AB (ref 8.9–10.3)
CO2: 26 mmol/L (ref 22–32)
Chloride: 82 mmol/L — ABNORMAL LOW (ref 101–111)
Creatinine, Ser: 0.76 mg/dL (ref 0.61–1.24)
GFR calc Af Amer: >60 mL/min (ref >60)
Glucose, Bld: 753 mg/dL (ref 65–99)
POTASSIUM: 2.8 mmol/L — AB (ref 3.5–5.1)
Sodium: 132 mmol/L — ABNORMAL LOW (ref 135–145)
TOTAL PROTEIN: 6.5 g/dL (ref 6.5–8.1)

## 2017-02-11 LAB — BASIC METABOLIC PANEL
ANION GAP: 19 — AB (ref 5–15)
ANION GAP: 15 (ref 5–15)
ANION GAP: 10 (ref 5–15)
BUN: <5 mg/dL — ABNORMAL LOW (ref 6–20)
BUN: <5 mg/dL — ABNORMAL LOW (ref 6–20)
BUN: <5 mg/dL — ABNORMAL LOW (ref 6–20)
CALCIUM: 7.7 mg/dL — AB (ref 8.9–10.3)
CHLORIDE: 89 mmol/L — AB (ref 101–111)
CHLORIDE: 91 mmol/L — AB (ref 101–111)
CO2: 27 mmol/L (ref 22–32)
CO2: 28 mmol/L (ref 22–32)
CO2: 33 mmol/L — ABNORMAL HIGH (ref 22–32)
Calcium: 7.9 mg/dL — ABNORMAL LOW (ref 8.9–10.3)
Calcium: 7.8 mg/dL — ABNORMAL LOW (ref 8.9–10.3)
Chloride: 92 mmol/L — ABNORMAL LOW (ref 101–111)
Creatinine, Ser: 0.64 mg/dL (ref 0.61–1.24)
Creatinine, Ser: 0.54 mg/dL — ABNORMAL LOW (ref 0.61–1.24)
Creatinine, Ser: 0.5 mg/dL — ABNORMAL LOW (ref 0.61–1.24)
GFR calc Af Amer: >60 mL/min (ref >60)
GFR calc non Af Amer: >60 mL/min (ref >60)
Glucose, Bld: 487 mg/dL — ABNORMAL HIGH (ref 65–99)
Glucose, Bld: 279 mg/dL — ABNORMAL HIGH (ref 65–99)
Glucose, Bld: 114 mg/dL — ABNORMAL HIGH (ref 65–99)
POTASSIUM: 3.2 mmol/L — AB (ref 3.5–5.1)
POTASSIUM: 2.9 mmol/L — AB (ref 3.5–5.1)
POTASSIUM: 3 mmol/L — AB (ref 3.5–5.1)
SODIUM: 135 mmol/L (ref 135–145)
SODIUM: 134 mmol/L — AB (ref 135–145)
SODIUM: 135 mmol/L (ref 135–145)

## 2017-02-11 LAB — I-STAT CHEM 8, ED
BUN: <3 mg/dL — ABNORMAL LOW (ref 6–20)
CREATININE: 1 mg/dL (ref 0.61–1.24)
Calcium, Ion: 1.05 mmol/L — ABNORMAL LOW (ref 1.15–1.40)
Chloride: 81 mmol/L — ABNORMAL LOW (ref 101–111)
HCT: 38 % — ABNORMAL LOW (ref 39.0–52.0)
HEMOGLOBIN: 12.9 g/dL — AB (ref 13.0–17.0)
POTASSIUM: 2.7 mmol/L — AB (ref 3.5–5.1)
SODIUM: 132 mmol/L — AB (ref 135–145)
TCO2: 29 mmol/L (ref 22–32)

## 2017-02-11 LAB — CBC
HCT: 37.1 % — ABNORMAL LOW (ref 39.0–52.0)
HEMOGLOBIN: 12.9 g/dL — AB (ref 13.0–17.0)
MCH: 32 pg (ref 26.0–34.0)
MCHC: 34.8 g/dL (ref 30.0–36.0)
MCV: 92.1 fL (ref 78.0–100.0)
PLATELETS: 106 10*3/uL — AB (ref 150–400)
RBC: 4.03 MIL/uL — AB (ref 4.22–5.81)
RDW: 13.8 % (ref 11.5–15.5)
WBC: 3.7 10*3/uL — AB (ref 4.0–10.5)

## 2017-02-11 LAB — CBG MONITORING, ED
GLUCOSE-CAPILLARY: 420 mg/dL — AB (ref 65–99)
GLUCOSE-CAPILLARY: 342 mg/dL — AB (ref 65–99)
GLUCOSE-CAPILLARY: 179 mg/dL — AB (ref 65–99)
Glucose-Capillary: 483 mg/dL — ABNORMAL HIGH (ref 65–99)
Glucose-Capillary: 306 mg/dL — ABNORMAL HIGH (ref 65–99)
Glucose-Capillary: 240 mg/dL — ABNORMAL HIGH (ref 65–99)
Glucose-Capillary: 208 mg/dL — ABNORMAL HIGH (ref 65–99)

## 2017-02-11 LAB — I-STAT VENOUS BLOOD GAS, ED
Acid-Base Excess: 7 mmol/L — ABNORMAL HIGH (ref 0.0–2.0)
Bicarbonate: 32.1 mmol/L — ABNORMAL HIGH (ref 20.0–28.0)
O2 Saturation: 74 %
TCO2: 34 mmol/L — ABNORMAL HIGH (ref 22–32)
pCO2, Ven: 47.3 mmHg (ref 44.0–60.0)
pH, Ven: 7.44 — ABNORMAL HIGH (ref 7.250–7.430)
pO2, Ven: 39 mmHg (ref 32.0–45.0)

## 2017-02-11 LAB — GLUCOSE, CAPILLARY
GLUCOSE-CAPILLARY: 123 mg/dL — AB (ref 65–99)
GLUCOSE-CAPILLARY: 136 mg/dL — AB (ref 65–99)
GLUCOSE-CAPILLARY: 163 mg/dL — AB (ref 65–99)
Glucose-Capillary: 189 mg/dL — ABNORMAL HIGH (ref 65–99)

## 2017-02-11 LAB — MAGNESIUM: Magnesium: 1.6 mg/dL — ABNORMAL LOW (ref 1.7–2.4)

## 2017-02-11 MED ORDER — SODIUM CHLORIDE 0.9 % IV SOLN: INTRAVENOUS | Status: DC

## 2017-02-11 MED ORDER — LAMOTRIGINE 25 MG PO TABS
25.0000 mg | ORAL_TABLET | Freq: Every day | ORAL | Status: DC
  Administered 2017-02-11 – 2017-02-14 (×4): 25 mg via ORAL
  Filled 2017-02-11 (×4): qty 1

## 2017-02-11 MED ORDER — FENOFIBRATE 160 MG PO TABS
160.0000 mg | ORAL_TABLET | Freq: Every day | ORAL | Status: DC
  Administered 2017-02-11 – 2017-02-14 (×4): 160 mg via ORAL
  Filled 2017-02-11 (×4): qty 1

## 2017-02-11 MED ORDER — INSULIN ASPART 100 UNIT/ML ~~LOC~~ SOLN: 0.0000 [IU] | Freq: Every day | SUBCUTANEOUS | Status: DC

## 2017-02-11 MED ORDER — POTASSIUM CHLORIDE 10 MEQ/100ML IV SOLN
10.0000 meq | INTRAVENOUS | Status: AC
  Administered 2017-02-11: 10 meq via INTRAVENOUS
  Filled 2017-02-11: qty 100

## 2017-02-11 MED ORDER — LACTATED RINGERS IV BOLUS (SEPSIS)
1000.0000 mL | Freq: Once | INTRAVENOUS | Status: AC
  Administered 2017-02-11: 1000 mL via INTRAVENOUS

## 2017-02-11 MED ORDER — DEXTROSE-NACL 5-0.45 % IV SOLN
INTRAVENOUS | Status: DC
  Administered 2017-02-11: 17:00:00 via INTRAVENOUS

## 2017-02-11 MED ORDER — POTASSIUM CHLORIDE CRYS ER 20 MEQ PO TBCR
40.0000 meq | EXTENDED_RELEASE_TABLET | Freq: Once | ORAL | Status: AC
  Administered 2017-02-11: 40 meq via ORAL
  Filled 2017-02-11: qty 2

## 2017-02-11 MED ORDER — ATORVASTATIN CALCIUM 10 MG PO TABS
10.0000 mg | ORAL_TABLET | Freq: Every day | ORAL | Status: DC
  Administered 2017-02-11 – 2017-02-13 (×3): 10 mg via ORAL
  Filled 2017-02-11 (×3): qty 1

## 2017-02-11 MED ORDER — INSULIN ASPART 100 UNIT/ML ~~LOC~~ SOLN
0.0000 [IU] | Freq: Three times a day (TID) | SUBCUTANEOUS | Status: DC
  Administered 2017-02-12 (×2): 3 [IU] via SUBCUTANEOUS

## 2017-02-11 MED ORDER — SODIUM CHLORIDE 0.9 % IV SOLN
Freq: Once | INTRAVENOUS | Status: AC
  Administered 2017-02-11: 10:00:00 via INTRAVENOUS

## 2017-02-11 MED ORDER — DEXTROSE-NACL 5-0.45 % IV SOLN: INTRAVENOUS | Status: DC

## 2017-02-11 MED ORDER — ENSURE ENLIVE PO LIQD
237.0000 mL | Freq: Two times a day (BID) | ORAL | Status: DC
  Administered 2017-02-12: 237 mL via ORAL

## 2017-02-11 MED ORDER — METHOCARBAMOL 500 MG PO TABS
500.0000 mg | ORAL_TABLET | Freq: Three times a day (TID) | ORAL | Status: DC | PRN
  Administered 2017-02-11: 500 mg via ORAL
  Filled 2017-02-11: qty 1

## 2017-02-11 MED ORDER — POTASSIUM CHLORIDE 10 MEQ/100ML IV SOLN
10.0000 meq | INTRAVENOUS | Status: AC
Start: 2017-02-11 — End: 2017-02-11
  Administered 2017-02-11 (×4): 10 meq via INTRAVENOUS
  Filled 2017-02-11 (×3): qty 100

## 2017-02-11 MED ORDER — NABUMETONE 500 MG PO TABS
500.0000 mg | ORAL_TABLET | Freq: Two times a day (BID) | ORAL | Status: DC
  Administered 2017-02-11 – 2017-02-14 (×6): 500 mg via ORAL
  Filled 2017-02-11 (×7): qty 1

## 2017-02-11 MED ORDER — CARVEDILOL 12.5 MG PO TABS
12.5000 mg | ORAL_TABLET | Freq: Two times a day (BID) | ORAL | Status: DC
  Administered 2017-02-11 – 2017-02-14 (×6): 12.5 mg via ORAL
  Filled 2017-02-11 (×7): qty 1

## 2017-02-11 MED ORDER — DULOXETINE HCL 30 MG PO CPEP
30.0000 mg | ORAL_CAPSULE | Freq: Two times a day (BID) | ORAL | Status: DC
  Administered 2017-02-11 – 2017-02-12 (×3): 30 mg via ORAL
  Filled 2017-02-11 (×4): qty 1

## 2017-02-11 MED ORDER — GABAPENTIN 600 MG PO TABS
600.0000 mg | ORAL_TABLET | Freq: Three times a day (TID) | ORAL | Status: DC
  Administered 2017-02-11 – 2017-02-14 (×9): 600 mg via ORAL
  Filled 2017-02-11 (×11): qty 1

## 2017-02-11 MED ORDER — SODIUM CHLORIDE 0.9 % IV BOLUS (SEPSIS)
1000.0000 mL | Freq: Once | INTRAVENOUS | Status: AC
  Administered 2017-02-11: 1000 mL via INTRAVENOUS

## 2017-02-11 MED ORDER — ONDANSETRON HCL 4 MG/2ML IJ SOLN
4.0000 mg | Freq: Four times a day (QID) | INTRAMUSCULAR | Status: DC | PRN
  Administered 2017-02-11: 4 mg via INTRAVENOUS
  Filled 2017-02-11: qty 2

## 2017-02-11 MED ORDER — MAGNESIUM SULFATE 2 GM/50ML IV SOLN
2.0000 g | Freq: Once | INTRAVENOUS | Status: AC
  Administered 2017-02-11: 2 g via INTRAVENOUS
  Filled 2017-02-11: qty 50

## 2017-02-11 MED ORDER — SODIUM CHLORIDE 0.9 % IV SOLN
INTRAVENOUS | Status: DC
  Administered 2017-02-11: 14:00:00 via INTRAVENOUS

## 2017-02-11 MED ORDER — ARIPIPRAZOLE 5 MG PO TABS
5.0000 mg | ORAL_TABLET | Freq: Every day | ORAL | Status: DC
  Administered 2017-02-12 – 2017-02-14 (×3): 5 mg via ORAL
  Filled 2017-02-11 (×3): qty 1

## 2017-02-11 MED ORDER — INSULIN GLARGINE 100 UNIT/ML ~~LOC~~ SOLN
22.0000 [IU] | Freq: Every day | SUBCUTANEOUS | Status: DC
  Administered 2017-02-11: 22 [IU] via SUBCUTANEOUS
  Filled 2017-02-11 (×2): qty 0.22

## 2017-02-11 MED ORDER — CLONAZEPAM 0.5 MG PO TABS: 1.0000 mg | ORAL_TABLET | Freq: Two times a day (BID) | ORAL | Status: DC | PRN

## 2017-02-11 MED ORDER — ENOXAPARIN SODIUM 40 MG/0.4ML ~~LOC~~ SOLN
40.0000 mg | SUBCUTANEOUS | Status: DC
  Administered 2017-02-11 – 2017-02-13 (×3): 40 mg via SUBCUTANEOUS
  Filled 2017-02-11 (×3): qty 0.4

## 2017-02-11 MED ORDER — INSULIN REGULAR HUMAN 100 UNIT/ML IJ SOLN
INTRAMUSCULAR | Status: DC
  Administered 2017-02-11: 4.2 [IU]/h via INTRAVENOUS
  Filled 2017-02-11: qty 1

## 2017-02-11 NOTE — ED Notes (Signed)
Upon rounding, meal tray at pt bedside. Pt given meal tray by EVS before this RN aware. Pt only ate approx a few bites of chicken and a few sips of milk. This RN explained NPO status to pt, pt verbalized understanding. Meal tray placed at nursing station

## 2017-02-11 NOTE — ED Triage Notes (Signed)
Pt brought in by EMS who stated Pt CBG reading was "high". EMS called because Pt could not walk this AM. Upon arrival EMS stated Pt was "surrounded by candy wrappers". Pt stated that he was in the hospital "last week and I am here today to just be fixed up"

## 2017-02-11 NOTE — ED Notes (Signed)
[facility] gas po2-39, K-2.7, glucose-greater than 700

## 2017-02-11 NOTE — ED Notes (Signed)
Admitting paged regarding pt potassium

## 2017-02-11 NOTE — ED Notes (Addendum)
Meal tray ordered for pt to have on hand tonight to heat up when pt has diet ordered. Will leave by nursing station

## 2017-02-11 NOTE — ED Notes (Signed)
Update given to Shanda BumpsJessica, RN on 6E. Pt on the way upstairs at this time

## 2017-02-11 NOTE — Telephone Encounter (Signed)
Noted in Epic patient admitted to hospital will continue to follow disposition to ensure post hospitalization follow up appt scheduled.  Discussed with HR rep patient was admitted and would not be returning to work today or tomorrow.  Terry Schultz verbalized understanding information and had no further questions at this time.

## 2017-02-11 NOTE — Telephone Encounter (Addendum)
RN arrived to office and was informed that pt was transported by EMS a few minutes prior to my arrival this am. HR supervisor reports pt stated he fell in the parking lot walking in, and was feeling dizzy and that his sugar was high. Told ems he fell at home and then drove to work. Safety supervisor called ems. Unsure of any injuries r/t fall(s). Did not report any specifics to HR or safety supervisor that I was informed of.  Reviewed pt's chart for pcp recommendations as RN had scheduled f/u for him for yesterday afternoon. No records found in epic. PCP office called directly. They report he no-showed for appt yesterday. HR reports he had asked off of work to attend appt and did leave work early yesterday. Told ems and HR that he saw pcp yesterday, though he did not.   Unsure what pt's cbg is as he never reported a specific number to anyone this am prior to my arrival and his Freestyle Libre patch was not going to be up to temp until 6pm Tuesday. RN off site Wed. Pt was to f/u in clinic with NP this am. Attempted to call pt for more details regarding this am's situation, but no answer. Will f/u in epic for plan of care in ED.   Additionally, noted via epic that pt has been dismissed from [facility] patch study as he did not respond to any attempts to f/u via phone from 11/2-11/12. RN asked inpt DM coordinator to try to contact pt again as he said he would answer and clear out VMs to leave a mesg, but corrdinator was never able to and he did not return first 2 messages that were left.

## 2017-02-11 NOTE — ED Provider Notes (Signed)
[facility] EMERGENCY DEPARTMENT Provider Note   CSN: [id number] Arrival date & time: 02/11/17  1540     History   Chief Complaint Chief Complaint  Patient presents with  . Hyperglycemia    HPI Terry Schultz is a 46 y.o. male.  HPI 46 year old male with history of diabetes, pancreatitis and alcoholism presents to the ED for evaluation of hyperglycemia, nausea.  The patient is coming in by EMS where when they arrived patient was "surrounded by candy wrappers".  Patient states that he has not been taking his insulin.  When asked patient states that it is because "he is just lazy."  Patient reports nausea for the past 2 days.  Also reports generalized abdominal pain.  On past review patient's chart patient is admitted almost every month for DKA and hyperglycemia.  Patient is very noncompliant with medication.  Pt denies any fever, chill, ha, vision changes, lightheadedness, dizziness, congestion, neck pain, cp, sob, cough, urinary symptoms, change in bowel habits, melena, hematochezia, lower extremity paresthesias.  Past Medical History:  Diagnosis Date  . Alcoholism ([place])   . Diabetes mellitus without complication ([place])   . Pancreatitis     Patient Active Problem List   Diagnosis Date Noted  . Hyperglycemia 02/11/2017  . Hypokalemia 01/21/2017  . Diarrhea 01/21/2017  . Pressure injury of skin 01/20/2017  . Protein-calorie malnutrition, severe 01/20/2017  . DKA (diabetic ketoacidoses) ([place]) 12/05/2016  . Depression with anxiety 12/05/2016  . HLD (hyperlipidemia) 12/05/2016  . Chronic back pain 12/05/2016  . Alcoholism ([place])   . Hypomagnesemia 05/29/2016  . Cervical myelopathy ([place]) 05/28/2016  . Ambulatory dysfunction 05/27/2016  . Hyperglycemia, unspecified 05/27/2016  . Numbness and tingling of both legs 05/27/2016  . Chronic pain of both knees 05/21/2016  . Diabetic peripheral neuropathy ([place]) 05/21/2016  . Moderate episode of recurrent major  depressive disorder ([place]) 05/22/2015  . Panic disorder 05/22/2015  . Hepatic steatosis 11/21/2013  . Nondependent alcohol abuse, in remission 09/30/2013  . Allergic rhinitis 09/30/2013  . Anxiety disorder 09/30/2013  . Chronic cholecystitis 09/30/2013  . Disorder of magnesium metabolism 09/30/2013  . Essential hypertension 09/30/2013  . Familial hypertriglyceridemia 09/30/2013  . Hypercholesterolemia 09/30/2013  . Hypothyroidism 09/30/2013  . Insomnia 09/30/2013  . Low testosterone 09/30/2013  . OSA (obstructive sleep apnea) 09/30/2013  . Pancreatic pseudocyst 09/30/2013  . Depressive disorder 09/30/2013  . Recurrent pancreatitis ([place]) 09/30/2013  . Uncontrolled type 2 diabetes mellitus with diabetic polyneuropathy, with long-term current use of insulin ([place]) 09/30/2013  . Vitamin D deficiency 09/30/2013    Past Surgical History:  Procedure Laterality Date  . CHOLECYSTECTOMY         Home Medications    Prior to Admission medications   Medication Sig Start Date End Date Taking? Authorizing Provider  ARIPiprazole (ABILIFY) 5 MG tablet Take 5 mg by mouth daily.   Yes [provider]  atorvastatin (LIPITOR) 10 MG tablet Take 10 mg daily by mouth. 08/17/16  Yes [provider]  carvedilol (COREG) 12.5 MG tablet Take 12.5 mg by mouth 2 (two) times daily. 07/11/15  Yes [provider]  clonazePAM (KLONOPIN) 1 MG tablet Take 1 mg 2 (two) times daily as needed by mouth. 12/30/15  Yes [provider]  fenofibrate 160 MG tablet Take 160 mg by mouth. 06/04/16  Yes [provider]  hydrOXYzine (VISTARIL) 50 MG capsule Take 50 mg 2 (two) times daily as needed by mouth. 07/11/15  Yes [provider]  Insulin Glargine (  LANTUS SOLOSTAR) 100 UNIT/ML Solostar Pen Inject 22 Units into the skin daily. 01/21/17  Yes Dessa Phi, DO  insulin lispro (HUMALOG) 100 UNIT/ML injection Inject 0-0.1 mLs (0-10 Units total) into the skin 3 (three) times  daily before meals. Per sliding scale Patient taking differently: Inject 24 Units 2 (two) times daily at 10 AM and 5 PM into the skin. Per sliding scale  01/21/17  Yes Dessa Phi, DO  loperamide (IMODIUM) 2 MG capsule Take 1 capsule (2 mg total) by mouth as needed for diarrhea or loose stools. 01/21/17  Yes Dessa Phi, DO  methocarbamol (ROBAXIN) 500 MG tablet Take 500 mg by mouth 2 (two) times daily as needed. 11/09/16  Yes [provider]  nabumetone (RELAFEN) 500 MG tablet Take 500 mg by mouth 2 (two) times daily.   Yes [provider]  BAYER MICROLET LANCETS lancets 1 each by Other route 4 (four) times daily. Puncture skin 01/21/17   Dessa Phi, DO  blood glucose meter kit and supplies Dispense based on patient and insurance preference. Use up to four times daily as directed. (FOR ICD-9 250.00, 250.01). 01/21/17   Dessa Phi, DO  Continuous Blood Gluc Sensor MISC 1 each as directed by Does not apply route. Use as directed every 10 days. May dispense FreeStyle [employer] or similar. 02/04/17   Betancourt, Aura Fey, NP  DULoxetine (CYMBALTA) 30 MG capsule Take 30 mg by mouth 2 (two) times daily. 01/17/16 01/16/17  [provider]  glucose blood (BAYER CONTOUR TEST) test strip 1 each by Other route 4 (four) times daily. 01/21/17   Dessa Phi, DO  Insulin Pen Needle 31G X 5 MM MISC Use daily with lantus solostar pen 01/21/17   Dessa Phi, DO  Insulin Syringe-Needle U-100 30G X 1/2" 0.3 ML MISC 1 Syringe by Does not apply route 3 (three) times daily as needed (with meals). 01/21/17   Dessa Phi, DO  lamoTRIgine (LAMICTAL) 25 MG tablet Take 25 mg by mouth daily. 10/23/16   [provider]  potassium chloride SA (K-DUR,KLOR-CON) 20 MEQ tablet Take 2 tablets (40 mEq total) by mouth once. 01/22/17 01/22/17  Dessa Phi, DO    Family History Family History  Problem Relation Age of Onset  . Kidney Stones Father     Social  History Social History   Tobacco Use  . Smoking status: Current Some Day Smoker    Packs/day: 0.50  . Smokeless tobacco: Never Used  Substance Use Topics  . Alcohol use: No    Comment: June 15th  . Drug use: No     Allergies   Metformin; Morphine; Penicillins; Amoxicillin; and Meperidine   Review of Systems Review of Systems  Constitutional: Negative for chills and fever.  HENT: Negative for congestion and sore throat.   Eyes: Negative for visual disturbance.  Respiratory: Negative for cough and shortness of breath.   Cardiovascular: Negative for chest pain.  Gastrointestinal: Positive for abdominal pain and nausea. Negative for diarrhea and vomiting.  Genitourinary: Negative for dysuria, flank pain, frequency, hematuria and urgency.  Musculoskeletal: Negative for arthralgias and myalgias.  Skin: Negative for rash.  Neurological: Negative for dizziness, syncope, weakness, light-headedness, numbness and headaches.  Psychiatric/Behavioral: Negative for sleep disturbance. The patient is not nervous/anxious.      Physical Exam Updated Vital Signs BP 128/88   Pulse 77   Temp 98.7 F (37.1 C) (Oral)   Resp 17   Ht _0  (1.702 m)   Wt 59 kg (130  lb)   SpO2 97%   BMI 20.36 kg/m   Physical Exam  Constitutional: He is oriented to person, place, and time. He appears well-developed and well-nourished.  Non-toxic appearance. No distress.  Patient chronically ill-appearing.  Patient with strong smell of ketones.  HENT:  Head: Normocephalic and atraumatic.  Mucous membranes dry  Eyes: Conjunctivae are normal. Pupils are equal, round, and reactive to light. Right eye exhibits no discharge. Left eye exhibits no discharge.  Neck: Normal range of motion. Neck supple.  Cardiovascular: Normal rate, regular rhythm, normal heart sounds and intact distal pulses. Exam reveals no gallop and no friction rub.  No murmur heard. Pulmonary/Chest: Effort normal and breath sounds normal. No  stridor. No respiratory distress. He has no wheezes. He has no rales. He exhibits no tenderness.  Abdominal: Soft. Bowel sounds are normal. He exhibits no distension. There is no tenderness. There is no rebound, no guarding, no CVA tenderness, no tenderness at McBurney's point and negative Murphy's sign.  Musculoskeletal: Normal range of motion. He exhibits no tenderness.  Lymphadenopathy:    He has no cervical adenopathy.  Neurological: He is alert and oriented to person, place, and time.  Skin: Skin is warm and dry. Capillary refill takes less than 2 seconds. No rash noted.  Psychiatric: His behavior is normal. Judgment and thought content normal.  Nursing note and vitals reviewed.    ED Treatments / Results  Labs (all labs ordered are listed, but only abnormal results are displayed) Labs Reviewed  CBC - Abnormal; Notable for the following components:      Result Value   WBC 3.7 (*)    RBC 4.03 (*)    Hemoglobin 12.9 (*)    HCT 37.1 (*)    Platelets 106 (*)    All other components within normal limits  URINALYSIS, ROUTINE W REFLEX MICROSCOPIC - Abnormal; Notable for the following components:   Color, Urine STRAW (*)    Glucose, UA >=500 (*)    Hgb urine dipstick MODERATE (*)    Ketones, ur 5 (*)    All other components within normal limits  COMPREHENSIVE METABOLIC PANEL - Abnormal; Notable for the following components:   Sodium 132 (*)    Potassium 2.8 (*)    Chloride 82 (*)    Glucose, Bld 753 (*)    BUN <5 (*)    Calcium 8.7 (*)    AST 134 (*)    ALT 66 (*)    Alkaline Phosphatase 203 (*)    Anion gap 24 (*)    All other components within normal limits  MAGNESIUM - Abnormal; Notable for the following components:   Magnesium 1.6 (*)    All other components within normal limits  BASIC METABOLIC PANEL - Abnormal; Notable for the following components:   Potassium 3.2 (*)    Chloride 89 (*)    Glucose, Bld 487 (*)    BUN <5 (*)    Calcium 7.9 (*)    Anion gap 19 (*)     All other components within normal limits  CBG MONITORING, ED - Abnormal; Notable for the following components:   Glucose-Capillary >600 (*)    All other components within normal limits  CBG MONITORING, ED - Abnormal; Notable for the following components:   Glucose-Capillary 483 (*)    All other components within normal limits  I-STAT VENOUS BLOOD GAS, ED - Abnormal; Notable for the following components:   pH, Ven 7.440 (*)    Bicarbonate 32.1 (*)  TCO2 34 (*)    Acid-Base Excess 7.0 (*)    All other components within normal limits  I-STAT CHEM 8, ED - Abnormal; Notable for the following components:   Sodium 132 (*)    Potassium 2.7 (*)    Chloride 81 (*)    BUN <3 (*)    Glucose, Bld >700 (*)    Calcium, Ion 1.05 (*)    Hemoglobin 12.9 (*)    HCT 38.0 (*)    All other components within normal limits  CBG MONITORING, ED - Abnormal; Notable for the following components:   Glucose-Capillary 420 (*)    All other components within normal limits  CBG MONITORING, ED - Abnormal; Notable for the following components:   Glucose-Capillary 342 (*)    All other components within normal limits  BASIC METABOLIC PANEL  BASIC METABOLIC PANEL  BASIC METABOLIC PANEL  RAPID URINE DRUG SCREEN, HOSP PERFORMED    EKG  EKG Interpretation None       Radiology No results found.  Procedures Procedures (including critical care time)  Medications Ordered in ED Medications  potassium chloride 10 mEq in 100 mL IVPB (10 mEq Intravenous New Bag/Given 02/11/17 1428)  dextrose 5 %-0.45 % sodium chloride infusion (not administered)  insulin regular (NOVOLIN R,HUMULIN R) 100 Units in sodium chloride 0.9 % 100 mL (1 Units/mL) infusion (5.6 Units/hr Intravenous Rate/Dose Change 02/11/17 1516)  ARIPiprazole (ABILIFY) tablet 5 mg (not administered)  atorvastatin (LIPITOR) tablet 10 mg (not administered)  carvedilol (COREG) tablet 12.5 mg (12.5 mg Oral Given 02/11/17 1447)  clonazePAM (KLONOPIN)  tablet 1 mg (not administered)  DULoxetine (CYMBALTA) DR capsule 30 mg (30 mg Oral Given 02/11/17 1421)  fenofibrate tablet 160 mg (160 mg Oral Given 02/11/17 1419)  lamoTRIgine (LAMICTAL) tablet 25 mg (25 mg Oral Given 02/11/17 1419)  methocarbamol (ROBAXIN) tablet 500 mg (500 mg Oral Given 02/11/17 1420)  nabumetone (RELAFEN) tablet 500 mg (500 mg Oral Given 02/11/17 1419)  0.9 %  sodium chloride infusion ( Intravenous New Bag/Given 02/11/17 1412)  enoxaparin (LOVENOX) injection 40 mg (40 mg Subcutaneous Given 02/11/17 1421)  gabapentin (NEURONTIN) tablet 600 mg (not administered)  sodium chloride 0.9 % bolus 1,000 mL (0 mLs Intravenous Stopped 02/11/17 1101)  0.9 %  sodium chloride infusion ( Intravenous Stopped 02/11/17 1101)  sodium chloride 0.9 % bolus 1,000 mL (0 mLs Intravenous Stopped 02/11/17 1153)  potassium chloride SA (K-DUR,KLOR-CON) CR tablet 40 mEq (40 mEq Oral Given 02/11/17 1043)  lactated ringers bolus 1,000 mL (0 mLs Intravenous Stopped 02/11/17 1413)  magnesium sulfate IVPB 2 g 50 mL (0 g Intravenous Stopped 02/11/17 1153)  potassium chloride SA (K-DUR,KLOR-CON) CR tablet 40 mEq (40 mEq Oral Given 02/11/17 1419)     Initial Impression / Assessment and Plan / ED Course  I have reviewed the triage vital signs and the nursing notes.  Pertinent labs & imaging results that were available during my care of the patient were reviewed by me and considered in my medical decision making (see chart for details).     Patient presents to the ED with complaints of hyperglycemia by EMS.  Patient is noncompliant with insulin.  Patient has been admitted 2 times in the past 6 months for DKA.  The patient states that he just does not care and is very lazy when it comes to taking his insulin.  EMS reports that patient was surrounded candy wrappers today.  On exam patient is overall well-appearing with reassuring vital signs.  Patient  is afebrile.  He does have a strong smell of ketones in  the room.  Lungs clear to auscultation bilaterally.  Abdominal exam is benign without any focal tenderness.  Regular rate and rhythm with no murmurs rubs or gallops. Patient does appear clinically dehydrated on exam.  Initial lab work reveals a glucose of 753.  Corrected sodium is normal.  Patient with hypokalemia of 2.8.  Magnesium is 1.6.  No leukocytosis is noted.  Hemoglobin appears at patient's baseline.  UA without signs of infection but does show 5 ketones.  Patient is not acidotic pH is 7.4. Bicarb is 26.  Patient does have an elevated anion gap of 24.  Chronic thrombocytopenia.  Given patient's hypokalemia of 2.7 several rounds of IV potassium and oral potassium were given.  The patient's insulin drip was held initially to allow for transfusion of 1 run of potassium as to prevent any further profound hypokalemia.  Patient is not acidotic at this time.  Spoke with Santiago Glad NP with hospital medicine for further admission.  Is in agreement with admission will see patient in the ED and place admission orders.  Patient is hemodynamic stable this time and appears to be in no acute distress.  Patient was updated on plan of care.  Final Clinical Impressions(s) / ED Diagnoses   Final diagnoses:  Hyperglycemia  Hypokalemia    ED Discharge Orders    None       Aaron Edelman 02/11/17 1546    Duffy Bruce, MD 02/12/17 [phone]

## 2017-02-11 NOTE — ED Notes (Signed)
Upon arrival Pt stated that he is non-compliant with his insulin and does not take it because he did not eat a meal prior to taking it. Pt stated that coming to the hospital is just part of his treatment.  Pt had a bowel movent in bed and stated he could not control it. Feces was brown in color and not solid in nature.

## 2017-02-11 NOTE — ED Notes (Signed)
Per Dr. Erma HeritageIsaacs, pt able to have diet gingerale

## 2017-02-11 NOTE — ED Notes (Signed)
ED Provider at bedside. 

## 2017-02-11 NOTE — ED Notes (Signed)
Patient used the bedpan and voided in the bedpan

## 2017-02-11 NOTE — ED Notes (Signed)
Admitting at the bedside.  

## 2017-02-11 NOTE — H&P (Signed)
History and Physical    Terry Schultz DTO:[id number] DOB: 1971-01-26 DOA: 02/11/2017  PCP: Loraine Leriche., MD Patient coming from: home  Chief Complaint: hyperglycemia and hypkalemia  HPI: Terry Schultz is a 46 y.o. male with medical history significant for uncontrolled diabetes, diabetic polyneuropathy, alcohol abuse, chronic pancreatitis, hypertension, tobacco use since emergency Department chief complaint hyperglycemia and hypokalemia.  Information is obtained from the patient. He openly admits to noncompliance with his insulin and his diet. He states he takes his insulin only occasionally and eats whatever he wants. Chart review indicates patient admitted just about every month. Chart also reveals patient currently this morning fell in the parking lot of employee [facility] and wellness clinic and EMS was called. He denies headache dizziness syncope or near-syncope. He denies nausea vomiting. He reports chronic diarrhea. He denies melena bright red blood per rectum. He denies chest pain palpitation shortness of breath cough fever chills. He denies dysuria hematuria frequency or urgency. He does report that his feet are "very very sensitive".    ED Course: In the emergency department he's given 3 L of fluid IV potassium and insulin drip is initiated. He is afebrile hemodynamically stable and not hypoxic. An ion gap of 24 is not acidotic  Review of Systems: As per HPI otherwise all other systems reviewed and are negative.   Ambulatory Status: Frequent falls due to peripheral neuropathy from diabetes  Past Medical History:  Diagnosis Date  . Alcoholism ([place])   . Diabetes mellitus without complication ([place])   . Pancreatitis     Past Surgical History:  Procedure Laterality Date  . CHOLECYSTECTOMY      Social History   Socioeconomic History  . Marital status: Married    Spouse name: Not on file  . Number of children: Not on file  . Years of education: Not on file  .  Highest education level: Not on file  Social Needs  . Financial resource strain: Not on file  . Food insecurity - worry: Not on file  . Food insecurity - inability: Not on file  . Transportation needs - medical: Not on file  . Transportation needs - non-medical: Not on file  Occupational History  . Not on file  Tobacco Use  . Smoking status: Current Some Day Smoker    Packs/day: 0.50  . Smokeless tobacco: Never Used  Substance and Sexual Activity  . Alcohol use: No    Comment: June 15th  . Drug use: No  . Sexual activity: Not Currently  Other Topics Concern  . Not on file  Social History Narrative  . Not on file    Allergies  Allergen Reactions  . Metformin Other (See Comments)    Reported lactic acidosis by hospital record  . Morphine Itching  . Penicillins Itching    Has patient had a PCN reaction causing immediate rash, facial/tongue/throat swelling, SOB or lightheadedness with hypotension: No Has patient had a PCN reaction causing severe rash involving mucus membranes or skin necrosis: No Has patient had a PCN reaction that required hospitalization: No Has patient had a PCN reaction occurring within the last 10 years: Yes If all of the above answers are "NO", then may proceed with Cephalosporin use.  Marland Kitchen Amoxicillin Rash  . Meperidine Itching    Family History  Problem Relation Age of Onset  . Kidney Stones Father     Prior to Admission medications   Medication Sig Start Date End Date Taking? Authorizing Provider  ARIPiprazole (ABILIFY) 5  MG tablet Take 5 mg by mouth daily.   Yes [provider]  atorvastatin (LIPITOR) 10 MG tablet Take 10 mg daily by mouth. 08/17/16  Yes [provider]  carvedilol (COREG) 12.5 MG tablet Take 12.5 mg by mouth 2 (two) times daily. 07/11/15  Yes [provider]  clonazePAM (KLONOPIN) 1 MG tablet Take 1 mg 2 (two) times daily as needed by mouth. 12/30/15  Yes [provider]  fenofibrate 160 MG tablet  Take 160 mg by mouth. 06/04/16  Yes [provider]  hydrOXYzine (VISTARIL) 50 MG capsule Take 50 mg 2 (two) times daily as needed by mouth. 07/11/15  Yes [provider]  Insulin Glargine (LANTUS SOLOSTAR) 100 UNIT/ML Solostar Pen Inject 22 Units into the skin daily. 01/21/17  Yes Dessa Phi, DO  insulin lispro (HUMALOG) 100 UNIT/ML injection Inject 0-0.1 mLs (0-10 Units total) into the skin 3 (three) times daily before meals. Per sliding scale Patient taking differently: Inject 24 Units 2 (two) times daily at 10 AM and 5 PM into the skin. Per sliding scale  01/21/17  Yes Dessa Phi, DO  loperamide (IMODIUM) 2 MG capsule Take 1 capsule (2 mg total) by mouth as needed for diarrhea or loose stools. 01/21/17  Yes Dessa Phi, DO  methocarbamol (ROBAXIN) 500 MG tablet Take 500 mg by mouth 2 (two) times daily as needed. 11/09/16  Yes [provider]  nabumetone (RELAFEN) 500 MG tablet Take 500 mg by mouth 2 (two) times daily.   Yes [provider]  BAYER MICROLET LANCETS lancets 1 each by Other route 4 (four) times daily. Puncture skin 01/21/17   Dessa Phi, DO  blood glucose meter kit and supplies Dispense based on patient and insurance preference. Use up to four times daily as directed. (FOR ICD-9 250.00, 250.01). 01/21/17   Dessa Phi, DO  Continuous Blood Gluc Sensor MISC 1 each as directed by Does not apply route. Use as directed every 10 days. May dispense FreeStyle [employer] or similar. 02/04/17   Betancourt, Aura Fey, NP  DULoxetine (CYMBALTA) 30 MG capsule Take 30 mg by mouth 2 (two) times daily. 01/17/16 01/16/17  [provider]  glucose blood (BAYER CONTOUR TEST) test strip 1 each by Other route 4 (four) times daily. 01/21/17   Dessa Phi, DO  Insulin Pen Needle 31G X 5 MM MISC Use daily with lantus solostar pen 01/21/17   Dessa Phi, DO  Insulin Syringe-Needle U-100 30G X 1/2" 0.3 ML MISC 1 Syringe by Does not apply  route 3 (three) times daily as needed (with meals). 01/21/17   Dessa Phi, DO  lamoTRIgine (LAMICTAL) 25 MG tablet Take 25 mg by mouth daily. 10/23/16   [provider]  potassium chloride SA (K-DUR,KLOR-CON) 20 MEQ tablet Take 2 tablets (40 mEq total) by mouth once. 01/22/17 01/22/17  Dessa Phi, DO    Physical Exam: Vitals:   02/11/17 1048 02/11/17 1100 02/11/17 1115 02/11/17 1130  BP: (!) 130/92 117/84 126/88 127/85  Pulse: 84 77 76 75  Resp: (!) '24 19 19 17  '$ Temp:      TempSrc:      SpO2: 97% 96% 96% 96%  Weight:      Height:         General:  Appears calm and comfortable in no acute distress Eyes:  PERRL, EOMI, normal lids, iris ENT:  grossly normal hearing, lips & tongue, mucous membranes of his mouth dry but pink Neck:  no LAD, masses or  thyromegaly Cardiovascular:  RRR, no m/r/g. No LE edema.  Respiratory:  CTA bilaterally, no w/r/r. Normal respiratory effort. Abdomen:  soft, ntnd, sluggish bowel sounds no guarding or rebounding Skin:  no rash or induration seen on limited exam Musculoskeletal:  grossly normal tone BUE/BLE, good ROM, no bony abnormality Psychiatric:  grossly normal mood and affect, speech fluent and appropriate, AOx3 Neurologic:  CN 2-12 grossly intact, moves all extremities in coordinated fashion, sensation intact speech clear facial symmetry  Labs on Admission: I have personally reviewed following labs and imaging studies  CBC: Recent Labs  Lab 02/11/17 0925 02/11/17 0941  WBC 3.7*  --   HGB 12.9* 12.9*  HCT 37.1* 38.0*  MCV 92.1  --   PLT 106*  --    Basic Metabolic Panel: Recent Labs  Lab 02/05/17 1205 02/11/17 0925 02/11/17 0941  NA 130* 132* 132*  K 3.3* 2.8* 2.7*  CL 75* 82* 81*  CO2 28 26  --   GLUCOSE 745* 753* >700*  BUN 4* <5* <3*  CREATININE 0.79 0.76 1.00  CALCIUM 9.6 8.7*  --   MG 1.5* 1.6*  --    GFR: Estimated Creatinine Clearance: 75.4 mL/min (by C-G formula based on SCr of 1 mg/dL). Liver Function  Tests: Recent Labs  Lab 02/11/17 0925  AST 134*  ALT 66*  ALKPHOS 203*  BILITOT 1.2  PROT 6.5  ALBUMIN 4.0   No results for input(s): LIPASE, AMYLASE in the last 168 hours. No results for input(s): AMMONIA in the last 168 hours. Coagulation Profile: No results for input(s): INR, PROTIME in the last 168 hours. Cardiac Enzymes: No results for input(s): CKTOTAL, CKMB, CKMBINDEX, TROPONINI in the last 168 hours. BNP (last 3 results) No results for input(s): PROBNP in the last 8760 hours. HbA1C: No results for input(s): HGBA1C in the last 72 hours. CBG: Recent Labs  Lab 02/11/17 0923  GLUCAP >600*   Lipid Profile: No results for input(s): CHOL, HDL, LDLCALC, TRIG, CHOLHDL, LDLDIRECT in the last 72 hours. Thyroid Function Tests: No results for input(s): TSH, T4TOTAL, FREET4, T3FREE, THYROIDAB in the last 72 hours. Anemia Panel: No results for input(s): VITAMINB12, FOLATE, FERRITIN, TIBC, IRON, RETICCTPCT in the last 72 hours. Urine analysis:    Component Value Date/Time   COLORURINE STRAW (A) 01/19/2017 1408   APPEARANCEUR CLEAR 01/19/2017 1408   LABSPEC 1.026 01/19/2017 1408   PHURINE 7.0 01/19/2017 1408   GLUCOSEU >=500 (A) 01/19/2017 1408   HGBUR SMALL (A) 01/19/2017 1408   BILIRUBINUR NEGATIVE 01/19/2017 1408   KETONESUR 5 (A) 01/19/2017 1408   PROTEINUR NEGATIVE 01/19/2017 1408   UROBILINOGEN 0.2 05/31/2009 1242   NITRITE NEGATIVE 01/19/2017 1408   LEUKOCYTESUR NEGATIVE 01/19/2017 1408    Creatinine Clearance: Estimated Creatinine Clearance: 75.4 mL/min (by C-G formula based on SCr of 1 mg/dL).  Sepsis Labs: '@LABRCNTIP'$ (procalcitonin:4,lacticidven:4) )No results found for this or any previous visit (from the past 240 hour(s)).   Radiological Exams on Admission: No results found.  EKG:   Assessment/Plan Principal Problem:   Hyperglycemia, unspecified Active Problems:   Hypothyroidism   OSA (obstructive sleep apnea)   Uncontrolled type 2 diabetes  mellitus with diabetic polyneuropathy, with long-term current use of insulin (HCC)   Hypomagnesemia   Hypokalemia   Hyperglycemia   #1. Hyperglycemia related to uncontrolled diabetes secondary to patient's noncompliance with insulin regimen and diet. This is his third admission in as many months. Serum glucose greater than 700. An ion gap 24. CO2 within the limits of normal.  Potassiums 2.7. In the emergency department he is provided with 3 L of IV fluids and insulin drip is initiated. In addition he is provided with IV potassium -Admit to step down -Continue insulin drip until gap is closed -BMET every 4 hours per protocol -Transition back to his Lantus and Humalog -Clear liquid diet until then -Diabetes coordinator consult  #2. Hypokalemia. Potassium 2.7. Likely related to above. Given 4 runs of 10 mEq of potassium intravenously per protocol -Recheck -Follow closely with serial basic metabolic panels  #3. Diarrhea. Patient with chronic diarrhea. Likely related to #1. No recent antibiotics -Monitor intake and output  #4. ETOH abuse. Chart review indicates remission however last month he was intoxicated.  -ciwa protocol  #5. Diabetic polyneuropathy. Stable -continue home meds  6. Hypertension. Fair control in the emergency department. Home medications include Coreg -Continue home meds  #7. Depression/anxiety. Patient reports seasonal depression and this is bad time of year coupled with divorce, work, 2 children in college.  -continue home meds -Behavioural health consult requested.     DVT prophylaxis: lovenox  Code Status: full  Family Communication: none present  Disposition Plan: home  Consults called: behavioral healht  Admission status: inpatient    Radene Gunning MD [facility]  If 7PM-7AM, please contact night-coverage [email or web address] Password [email or web address]  02/11/2017, 12:30 PM

## 2017-02-11 NOTE — Telephone Encounter (Signed)
Noted will follow ER evaluation today to see if admitted or discharged. Noted blood sugar greater than 700 and potassium lower than last week check and patient had BM on gurney in ER.  Ethanol from my clinic positive unsure if using alcohol wipe for site prep gave false positive or patient had consumed alcohol within previous 24 hours.  RN notified me she had used alcohol wipe for skin prep that day but was third tube drawn and skin was dry.   If patient discharged schedule follow up with me next Tuesday 16 Feb 2017 and encourage patient to reschedule [facility] post[facility].

## 2017-02-11 NOTE — ED Notes (Signed)
Date and time results received: 02/11/17  .now (use smartphrase ".now" to insert current time)  Test: glucose Critical Value: 753  Name of Provider Notified: Joselyn Glassmanyler PA  Orders Received? Or Actions Taken?: Orders Received - See Orders for detailsmedication

## 2017-02-11 NOTE — ED Notes (Signed)
Pt given ice water.

## 2017-02-11 NOTE — ED Notes (Signed)
Pt on bedpan for urine and bowel movement

## 2017-02-12 DIAGNOSIS — R739 Hyperglycemia, unspecified: Secondary | ICD-10-CM

## 2017-02-12 DIAGNOSIS — F321 Major depressive disorder, single episode, moderate: Secondary | ICD-10-CM

## 2017-02-12 DIAGNOSIS — E1110 Type 2 diabetes mellitus with ketoacidosis without coma: Principal | ICD-10-CM

## 2017-02-12 DIAGNOSIS — F332 Major depressive disorder, recurrent severe without psychotic features: Secondary | ICD-10-CM

## 2017-02-12 DIAGNOSIS — Z9114 Patient's other noncompliance with medication regimen: Secondary | ICD-10-CM

## 2017-02-12 DIAGNOSIS — F17210 Nicotine dependence, cigarettes, uncomplicated: Secondary | ICD-10-CM

## 2017-02-12 LAB — BASIC METABOLIC PANEL
Anion gap: 9 (ref 5–15)
BUN: <5 mg/dL — ABNORMAL LOW (ref 6–20)
CO2: 32 mmol/L (ref 22–32)
Calcium: 7.7 mg/dL — ABNORMAL LOW (ref 8.9–10.3)
Chloride: 92 mmol/L — ABNORMAL LOW (ref 101–111)
Creatinine, Ser: 0.54 mg/dL — ABNORMAL LOW (ref 0.61–1.24)
GFR calc Af Amer: >60 mL/min (ref >60)
GFR calc non Af Amer: >60 mL/min (ref >60)
Glucose, Bld: 259 mg/dL — ABNORMAL HIGH (ref 65–99)
Potassium: 2.5 mmol/L — CL (ref 3.5–5.1)
Sodium: 133 mmol/L — ABNORMAL LOW (ref 135–145)

## 2017-02-12 LAB — RAPID URINE DRUG SCREEN, HOSP PERFORMED
AMPHETAMINES: NOT DETECTED
Barbiturates: NOT DETECTED
Benzodiazepines: NOT DETECTED
Cocaine: NOT DETECTED
OPIATES: NOT DETECTED
TETRAHYDROCANNABINOL: NOT DETECTED

## 2017-02-12 LAB — GLUCOSE, CAPILLARY
GLUCOSE-CAPILLARY: 123 mg/dL — AB (ref 65–99)
GLUCOSE-CAPILLARY: 176 mg/dL — AB (ref 65–99)
GLUCOSE-CAPILLARY: 204 mg/dL — AB (ref 65–99)
Glucose-Capillary: 248 mg/dL — ABNORMAL HIGH (ref 65–99)
Glucose-Capillary: 226 mg/dL — ABNORMAL HIGH (ref 65–99)
Glucose-Capillary: 275 mg/dL — ABNORMAL HIGH (ref 65–99)

## 2017-02-12 LAB — MAGNESIUM
MAGNESIUM: 1.3 mg/dL — AB (ref 1.7–2.4)
Magnesium: 2.2 mg/dL (ref 1.7–2.4)

## 2017-02-12 LAB — POTASSIUM
POTASSIUM: 3.4 mmol/L — AB (ref 3.5–5.1)
Potassium: 2.7 mmol/L — CL (ref 3.5–5.1)

## 2017-02-12 MED ORDER — DULOXETINE HCL 30 MG PO CPEP
30.0000 mg | ORAL_CAPSULE | Freq: Every evening | ORAL | Status: DC
  Administered 2017-02-12 – 2017-02-13 (×2): 30 mg via ORAL
  Filled 2017-02-12 (×2): qty 1

## 2017-02-12 MED ORDER — INSULIN ASPART 100 UNIT/ML ~~LOC~~ SOLN
0.0000 [IU] | SUBCUTANEOUS | Status: DC
  Administered 2017-02-12: 8 [IU] via SUBCUTANEOUS
  Administered 2017-02-12: 5 [IU] via SUBCUTANEOUS

## 2017-02-12 MED ORDER — DULOXETINE HCL 60 MG PO CPEP: 60.0000 mg | ORAL_CAPSULE | ORAL | Status: DC

## 2017-02-12 MED ORDER — NALTREXONE HCL 50 MG PO TABS
50.0000 mg | ORAL_TABLET | Freq: Every day | ORAL | Status: DC
  Administered 2017-02-12 – 2017-02-14 (×3): 50 mg via ORAL
  Filled 2017-02-12 (×3): qty 1

## 2017-02-12 MED ORDER — POTASSIUM CHLORIDE CRYS ER 20 MEQ PO TBCR
40.0000 meq | EXTENDED_RELEASE_TABLET | Freq: Once | ORAL | Status: AC
  Administered 2017-02-12: 40 meq via ORAL
  Filled 2017-02-12: qty 2

## 2017-02-12 MED ORDER — MAGNESIUM SULFATE 2 GM/50ML IV SOLN
2.0000 g | INTRAVENOUS | Status: AC
  Administered 2017-02-12 (×2): 2 g via INTRAVENOUS
  Filled 2017-02-12 (×2): qty 50

## 2017-02-12 MED ORDER — DULOXETINE HCL 60 MG PO CPEP
60.0000 mg | ORAL_CAPSULE | Freq: Every morning | ORAL | Status: DC
Start: 2017-02-13 — End: 2017-02-14
  Administered 2017-02-13 – 2017-02-14 (×2): 60 mg via ORAL
  Filled 2017-02-12 (×2): qty 1

## 2017-02-12 MED ORDER — GLUCERNA SHAKE PO LIQD
237.0000 mL | Freq: Two times a day (BID) | ORAL | Status: DC
  Administered 2017-02-13 – 2017-02-14 (×3): 237 mL via ORAL

## 2017-02-12 MED ORDER — POTASSIUM CHLORIDE 10 MEQ/100ML IV SOLN
10.0000 meq | INTRAVENOUS | Status: AC
  Administered 2017-02-12 (×4): 10 meq via INTRAVENOUS
  Filled 2017-02-12 (×5): qty 100

## 2017-02-12 MED ORDER — INSULIN GLARGINE 100 UNIT/ML ~~LOC~~ SOLN
27.0000 [IU] | Freq: Every day | SUBCUTANEOUS | Status: DC
  Administered 2017-02-12: 27 [IU] via SUBCUTANEOUS
  Filled 2017-02-12: qty 0.27

## 2017-02-12 NOTE — Progress Notes (Signed)
PROGRESS NOTE  Terry Schultz WGN:[id number] DOB: May 09, 1970 DOA: 02/11/2017 PCP: Cheron SchaumannVelazquez, Gretchen Y., MD  HPI/Recap of past 24 hours:  Terry Schultz is a 46 y.o. year old male with medical history significant for uncontrolled diabetes, diabetic polyneuropathy, alcohol abuse, chronic pancreatitis, hypertension, tobacco use, depression and anxiety   who presented on 02/11/2017 with hyperglycemia and was found to have glucose greater than 700, potassium 2.8 with initial anion gap of 24 and bicarb of 32, magnesium 1.6 concerning for DKA.Marland Kitchen.  Patient was given bolus fluids, repleted with IV potassium and magnesium and started on insulin drip  This morning he reports missing his insulin therapy at home because of his poor mood. He also states his insulin injections are painful and he often forgets.   Assessment/Plan: Principal Problem:   Hyperglycemia, unspecified Active Problems:   Anxiety disorder   Essential hypertension   Hypothyroidism   OSA (obstructive sleep apnea)   Uncontrolled type 2 diabetes mellitus with diabetic polyneuropathy, with long-term current use of insulin (HCC)   Depression with anxiety   Chronic back pain   Diabetic peripheral neuropathy (HCC)   Hypomagnesemia   Protein-calorie malnutrition, severe   Hypokalemia   Diarrhea   Hyperglycemia   DKA, secondary to Nonadherence to insulin therapy, improving Anion gap closed overnight and lantus was given then.  Insulin gtt discontinued today Monitor glucose closely, expect will need higher lantus dose tonight  Hypokalemia and hypomagnesemia, 2/2 to hyperglycemia  Aggressive IV potassium and magnesium repletion Checking magnesium and potassium now Monitor on telemetry  Chronic diarrhea States happens in setting of missing insulin dose Does report waking up in bowel movements at home Achieve euglycemia and reevaulate  Alcohol abuse CIWA protocol  HTN, controlled Coreg  Depression/Anxiety Patient  admits to poor mood, and component in poor adherence to [facility] Home cymbalta, abilify and klonopin  Diabetic peripheral neuropathy Gabapentin  Code Status: Full Code  Family Communication: no family at bedside   Disposition Plan: euglycemia on oral regimen, [facility] recommendations   [facility]  [facility]   Procedures:  None   Antimicrobials:  None   Cultures:  None  DVT prophylaxis: Heparin   Objective: Vitals:   02/11/17 1954 02/11/17 2239 02/12/17 0028 02/12/17 0357  BP: 112/75 114/71 105/77 95/69  Pulse: 75 67 65 66  Resp:      Temp: 98.7 F (37.1 C)  98.1 F (36.7 C) 98.4 F (36.9 C)  TempSrc: Oral  Oral Oral  SpO2: 98%  96% 97%  Weight: 55.2 kg (121 lb 12.8 oz)   55.2 kg (121 lb 9.6 oz)  Height:        Intake/Output Summary (Last 24 hours) at 02/12/2017 0735 Last data filed at 02/12/2017 0727 Gross per 24 hour  Intake 2425.78 ml  Output 1250 ml  Net 1175.78 ml   Filed Weights   02/11/17 0931 02/11/17 1954 02/12/17 0357  Weight: 59 kg (130 lb) 55.2 kg (121 lb 12.8 oz) 55.2 kg (121 lb 9.6 oz)    Exam:   General: Lying in bed comfortably, in no distress  Cardiovascular: Regular rate and rhythm, no appreciable murmurs or gallops  Respiratory: Lungs clear to auscultation bilaterally rotation, no wheeze, no wheezes  Abdomen: Soft, nondistended, nontender normal bowel sounds  Musculoskeletal: Normal range of motion  Skin: Clean, dry, intact  Psychiatry: Flat affect   Data Reviewed: CBC: Recent Labs  Lab 02/11/17 0925 02/11/17 0941  WBC 3.7*  --   HGB 12.9* 12.9*  HCT 37.1* 38.0*  MCV 92.1  --   PLT 106*  --    Basic Metabolic Panel: Recent Labs  Lab 02/05/17 1205  02/11/17 0925 02/11/17 0941 02/11/17 1243 02/11/17 1611 02/11/17 2045 02/12/17 0538  NA 130*   < > 132* 132* 135 134* 135 133*  K 3.3*  --  2.8* 2.7* 3.2* 2.9* 3.0* 2.5*  CL 75*  --  82* 81* 89* 91* 92* 92*  CO2 28  --  26  --  27 28 33* 32    GLUCOSE 745*   < > 753* >700* 487* 279* 114* 259*  BUN 4*   < > <5* <3* <5* <5* <5* <5*  CREATININE 0.79  --  0.76 1.00 0.64 0.54* 0.50* 0.54*  CALCIUM 9.6  --  8.7*  --  7.9* 7.8* 7.7* 7.7*  MG 1.5*  --  1.6*  --   --   --   --   --    < > = values in this interval not displayed.   GFR: Estimated Creatinine Clearance: 88.2 mL/min (A) (by C-G formula based on SCr of 0.54 mg/dL (L)). Liver Function Tests: Recent Labs  Lab 02/11/17 0925  AST 134*  ALT 66*  ALKPHOS 203*  BILITOT 1.2  PROT 6.5  ALBUMIN 4.0   No results for input(s): LIPASE, AMYLASE in the last 168 hours. No results for input(s): AMMONIA in the last 168 hours. Coagulation Profile: No results for input(s): INR, PROTIME in the last 168 hours. Cardiac Enzymes: No results for input(s): CKTOTAL, CKMB, CKMBINDEX, TROPONINI in the last 168 hours. BNP (last 3 results) No results for input(s): PROBNP in the last 8760 hours. HbA1C: No results for input(s): HGBA1C in the last 72 hours. CBG: Recent Labs  Lab 02/11/17 2245 02/11/17 2351 02/12/17 0058 02/12/17 0206 02/12/17 0725  GLUCAP 163* 189* 176* 123* 248*   Lipid Profile: No results for input(s): CHOL, HDL, LDLCALC, TRIG, CHOLHDL, LDLDIRECT in the last 72 hours. Thyroid Function Tests: No results for input(s): TSH, T4TOTAL, FREET4, T3FREE, THYROIDAB in the last 72 hours. Anemia Panel: No results for input(s): VITAMINB12, FOLATE, FERRITIN, TIBC, IRON, RETICCTPCT in the last 72 hours. Urine analysis:    Component Value Date/Time   COLORURINE STRAW (A) 02/11/2017 1128   APPEARANCEUR CLEAR 02/11/2017 1128   LABSPEC 1.023 02/11/2017 1128   PHURINE 6.0 02/11/2017 1128   GLUCOSEU >=500 (A) 02/11/2017 1128   HGBUR MODERATE (A) 02/11/2017 1128   BILIRUBINUR NEGATIVE 02/11/2017 1128   KETONESUR 5 (A) 02/11/2017 1128   PROTEINUR NEGATIVE 02/11/2017 1128   UROBILINOGEN 0.2 05/31/2009 1242   NITRITE NEGATIVE 02/11/2017 1128   LEUKOCYTESUR NEGATIVE 02/11/2017 1128    Sepsis Labs: @LABRCNTIP (procalcitonin:4,lacticidven:4)  )No results found for this or any previous visit (from the past 240 hour(s)).    Studies: No results found.  Scheduled Meds: . ARIPiprazole  5 mg Oral Daily  . atorvastatin  10 mg Oral q1800  . carvedilol  12.5 mg Oral BID  . DULoxetine  30 mg Oral BID  . enoxaparin (LOVENOX) injection  40 mg Subcutaneous Q24H  . feeding supplement (ENSURE ENLIVE)  237 mL Oral BID BM  . fenofibrate  160 mg Oral Daily  . gabapentin  600 mg Oral TID  . insulin aspart  0-5 Units Subcutaneous QHS  . insulin aspart  0-9 Units Subcutaneous TID WC  . insulin glargine  22 Units Subcutaneous QHS  . lamoTRIgine  25 mg Oral Daily  . nabumetone  500 mg  Oral BID    Continuous Infusions: . sodium chloride 100 mL/hr at 02/11/17 1412  . insulin (NOVOLIN-R) infusion 3.1 Units/hr (02/11/17 2246)     LOS: 1 day     Laverna PeaceShayla D Kiondre Grenz, MD [facility] Pager [phone]  If 7PM-7AM, please contact night-coverage [email or web address] Password [email or web address] 02/12/2017, 7:35 AM

## 2017-02-12 NOTE — Progress Notes (Signed)
Initial Nutrition Assessment  DOCUMENTATION CODES:   Severe malnutrition in context of chronic illness  INTERVENTION:   -Discontinue Ensure Enlive  -Glucerna shake BID; each supplement provides 220 kcals and 10 grams of protein  NUTRITION DIAGNOSIS:   Severe Malnutrition related to chronic illness(uncontrolled DM r/t recurrent DKA and hyperglycemia) as evidenced by percent weight loss, severe muscle depletion.  GOAL:   Patient will meet greater than or equal to 90% of their needs   MONITOR:   PO intake, Supplement acceptance, Labs, Weight trends, Skin, I & O's  REASON FOR ASSESSMENT:   Malnutrition Screening Tool    ASSESSMENT:   Pt is a 46 year old male with PMH of uncontrolled diabetes, diabetic polyneuropathy, unspecified, medical non compliance, depression/anxiety, alcohol abuse, and pancreatitis. Pt presented on 02/11/17 and was found to have glucose >700, concerning for DKA.  On past review of patient's chart, patient is admitted almost every month for DKA and hyperglycemia.  Per chart, pt is noncompliant with medications such as insulin and "eats whatever he wants". Pt also has known etoh abuse but declined reporting how much alcohol he consumes. Pt reports a decreased appetite in the past 6 months and drinking 10 oz juice with either oatmeal or a poptart. Pt doesn't eat lunch. He eats fast food such as a burger or pizza for lunch. Pt doesn't cook for himself. Pt ate 100% of breakfast.  Pt reports weighing 145 lbs 6 months ago, which he endorse as his UBW. Pt now weighs 121 lbs, indicating a 16.5% weight loss in two months, severe for time frame.   Medications- Lovenox, Novolog, Lantus  Labs- CBGs: [phone], Na 133 (L), K 2.5 (L), BUN <5 (L), Cr 0.54 (L), Ca 7.7 (L)  NUTRITION - FOCUSED PHYSICAL EXAM:    Most Recent Value  Orbital Region  No depletion  Upper Arm Region  Moderate depletion  Thoracic and Lumbar Region  Mild depletion  Buccal Region  No depletion   Temple Region  Mild depletion  Clavicle Bone Region  Moderate depletion  Clavicle and Acromion Bone Region  Mild depletion  Scapular Bone Region  Mild depletion  Dorsal Hand  No depletion  Patellar Region  Severe depletion  Anterior Thigh Region  Severe depletion  Posterior Calf Region  Severe depletion  Edema (RD Assessment)  None  Hair  Reviewed  Eyes  Reviewed  Mouth  Reviewed  Skin  Reviewed  Nails  Reviewed       Diet Order:  Diet Carb Modified Fluid consistency: Thin; Room service appropriate? Yes  EDUCATION NEEDS:   No education needs have been identified at this time  Skin:  Skin Assessment: Skin Integrity Issues: Skin Integrity Issues:: Stage I Stage I: PI on sacrum  Last BM:  PTA  Height:   Ht Readings from Last 1 Encounters:  02/11/17 5\' 7"  (1.702 m)    Weight:   Wt Readings from Last 1 Encounters:  02/12/17 121 lb 9.6 oz (55.2 kg)    Ideal Body Weight:  67.3 kg  BMI:  Body mass index is 19.05 kg/m.  Estimated Nutritional Needs:   Kcal:  1750-1950 kcals  Protein:  100-115 grams  Fluid:  1.7-1.9 L    Wynetta EmeryGrace Herman [facility] Dietetic Intern Pager: [phone] 02/12/2017 12:02 PM

## 2017-02-12 NOTE — Progress Notes (Signed)
Dr. Dennison NancyS. Nettey text paged and updated with latest K Level at 2.5 . New orders received.

## 2017-02-12 NOTE — Consult Note (Addendum)
[facility] Face-to-Face Psychiatry Consult   Reason for Consult:  Medication management for anxiety and depression. Referring Physician:  Dr. Lonny Prude Patient Identification: Terry Schultz MRN:  [id number] Principal Diagnosis: MDD (major depressive disorder), single episode, moderate ([place]) Diagnosis:   Patient Active Problem List   Diagnosis Date Noted  . Hyperglycemia [R73.9] 02/11/2017  . Hypokalemia [E87.6] 01/21/2017  . Diarrhea [R19.7] 01/21/2017  . Pressure injury of skin [L89.90] 01/20/2017  . Protein-calorie malnutrition, severe [E43] 01/20/2017  . DKA (diabetic ketoacidoses) ([place]) [E13.10] 12/05/2016  . Depression with anxiety [F41.8] 12/05/2016  . HLD (hyperlipidemia) [E78.5] 12/05/2016  . Chronic back pain [M54.9, G89.29] 12/05/2016  . Alcoholism ([place]) [F10.20]   . Hypomagnesemia [E83.42] 05/29/2016  . Cervical myelopathy ([place]) [G95.9] 05/28/2016  . Ambulatory dysfunction [R26.2] 05/27/2016  . Hyperglycemia, unspecified [R73.9] 05/27/2016  . Numbness and tingling of both legs [R20.0, R20.2] 05/27/2016  . Chronic pain of both knees [M25.561, M25.562, G89.29] 05/21/2016  . Diabetic peripheral neuropathy ([place]) [E11.42] 05/21/2016  . Moderate episode of recurrent major depressive disorder ([place]) [F33.1] 05/22/2015  . Panic disorder [F41.0] 05/22/2015  . Hepatic steatosis [K76.0] 11/21/2013  . Nondependent alcohol abuse, in remission [F10.11] 09/30/2013  . Allergic rhinitis [J30.9] 09/30/2013  . Anxiety disorder [F41.9] 09/30/2013  . Chronic cholecystitis [K81.1] 09/30/2013  . 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 Essential hypertension [I10] 09/30/2013  . Familial hypertriglyceridemia [E78.1] 09/30/2013  . Hypercholesterolemia [E78.00] 09/30/2013  . Hypothyroidism [E03.9] 09/30/2013  . Insomnia [G47.00] 09/30/2013  . Low testosterone [R79.89] 09/30/2013  . OSA (obstructive sleep apnea) [G47.33] 09/30/2013  . Pancreatic pseudocyst [K86.3] 09/30/2013  .  Depressive disorder [F32.9] 09/30/2013  . Recurrent pancreatitis ([place]) [K86.1] 09/30/2013  . Uncontrolled type 2 diabetes mellitus with diabetic polyneuropathy, with long-term current use of insulin (HCC) [E11.42, Z79.4, E11.65] 09/30/2013  . Vitamin D deficiency [E55.9] 09/30/2013    Total Time spent with patient: 1 hour  Subjective:   Terry Schultz is a 46 y.o. male patient admitted with hyperglycemia in the setting of medication noncompliance.  HPI:   Per chart review, patient has been admitted twice in the past 6 months for DKA in the setting of medication noncompliance. He called EMS to his home yesterday and he was found "surrounded by candy wrappers." He reported that he is "lazy" when it comes to taking insulin. Blood glucose was 753 on admission. Home medications include Abilify 5 mg daily, Cymbalta 30 mg BID, Lamictal 25 mg daily and Gabapentin 600 mg TID.   On interview, Terry Schultz initially reports that his anxiety and depression are well managed but he later endorses several neurovegetative symptoms. He went to rehab for alcohol abuse from June-September. He denies alcohol use since this time. He reports that he started heavily drinking after he divorced his wife 4 years ago. He was drinking up to a half a gallon of gin daily. He denies a history of alcohol withdrawal. He has not attended AA and was encouraged to do so. He continues to have cravings for alcohol. He has used Vivitrol in the past with good effect. He reports poor sleep with Trazodone use. He is easily awakened at night secondary to noise or anxiety. 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 He also reports poor appetite with a 60 pound weight loss over the past year. He denies fevers, chills or sickness. He no longer finds previous activities enjoyable such as involvement in [place]. He  denies SI or a history of suicide attempts. He denies a history of manic  symptoms or AVH. He reports compliance with Cymbalta and Abilify. He started these medications while in rehab. He reports poor medication compliance with insulin because he forgets to take it unlike his oral medications that he keeps in a pill box. His medications are managed by his PCP.   Past Psychiatric History: Depression, anxiety and alcohol abuse.   Risk to Self: Is patient at risk for suicide?: No Risk to Others:  None. Denies HI. Prior Inpatient Therapy:  Denies  Prior Outpatient Therapy:   Prior medications include Klonopin 1 mg daily PRN, Lamictal 25 mg daily and Atarax 50 mg BID PRN. PMP indicates that Klonopin or Ativan have not been recently prescribed. He last filled a prescription for Ativan in June 2018 (#20).   Past Medical History:  Past Medical History:  Diagnosis Date  . Alcoholism ([place])   . Anxiety   . Bipolar disorder ([place])   . Depression   . GERD (gastroesophageal reflux disease)   . High cholesterol   . History of kidney stones   . Hypertension   . Pancreatitis   . Pneumonia ~ 2012  . Type II diabetes mellitus ([place])     Past Surgical History:  Procedure Laterality Date  . CYSTOSCOPY W/ STONE MANIPULATION  X 1   "there's still stones in there" (02/11/2017)  . LAPAROSCOPIC CHOLECYSTECTOMY     Family History:  Family History  Problem Relation Age of Onset  . Kidney Stones Father    Family Psychiatric  History: Denies  Social History:  Social History   Substance and Sexual Activity  Alcohol Use Yes   Comment: 02/11/2017 "stopped September 11, 2016"     Social History   Substance and Sexual Activity  Drug Use No    Social History   Socioeconomic History  . Marital status: Divorced    Spouse name: None  . Number of children: None  . Years of education: None  . Highest education level: None  Social Needs  . Financial resource strain: None  . Food insecurity - worry: None  . Food insecurity - inability: None  . Transportation needs - medical:  None  . Transportation needs - non-medical: None  Occupational History  . None  Tobacco Use  . Smoking status: Current Some Day Smoker    Packs/day: 0.12    Years: 7.00    Pack years: 0.84    Types: Cigarettes  . Smokeless tobacco: Never Used  Substance and Sexual Activity  . Alcohol use: Yes    Comment: 02/11/2017 "stopped September 11, 2016"  . Drug use: No  . Sexual activity: Not Currently  Other Topics Concern  . None  Social History Narrative  . None   Additional Social History: He lives at home alone. He was married for 25 years. He has been divorced for 4 years. He has 2 grown children that he is not currently close to due to them being upset about the divorce and his past alcohol use. He reports sobriety from alcohol use since September although he still has cravings. He smokes 7 cigarettes daily. He used to smoke 1 ppd for 4 years. He denies illicit substance use. 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     Allergies:   Allergies  Allergen Reactions  . Metformin Other (See Comments)    Reported lactic acidosis by hospital record  . Morphine Itching  .  Penicillins Itching    Has patient had a PCN reaction causing immediate rash, facial/tongue/throat swelling, SOB or lightheadedness with hypotension: No Has patient had a PCN reaction causing severe rash involving mucus membranes or skin necrosis: No Has patient had a PCN reaction that required hospitalization: No Has patient had a PCN reaction occurring within the last 10 years: Yes If all of the above answers are "NO", then may proceed with Cephalosporin use.  Marland Kitchen Amoxicillin Rash  . Meperidine Itching    Labs:  Results for orders placed or performed during the hospital encounter of 02/11/17 (from the past 48 hour(s))  CBG monitoring, ED     Status: Abnormal   Collection Time: 02/11/17  9:23 AM  Result Value Ref Range   Glucose-Capillary >600 (HH) 65 - 99 mg/dL  CBC     Status: Abnormal    Collection Time: 02/11/17  9:25 AM  Result Value Ref Range   WBC 3.7 (L) 4.0 - 10.5 K/uL   RBC 4.03 (L) 4.22 - 5.81 MIL/uL   Hemoglobin 12.9 (L) 13.0 - 17.0 g/dL   HCT 37.1 (L) 39.0 - 52.0 %   MCV 92.1 78.0 - 100.0 fL   MCH 32.0 26.0 - 34.0 pg   MCHC 34.8 30.0 - 36.0 g/dL   RDW 13.8 11.5 - 15.5 %   Platelets 106 (L) 150 - 400 K/uL    Comment: REPEATED TO VERIFY PLATELET COUNT CONFIRMED BY SMEAR   Comprehensive metabolic panel     Status: Abnormal   Collection Time: 02/11/17  9:25 AM  Result Value Ref Range   Sodium 132 (L) 135 - 145 mmol/L   Potassium 2.8 (L) 3.5 - 5.1 mmol/L   Chloride 82 (L) 101 - 111 mmol/L   CO2 26 22 - 32 mmol/L   Glucose, Bld 753 (HH) 65 - 99 mg/dL    Comment: CRITICAL RESULT CALLED TO, READ BACK BY AND VERIFIED WITH: K.MOON,RN 02/11/17 1038 B.DAVIS    BUN <5 (L) 6 - 20 mg/dL   Creatinine, Ser 0.76 0.61 - 1.24 mg/dL   Calcium 8.7 (L) 8.9 - 10.3 mg/dL   Total Protein 6.5 6.5 - 8.1 g/dL   Albumin 4.0 3.5 - 5.0 g/dL   AST 134 (H) 15 - 41 U/L   ALT 66 (H) 17 - 63 U/L   Alkaline Phosphatase 203 (H) 38 - 126 U/L   Total Bilirubin 1.2 0.3 - 1.2 mg/dL   GFR calc non Af Amer >60 >60 mL/min   GFR calc Af Amer >60 >60 mL/min    Comment: (NOTE) The eGFR has been calculated using the CKD EPI equation. This calculation has not been validated in all clinical situations. eGFR's persistently <60 mL/min signify possible Chronic Kidney Disease.    Anion gap 24 (H) 5 - 15  Magnesium     Status: Abnormal   Collection Time: 02/11/17  9:25 AM  Result Value Ref Range   Magnesium 1.6 (L) 1.7 - 2.4 mg/dL  I-Stat Venous Blood Gas, ED (order at [facility] and [facility] only)     Status: Abnormal   Collection Time: 02/11/17  9:40 AM  Result Value Ref Range   pH, Ven 7.440 (H) 7.250 - 7.430   pCO2, Ven 47.3 44.0 - 60.0 mmHg   pO2, Ven 39.0 32.0 - 45.0 mmHg   Bicarbonate 32.1 (H) 20.0 - 28.0 mmol/L   TCO2 34 (H) 22 - 32 mmol/L   O2 Saturation 74.0 %   Acid-Base Excess 7.0 (H) 0.0  -  2.0 mmol/L   Patient temperature HIDE    Sample type VENOUS    Comment NOTIFIED PHYSICIAN   I-stat Chem 8, ED     Status: Abnormal   Collection Time: 02/11/17  9:41 AM  Result Value Ref Range   Sodium 132 (L) 135 - 145 mmol/L   Potassium 2.7 (LL) 3.5 - 5.1 mmol/L   Chloride 81 (L) 101 - 111 mmol/L   BUN <3 (L) 6 - 20 mg/dL   Creatinine, Ser 1.00 0.61 - 1.24 mg/dL   Glucose, Bld >700 (HH) 65 - 99 mg/dL   Calcium, Ion 1.05 (L) 1.15 - 1.40 mmol/L   TCO2 29 22 - 32 mmol/L   Hemoglobin 12.9 (L) 13.0 - 17.0 g/dL   HCT 38.0 (L) 39.0 - 52.0 %   Comment NOTIFIED PHYSICIAN   Urinalysis, Routine w reflex microscopic     Status: Abnormal   Collection Time: 02/11/17 11:28 AM  Result Value Ref Range   Color, Urine STRAW (A) YELLOW   APPearance CLEAR CLEAR   Specific Gravity, Urine 1.023 1.005 - 1.030   pH 6.0 5.0 - 8.0   Glucose, UA >=500 (A) NEGATIVE mg/dL   Hgb urine dipstick MODERATE (A) NEGATIVE   Bilirubin Urine NEGATIVE NEGATIVE   Ketones, ur 5 (A) NEGATIVE mg/dL   Protein, ur NEGATIVE NEGATIVE mg/dL   Nitrite NEGATIVE NEGATIVE   Leukocytes, UA NEGATIVE NEGATIVE   RBC / HPF 6-30 0 - 5 RBC/hpf   WBC, UA 0-5 0 - 5 WBC/hpf   Bacteria, UA NONE SEEN NONE SEEN   Squamous Epithelial / LPF NONE SEEN NONE SEEN   Hyaline Casts, UA PRESENT   Basic metabolic panel     Status: Abnormal   Collection Time: 02/11/17 12:43 PM  Result Value Ref Range   Sodium 135 135 - 145 mmol/L   Potassium 3.2 (L) 3.5 - 5.1 mmol/L   Chloride 89 (L) 101 - 111 mmol/L   CO2 27 22 - 32 mmol/L   Glucose, Bld 487 (H) 65 - 99 mg/dL   BUN <5 (L) 6 - 20 mg/dL   Creatinine, Ser 0.64 0.61 - 1.24 mg/dL   Calcium 7.9 (L) 8.9 - 10.3 mg/dL   GFR calc non Af Amer >60 >60 mL/min   GFR calc Af Amer >60 >60 mL/min    Comment: (NOTE) The eGFR has been calculated using the CKD EPI equation. This calculation has not been validated in all clinical situations. eGFR's persistently <60 mL/min signify possible Chronic  Kidney Disease.    Anion gap 19 (H) 5 - 15  CBG monitoring, ED     Status: Abnormal   Collection Time: 02/11/17  1:02 PM  Result Value Ref Range   Glucose-Capillary 483 (H) 65 - 99 mg/dL  CBG monitoring, ED     Status: Abnormal   Collection Time: 02/11/17  2:08 PM  Result Value Ref Range   Glucose-Capillary 420 (H) 65 - 99 mg/dL  CBG monitoring, ED     Status: Abnormal   Collection Time: 02/11/17  3:14 PM  Result Value Ref Range   Glucose-Capillary 342 (H) 65 - 99 mg/dL  CBG monitoring, ED     Status: Abnormal   Collection Time: 02/11/17  3:59 PM  Result Value Ref Range   Glucose-Capillary 306 (H) 65 - 99 mg/dL  Basic metabolic panel     Status: Abnormal   Collection Time: 02/11/17  4:11 PM  Result Value Ref Range   Sodium 134 (L) 135 -  145 mmol/L   Potassium 2.9 (L) 3.5 - 5.1 mmol/L   Chloride 91 (L) 101 - 111 mmol/L   CO2 28 22 - 32 mmol/L   Glucose, Bld 279 (H) 65 - 99 mg/dL   BUN <5 (L) 6 - 20 mg/dL   Creatinine, Ser 0.54 (L) 0.61 - 1.24 mg/dL   Calcium 7.8 (L) 8.9 - 10.3 mg/dL   GFR calc non Af Amer >60 >60 mL/min   GFR calc Af Amer >60 >60 mL/min    Comment: (NOTE) The eGFR has been calculated using the CKD EPI equation. This calculation has not been validated in all clinical situations. eGFR's persistently <60 mL/min signify possible Chronic Kidney Disease.    Anion gap 15 5 - 15  CBG monitoring, ED     Status: Abnormal   Collection Time: 02/11/17  5:15 PM  Result Value Ref Range   Glucose-Capillary 240 (H) 65 - 99 mg/dL  CBG monitoring, ED     Status: Abnormal   Collection Time: 02/11/17  6:20 PM  Result Value Ref Range   Glucose-Capillary 208 (H) 65 - 99 mg/dL  CBG monitoring, ED     Status: Abnormal   Collection Time: 02/11/17  7:27 PM  Result Value Ref Range   Glucose-Capillary 179 (H) 65 - 99 mg/dL  Glucose, capillary     Status: Abnormal   Collection Time: 02/11/17  8:32 PM  Result Value Ref Range   Glucose-Capillary 123 (H) 65 - 99 mg/dL  Basic  metabolic panel     Status: Abnormal   Collection Time: 02/11/17  8:45 PM  Result Value Ref Range   Sodium 135 135 - 145 mmol/L   Potassium 3.0 (L) 3.5 - 5.1 mmol/L   Chloride 92 (L) 101 - 111 mmol/L   CO2 33 (H) 22 - 32 mmol/L   Glucose, Bld 114 (H) 65 - 99 mg/dL   BUN <5 (L) 6 - 20 mg/dL   Creatinine, Ser 0.50 (L) 0.61 - 1.24 mg/dL   Calcium 7.7 (L) 8.9 - 10.3 mg/dL   GFR calc non Af Amer >60 >60 mL/min   GFR calc Af Amer >60 >60 mL/min    Comment: (NOTE) The eGFR has been calculated using the CKD EPI equation. This calculation has not been validated in all clinical situations. eGFR's persistently <60 mL/min signify possible Chronic Kidney Disease.    Anion gap 10 5 - 15  Glucose, capillary     Status: Abnormal   Collection Time: 02/11/17  9:38 PM  Result Value Ref Range   Glucose-Capillary 136 (H) 65 - 99 mg/dL  Glucose, capillary     Status: Abnormal   Collection Time: 02/11/17 10:45 PM  Result Value Ref Range   Glucose-Capillary 163 (H) 65 - 99 mg/dL  Glucose, capillary     Status: Abnormal   Collection Time: 02/11/17 11:51 PM  Result Value Ref Range   Glucose-Capillary 189 (H) 65 - 99 mg/dL  Glucose, capillary     Status: Abnormal   Collection Time: 02/12/17 12:58 AM  Result Value Ref Range   Glucose-Capillary 176 (H) 65 - 99 mg/dL  Glucose, capillary     Status: Abnormal   Collection Time: 02/12/17  2:06 AM  Result Value Ref Range   Glucose-Capillary 123 (H) 65 - 99 mg/dL  Basic metabolic panel     Status: Abnormal   Collection Time: 02/12/17  5:38 AM  Result Value Ref Range   Sodium 133 (L) 135 - 145 mmol/L  Potassium 2.5 (LL) 3.5 - 5.1 mmol/L    Comment: CRITICAL RESULT CALLED TO, READ BACK BY AND VERIFIED WITH: R.BUENDIA,RN 0715 02/12/17 CLARK,S    Chloride 92 (L) 101 - 111 mmol/L   CO2 32 22 - 32 mmol/L   Glucose, Bld 259 (H) 65 - 99 mg/dL   BUN <5 (L) 6 - 20 mg/dL   Creatinine, Ser 0.54 (L) 0.61 - 1.24 mg/dL   Calcium 7.7 (L) 8.9 - 10.3 mg/dL   GFR  calc non Af Amer >60 >60 mL/min   GFR calc Af Amer >60 >60 mL/min    Comment: (NOTE) The eGFR has been calculated using the CKD EPI equation. This calculation has not been validated in all clinical situations. eGFR's persistently <60 mL/min signify possible Chronic Kidney Disease.    Anion gap 9 5 - 15  Glucose, capillary     Status: Abnormal   Collection Time: 02/12/17  7:25 AM  Result Value Ref Range   Glucose-Capillary 248 (H) 65 - 99 mg/dL  Potassium     Status: Abnormal   Collection Time: 02/12/17  8:05 AM  Result Value Ref Range   Potassium 2.7 (LL) 3.5 - 5.1 mmol/L    Comment: CRITICAL RESULT CALLED TO, READ BACK BY AND VERIFIED WITH: R.BUENDIA,RN 0902 02/12/17 CLARK,S   Magnesium     Status: Abnormal   Collection Time: 02/12/17  8:05 AM  Result Value Ref Range   Magnesium 1.3 (L) 1.7 - 2.4 mg/dL    Current Facility-Administered Medications  Medication Dose Route Frequency Provider Last Rate Last Dose  . 0.9 %  sodium chloride infusion   Intravenous Continuous Radene Gunning, NP 100 mL/hr at 02/11/17 1412    . ARIPiprazole (ABILIFY) tablet 5 mg  5 mg Oral Daily Radene Gunning, NP   5 mg at 02/12/17 0818  . atorvastatin (LIPITOR) tablet 10 mg  10 mg Oral q1800 Radene Gunning, NP   10 mg at 02/11/17 1728  . carvedilol (COREG) tablet 12.5 mg  12.5 mg Oral BID Dyanne Carrel M, NP   12.5 mg at 02/11/17 2239  . clonazePAM (KLONOPIN) tablet 1 mg  1 mg Oral BID PRN Radene Gunning, NP      . DULoxetine (CYMBALTA) DR capsule 30 mg  30 mg Oral BID Radene Gunning, NP   30 mg at 02/12/17 0817  . enoxaparin (LOVENOX) injection 40 mg  40 mg Subcutaneous Q24H Radene Gunning, NP   40 mg at 02/11/17 1421  . feeding supplement (ENSURE ENLIVE) (ENSURE ENLIVE) liquid 237 mL  237 mL Oral BID BM Shah, Pratik D, DO      . fenofibrate tablet 160 mg  160 mg Oral Daily Radene Gunning, NP   160 mg at 02/12/17 0817  . gabapentin (NEURONTIN) tablet 600 mg  600 mg Oral TID Radene Gunning, NP   600 mg at  02/12/17 0818  . insulin aspart (novoLOG) injection 0-5 Units  0-5 Units Subcutaneous QHS Bodenheimer, Charles A, NP      . insulin aspart (novoLOG) injection 0-9 Units  0-9 Units Subcutaneous TID WC Bodenheimer, Charles A, NP   3 Units at 02/12/17 0809  . insulin glargine (LANTUS) injection 22 Units  22 Units Subcutaneous QHS Vertis Kelch, NP   22 Units at 02/11/17 2309  . lamoTRIgine (LAMICTAL) tablet 25 mg  25 mg Oral Daily Radene Gunning, NP   25 mg at 02/12/17 0818  . magnesium sulfate IVPB 2  g 50 mL  2 g Intravenous Q2H Nettey, Myrlene Broker D, MD      . methocarbamol (ROBAXIN) tablet 500 mg  500 mg Oral Q8H PRN Radene Gunning, NP   500 mg at 02/11/17 1420  . nabumetone (RELAFEN) tablet 500 mg  500 mg Oral BID Radene Gunning, NP   500 mg at 02/12/17 0817  . ondansetron (ZOFRAN) injection 4 mg  4 mg Intravenous Q6H PRN Radene Gunning, NP   4 mg at 02/11/17 1612  . potassium chloride 10 mEq in 100 mL IVPB  10 mEq Intravenous Q1 Hr x 4 Oretha Milch D, MD 100 mL/hr at 02/12/17 0919 10 mEq at 02/12/17 0919    Musculoskeletal: Strength & Muscle Tone: within normal limits Gait & Station: normal Patient leans: N/A  Psychiatric Specialty Exam: Physical Exam  Nursing note and vitals reviewed. Constitutional: He is oriented to person, place, and time. He appears well-developed and well-nourished.  HENT:  Head: Normocephalic and atraumatic.  Neck: Normal range of motion.  Respiratory: Effort normal.  Musculoskeletal: Normal range of motion.  Neurological: He is alert and oriented to person, place, and time.  Skin: No rash noted.  Psychiatric: His speech is normal and behavior is normal. Judgment and thought content normal. Cognition and memory are normal.    Review of Systems  Constitutional: Positive for weight loss (60 pounds over the past year.). Negative for chills, fever and malaise/fatigue.  Eyes: Negative for blurred vision.  Gastrointestinal: Negative for abdominal pain,  constipation, diarrhea, nausea and vomiting.    Blood pressure (!) 88/65, pulse 65, temperature 97.8 F (36.6 C), temperature source Oral, resp. rate 19, height '5\' 7"'$  (1.702 m), weight 55.2 kg (121 lb 9.6 oz), SpO2 97 %.Body mass index is 19.05 kg/m.  General Appearance: Well Groomed, Caucasian male with thinning hair and unshaved face in a hospital gown and lying in bed. NAD.   Eye Contact:  Good  Speech:  Clear and Coherent and Normal Rate  Volume:  Normal  Mood:  Okay  Affect:  Constricted  Thought Process:  Goal Directed and Linear  Orientation:  Full (Time, Place, and Person)  Thought Content:  Logical  Suicidal Thoughts:  No  Homicidal Thoughts:  No  Memory:  Immediate;   Good Recent;   Good Remote;   Good  Judgement:  Good  Insight:  Good  Psychomotor Activity:  Normal  Concentration:  Concentration: Good and Attention Span: Good  Recall:  Good  Fund of Knowledge:  Good  Language:  Good  Akathisia:  No  Handed:  Right  AIMS (if indicated):   N/A  Assets:  Communication Skills Desire for Improvement Financial Resources/Insurance Housing  ADL's:  Intact  Cognition:  WNL  Sleep:   Poor   Assessment:  VIVEK GREALISH is a 46 y.o. male who was admitted with hyperglycemia in the setting of medication noncompliance. He reports depressive symptoms and poorly managed anxiety secondary to work stress. He is agreeable to increasing Cymbalta for depressive symptoms and anxiety and restart Naltrexone for alcohol cravings.   Treatment Plan Summary: -Recommend increasing Cymbalta 30 mg BID to 60 mg q am and 30 mg q evening.  -Start Naltrexone 50 mg daily for alcoholism. Naltrexone use in setting of transaminitis. -Continue home medication Abilify 5 mg daily and Lamictal 25 mg daily for mood augmentation.  -Psychiatry will sign off on patient at this time. Please consult psychiatry again as needed.    Disposition: No evidence of  imminent risk to self or others at present.    Patient does not meet criteria for psychiatric inpatient admission.  Faythe Dingwall, DO 02/12/2017 9:50 AM

## 2017-02-13 DIAGNOSIS — F321 Major depressive disorder, single episode, moderate: Secondary | ICD-10-CM

## 2017-02-13 LAB — GLUCOSE, CAPILLARY
GLUCOSE-CAPILLARY: 202 mg/dL — AB (ref 65–99)
GLUCOSE-CAPILLARY: 231 mg/dL — AB (ref 65–99)
GLUCOSE-CAPILLARY: 59 mg/dL — AB (ref 65–99)
GLUCOSE-CAPILLARY: 69 mg/dL (ref 65–99)
GLUCOSE-CAPILLARY: 86 mg/dL (ref 65–99)
Glucose-Capillary: 125 mg/dL — ABNORMAL HIGH (ref 65–99)
Glucose-Capillary: 195 mg/dL — ABNORMAL HIGH (ref 65–99)
Glucose-Capillary: 186 mg/dL — ABNORMAL HIGH (ref 65–99)

## 2017-02-13 LAB — BASIC METABOLIC PANEL
ANION GAP: 7 (ref 5–15)
BUN: <5 mg/dL — ABNORMAL LOW (ref 6–20)
CHLORIDE: 98 mmol/L — AB (ref 101–111)
CO2: 31 mmol/L (ref 22–32)
CREATININE: 0.44 mg/dL — AB (ref 0.61–1.24)
Calcium: 8.6 mg/dL — ABNORMAL LOW (ref 8.9–10.3)
GFR calc non Af Amer: >60 mL/min (ref >60)
Glucose, Bld: 57 mg/dL — ABNORMAL LOW (ref 65–99)
POTASSIUM: 3.1 mmol/L — AB (ref 3.5–5.1)
SODIUM: 136 mmol/L (ref 135–145)

## 2017-02-13 MED ORDER — INSULIN ASPART 100 UNIT/ML ~~LOC~~ SOLN
0.0000 [IU] | Freq: Every day | SUBCUTANEOUS | Status: DC
  Administered 2017-02-13: 2 [IU] via SUBCUTANEOUS

## 2017-02-13 MED ORDER — INSULIN ASPART 100 UNIT/ML ~~LOC~~ SOLN
0.0000 [IU] | Freq: Three times a day (TID) | SUBCUTANEOUS | Status: DC
  Administered 2017-02-13: 3 [IU] via SUBCUTANEOUS
  Administered 2017-02-13: 5 [IU] via SUBCUTANEOUS

## 2017-02-13 MED ORDER — POTASSIUM CHLORIDE CRYS ER 20 MEQ PO TBCR
40.0000 meq | EXTENDED_RELEASE_TABLET | Freq: Once | ORAL | Status: AC
  Administered 2017-02-13: 40 meq via ORAL
  Filled 2017-02-13: qty 2

## 2017-02-13 MED ORDER — INSULIN GLARGINE 100 UNIT/ML ~~LOC~~ SOLN
22.0000 [IU] | Freq: Every day | SUBCUTANEOUS | Status: DC
  Administered 2017-02-13: 22 [IU] via SUBCUTANEOUS
  Filled 2017-02-13: qty 0.22

## 2017-02-13 MED ORDER — LOPERAMIDE HCL 2 MG PO CAPS
2.0000 mg | ORAL_CAPSULE | ORAL | Status: DC | PRN
  Administered 2017-02-13: 2 mg via ORAL
  Filled 2017-02-13: qty 1

## 2017-02-13 NOTE — Progress Notes (Signed)
PROGRESS NOTE  Terry Schultz ZOX:[id number] DOB: 11-05-1970 DOA: 02/11/2017 PCP: Cheron SchaumannVelazquez, Gretchen Y., MD  HPI/Recap of past 24 hours:  Terry Schultz is a 46 y.o. year old male with medical history significant for uncontrolled diabetes, diabetic polyneuropathy, alcohol abuse, chronic pancreatitis, hypertension, tobacco use, depression and anxiety   who presented on 02/11/2017 with hyperglycemia and was found to have glucose greater than 700, potassium 2.8 with initial anion gap of 24 and bicarb of 32, magnesium 1.6 concerning for DKA.Marland Kitchen.  Patient was given bolus fluids, repleted with IV potassium and magnesium and started on insulin drip  This morning he reports improved mood. Tolerating diet. No complaints otherwise  Assessment/Plan: Principal Problem:   MDD (major depressive disorder), single episode, moderate (HCC) Active Problems:   Anxiety disorder   Essential hypertension   Hypothyroidism   OSA (obstructive sleep apnea)   Uncontrolled type 2 diabetes mellitus with diabetic polyneuropathy, with long-term current use of insulin (HCC)   Depression with anxiety   Chronic back pain   Diabetic peripheral neuropathy (HCC)   Hyperglycemia, unspecified   Hypomagnesemia   Protein-calorie malnutrition, severe   Hypokalemia   Diarrhea   Hyperglycemia   DKA, secondary to Nonadherence to insulin therapy, resolved Type 2 DM, poorly controlled ( A1c 11/2016: 12.6) Anion gap closed.  Labile glucose after 27 U Lantus last night nadir of 57 this am. Interesting since patient states he takes Lantus 22 U twice daily. Patient is also eating 100% of meals Will decrease back to home dose of 22 U and monitor overnight. If no further hypoglycemic episodes likely discharge on 11/18 Monitor glucose closely, expect will need higher lantus dose tonight  Hypokalemia(improving) and hypomagnesemia (resolved), 2/2 to hyperglycemia  Oral potassium repletion BMP in am  Chronic diarrhea States  happens in setting of missing insulin dose Does report waking up in bowel movements at home Achieve euglycemia and reevaulate  Alcohol abuse CIWA protocol Naltrexone added per psych recs  HTN, controlled Coreg  Depression/Anxiety Patient admits to poor mood, and component in poor adherence to [facility], recs: naltrexone ( for alcoholism), increased cymbalta dosing Home cymbalta, abilify and klonopin  Diabetic peripheral neuropathy Gabapentin  Code Status: Full Code  Family Communication: no family at bedside   Disposition Plan: euglycemia on oral regimen, [facility]   [facility]   Procedures:  None   Antimicrobials:  None   Cultures:  None  DVT prophylaxis: Heparin   Objective: Vitals:   02/13/17 0504 02/13/17 0830 02/13/17 0852 02/13/17 1300  BP: (!) 127/96 125/88  101/79  Pulse: 63  74   Resp: 18     Temp: 98.3 F (36.8 C) 97.6 F (36.4 C)  98.3 F (36.8 C)  TempSrc: Oral Oral  Oral  SpO2: 99% 100%  100%  Weight: 55.2 kg (121 lb 12.8 oz)     Height:        Intake/Output Summary (Last 24 hours) at 02/13/2017 1354 Last data filed at 02/13/2017 0836 Gross per 24 hour  Intake 720 ml  Output 300 ml  Net 420 ml   Filed Weights   02/11/17 1954 02/12/17 0357 02/13/17 0504  Weight: 55.2 kg (121 lb 12.8 oz) 55.2 kg (121 lb 9.6 oz) 55.2 kg (121 lb 12.8 oz)    Exam:   General: Lying in bed comfortably, in no distress  Skin: Clean, dry, intact  Psychiatry: appropriate affect, normal mood   Data Reviewed: CBC: Recent Labs  Lab 02/11/17 [phone]  02/11/17 0941  WBC 3.7*  --   HGB 12.9* 12.9*  HCT 37.1* 38.0*  MCV 92.1  --   PLT 106*  --    Basic Metabolic Panel: Recent Labs  Lab 02/11/17 0925  02/11/17 1243 02/11/17 1611 02/11/17 2045 02/12/17 0538 02/12/17 0805 02/12/17 1441 02/13/17 0514  NA 132*   < > 135 134* 135 133*  --   --  136  K 2.8*   < > 3.2* 2.9* 3.0* 2.5* 2.7* 3.4* 3.1*  CL 82*   < >  89* 91* 92* 92*  --   --  98*  CO2 26  --  27 28 33* 32  --   --  31  GLUCOSE 753*   < > 487* 279* 114* 259*  --   --  57*  BUN <5*   < > <5* <5* <5* <5*  --   --  <5*  CREATININE 0.76   < > 0.64 0.54* 0.50* 0.54*  --   --  0.44*  CALCIUM 8.7*  --  7.9* 7.8* 7.7* 7.7*  --   --  8.6*  MG 1.6*  --   --   --   --   --  1.3* 2.2  --    < > = values in this interval not displayed.   GFR: Estimated Creatinine Clearance: 88.2 mL/min (A) (by C-G formula based on SCr of 0.44 mg/dL (L)). Liver Function Tests: Recent Labs  Lab 02/11/17 0925  AST 134*  ALT 66*  ALKPHOS 203*  BILITOT 1.2  PROT 6.5  ALBUMIN 4.0   No results for input(s): LIPASE, AMYLASE in the last 168 hours. No results for input(s): AMMONIA in the last 168 hours. Coagulation Profile: No results for input(s): INR, PROTIME in the last 168 hours. Cardiac Enzymes: No results for input(s): CKTOTAL, CKMB, CKMBINDEX, TROPONINI in the last 168 hours. BNP (last 3 results) No results for input(s): PROBNP in the last 8760 hours. HbA1C: No results for input(s): HGBA1C in the last 72 hours. CBG: Recent Labs  Lab 02/13/17 0006 02/13/17 0732 02/13/17 0755 02/13/17 0829 02/13/17 1145  GLUCAP 86 59* 69 125* 202*   Lipid Profile: No results for input(s): CHOL, HDL, LDLCALC, TRIG, CHOLHDL, LDLDIRECT in the last 72 hours. Thyroid Function Tests: No results for input(s): TSH, T4TOTAL, FREET4, T3FREE, THYROIDAB in the last 72 hours. Anemia Panel: No results for input(s): VITAMINB12, FOLATE, FERRITIN, TIBC, IRON, RETICCTPCT in the last 72 hours. Urine analysis:    Component Value Date/Time   COLORURINE STRAW (A) 02/11/2017 1128   APPEARANCEUR CLEAR 02/11/2017 1128   LABSPEC 1.023 02/11/2017 1128   PHURINE 6.0 02/11/2017 1128   GLUCOSEU >=500 (A) 02/11/2017 1128   HGBUR MODERATE (A) 02/11/2017 1128   BILIRUBINUR NEGATIVE 02/11/2017 1128   KETONESUR 5 (A) 02/11/2017 1128   PROTEINUR NEGATIVE 02/11/2017 1128   UROBILINOGEN 0.2  05/31/2009 1242   NITRITE NEGATIVE 02/11/2017 1128   LEUKOCYTESUR NEGATIVE 02/11/2017 1128   Sepsis Labs: @LABRCNTIP (procalcitonin:4,lacticidven:4)  )No results found for this or any previous visit (from the past 240 hour(s)).    Studies: No results found.  Scheduled Meds: . ARIPiprazole  5 mg Oral Daily  . atorvastatin  10 mg Oral q1800  . carvedilol  12.5 mg Oral BID  . DULoxetine  30 mg Oral QPM  . DULoxetine  60 mg Oral q morning - 10a  . enoxaparin (LOVENOX) injection  40 mg Subcutaneous Q24H  . feeding supplement (GLUCERNA SHAKE)  237 mL  Oral BID BM  . fenofibrate  160 mg Oral Daily  . gabapentin  600 mg Oral TID  . insulin aspart  0-15 Units Subcutaneous TID WC  . insulin aspart  0-5 Units Subcutaneous QHS  . insulin glargine  22 Units Subcutaneous QHS  . lamoTRIgine  25 mg Oral Daily  . nabumetone  500 mg Oral BID  . naltrexone  50 mg Oral Daily    Continuous Infusions: . sodium chloride 100 mL/hr at 02/11/17 1412     LOS: 2 days     Laverna Peace, MD [facility] Pager [phone]  If 7PM-7AM, please contact night-coverage [email or web address] Password [email or web address] 02/13/2017, 1:54 PM

## 2017-02-14 LAB — GLUCOSE, CAPILLARY
GLUCOSE-CAPILLARY: 132 mg/dL — AB (ref 65–99)
GLUCOSE-CAPILLARY: 108 mg/dL — AB (ref 65–99)
Glucose-Capillary: 195 mg/dL — ABNORMAL HIGH (ref 65–99)

## 2017-02-14 LAB — BASIC METABOLIC PANEL
Anion gap: 7 (ref 5–15)
BUN: <5 mg/dL — ABNORMAL LOW (ref 6–20)
CHLORIDE: 101 mmol/L (ref 101–111)
CO2: 29 mmol/L (ref 22–32)
Calcium: 8.9 mg/dL (ref 8.9–10.3)
Creatinine, Ser: 0.37 mg/dL — ABNORMAL LOW (ref 0.61–1.24)
GFR calc non Af Amer: >60 mL/min (ref >60)
Glucose, Bld: 128 mg/dL — ABNORMAL HIGH (ref 65–99)
POTASSIUM: 3.4 mmol/L — AB (ref 3.5–5.1)
SODIUM: 137 mmol/L (ref 135–145)

## 2017-02-14 MED ORDER — POTASSIUM CHLORIDE CRYS ER 20 MEQ PO TBCR
40.0000 meq | EXTENDED_RELEASE_TABLET | Freq: Once | ORAL | Status: AC
  Administered 2017-02-14: 40 meq via ORAL
  Filled 2017-02-14: qty 2

## 2017-02-14 MED ORDER — GABAPENTIN 600 MG PO TABS: 600.0000 mg | ORAL_TABLET | Freq: Three times a day (TID) | ORAL | 1 refills | Status: AC

## 2017-02-14 MED ORDER — POTASSIUM CHLORIDE CRYS ER 20 MEQ PO TBCR: 40.0000 meq | EXTENDED_RELEASE_TABLET | Freq: Every day | ORAL | 1 refills | Status: AC

## 2017-02-14 MED ORDER — POTASSIUM CHLORIDE CRYS ER 20 MEQ PO TBCR: 40.0000 meq | EXTENDED_RELEASE_TABLET | Freq: Once | ORAL | 0 refills | Status: DC

## 2017-02-14 MED ORDER — NALTREXONE HCL 50 MG PO TABS: 50.0000 mg | ORAL_TABLET | Freq: Every day | ORAL | 1 refills | Status: AC

## 2017-02-14 MED ORDER — DULOXETINE HCL 30 MG PO CPEP: ORAL_CAPSULE | ORAL | 1 refills | Status: AC

## 2017-02-14 NOTE — Discharge Instructions (Signed)
Diabetes Mellitus and Food It is important for you to manage your blood sugar (glucose) level. Your blood glucose level can be greatly affected by what you eat. Eating healthier foods in the appropriate amounts throughout the day at about the same time each day will help you control your blood glucose level. It can also help slow or prevent worsening of your diabetes mellitus. Healthy eating may even help you improve the level of your blood pressure and reach or maintain a healthy weight. General recommendations for healthful eating and cooking habits include:  Eating meals and snacks regularly. Avoid going long periods of time without eating to lose weight.  Eating a diet that consists mainly of plant-based foods, such as fruits, vegetables, nuts, legumes, and whole grains.  Using low-heat cooking methods, such as baking, instead of high-heat cooking methods, such as deep frying.  Work with your dietitian to make sure you understand how to use the Nutrition Facts information on food labels. How can food affect me? Carbohydrates Carbohydrates affect your blood glucose level more than any other type of food. Your dietitian will help you determine how many carbohydrates to eat at each meal and teach you how to count carbohydrates. Counting carbohydrates is important to keep your blood glucose at a healthy level, especially if you are using insulin or taking certain medicines for diabetes mellitus. Alcohol Alcohol can cause sudden decreases in blood glucose (hypoglycemia), especially if you use insulin or take certain medicines for diabetes mellitus. Hypoglycemia can be a life-threatening condition. Symptoms of hypoglycemia (sleepiness, dizziness, and disorientation) are similar to symptoms of having too much alcohol. If your health care provider has given you approval to drink alcohol, do so in moderation and use the following guidelines:  Women should not have more than one drink per day, and men  should not have more than two drinks per day. One drink is equal to: ? 12 oz of beer. ? 5 oz of wine. ? 1 oz of hard liquor.  Do not drink on an empty stomach.  Keep yourself hydrated. Have water, diet soda, or unsweetened iced tea.  Regular soda, juice, and other mixers might contain a lot of carbohydrates and should be counted.  What foods are not recommended? As you make food choices, it is important to remember that all foods are not the same. Some foods have fewer nutrients per serving than other foods, even though they might have the same number of calories or carbohydrates. It is difficult to get your body what it needs when you eat foods with fewer nutrients. Examples of foods that you should avoid that are high in calories and carbohydrates but low in nutrients include:  Trans fats (most processed foods list trans fats on the Nutrition Facts label).  Regular soda.  Juice.  Candy.  Sweets, such as cake, pie, doughnuts, and cookies.  Fried foods.  What foods can I eat? Eat nutrient-rich foods, which will nourish your body and keep you healthy. The food you should eat also will depend on several factors, including:  The calories you need.  The medicines you take.  Your weight.  Your blood glucose level.  Your blood pressure level.  Your cholesterol level.  You should eat a variety of foods, including:  Protein. ? Lean cuts of meat. ? Proteins low in saturated fats, such as fish, egg whites, and beans. Avoid processed meats.  Fruits and vegetables. ? Fruits and vegetables that may help control blood glucose levels, such as apples,   mangoes, and yams.  Dairy products. ? Choose fat-free or low-fat dairy products, such as milk, yogurt, and cheese.  Grains, bread, pasta, and rice. ? Choose whole grain products, such as multigrain bread, whole oats, and brown rice. These foods may help control blood pressure.  Fats. ? Foods containing healthful fats, such as  nuts, avocado, olive oil, canola oil, and fish.  Does everyone with diabetes mellitus have the same meal plan? Because every person with diabetes mellitus is different, there is not one meal plan that works for everyone. It is very important that you meet with a dietitian who will help you create a meal plan that is just right for you. This information is not intended to replace advice given to you by your health care provider. Make sure you discuss any questions you have with your health care provider. Document Released: 12/11/2004 Document Revised: 08/22/2015 Document Reviewed: 02/10/2013 [employer] Interactive Patient Education  2017 [employer]  

## 2017-02-14 NOTE — Discharge Summary (Signed)
Discharge Summary  Terry Schultz:[id number] DOB: November 27, 1970  PCP: Loraine Leriche., MD  Admit date: 02/11/2017 Discharge date: 02/14/2017  Time spent: 25 minutes  Admitted From: Home Disposition:  Home  Recommendations for Outpatient Follow-up:  1. Follow up with PCP in 1week 2. Please obtain BMP in one week to check potassium 3. Please note the following medication changes: Naltrexone 50 mg and Cymbalta 60 mg q am, 30 mg q evening  Home Health:NO Equipment/Devices:None   Discharge Diagnoses:  Active Hospital Problems   Diagnosis Date Noted  . MDD (major depressive disorder), single episode, moderate ([place]) 02/12/2017  . Hyperglycemia 02/11/2017  . Hypokalemia 01/21/2017  . Diarrhea 01/21/2017  . Protein-calorie malnutrition, severe 01/20/2017  . Chronic back pain 12/05/2016  . Depression with anxiety 12/05/2016  . Hypomagnesemia 05/29/2016  . Hyperglycemia, unspecified 05/27/2016  . Diabetic peripheral neuropathy ([place]) 05/21/2016  . Uncontrolled type 2 diabetes mellitus with diabetic polyneuropathy, with long-term current use of insulin ([place]) 09/30/2013  . Hypothyroidism 09/30/2013  . OSA (obstructive sleep apnea) 09/30/2013  . Anxiety disorder 09/30/2013  . Essential hypertension 09/30/2013    Resolved Hospital Problems  No resolved problems to display.    Discharge Condition: Stable  CODE STATUS:Full Diet recommendation: Heart Healthy    Vitals:   02/14/17 0850 02/14/17 0914  BP: 106/84   Pulse:  77  Resp:    Temp: (!) 97.5 F (36.4 C)   SpO2:      History of present illness:  Terry Schultz is a 46 y.o. year old male with medical history significant for uncontrolled diabetes, diabetic polyneuropathy, alcohol abuse, chronic pancreatitis, hypertension, tobacco use, depression and anxiety   who presented on 02/11/2017 with hyperglycemia and was found to have glucose greater than 700, potassium 2.8 with initial anion gap of 24 and bicarb of  32, magnesium 1.6 consistent with DKA.  Patient was given bolus fluids, repleted with IV potassium and magnesium and started on insulin drip on admission.   Remaining hospital course addressed in problem based format:     Hospital Course:  Principal Problem:   MDD (major depressive disorder), single episode, moderate ([facility]) Active Problems:   Anxiety disorder   Essential hypertension   Hypothyroidism   OSA (obstructive sleep apnea)   Uncontrolled type 2 diabetes mellitus with diabetic polyneuropathy, with long-term current use of insulin ([facility])   Depression with anxiety   Chronic back pain   Diabetic peripheral neuropathy ([facility])   Hyperglycemia, unspecified   Hypomagnesemia   Protein-calorie malnutrition, severe   Hypokalemia   Diarrhea   Hyperglycemia   DKA secondary to patient nonadherence with insulin regimen and diet, resolved Hypokalemia and Hypomagnesemia, resolved Patient admits poor mood has been contributing to him not taking his insulin as prescribed in addition to forgetting to take his insulin.  On presentation patient was found to have a glucose greater than 700 with anion gap 24 potassium 2.7.  Insulin drip was initiated, aggressive fluid resuscitation with normal saline and IV potassium and magnesium supplementation.  Patient was successfully transitioned to home dose of Lantus 22 units BID and remained euglycemic during hospital course.  Patient counseled extensively on importance of maintaining insulin regimen to prevent further hospital admissions. Continued on home oral potassium supplementation on discharge   Chronic diarrhea Chronic diarrhea which patient states is typically exacerbated in the setting of his hyperglycemia.  Resolved with initiation of insulin during hospital course  Alcohol abuse Psychiatry was consulted and recommended naltrexone.  Patient states he  is no longer a heavy drinker. Monitored on CIWA during hospital course with no signs of withdrawal.      Depression/anxiety Psychiatry was consulted during hospital course made the following changes: Cymbalta 60 mg a.m., 30 mg p.m.  Naltrexone for alcoholism.  And continuation of the remaining home medications: cymbalta, abilify, klonopin.  Patient was added risk of hurting self or others.  Diabetic peripheral neuropathy Controlled on home gabpentin  Hypertension Controlled on home coreg  Consultations:  Psychiatry, Dr. Buford Dresser   Procedures/Studies: Dg Chest 2 View  Result Date: 01/19/2017 CLINICAL DATA:  Weakness and dizziness. EXAM: CHEST  2 VIEW COMPARISON:  Chest x-ray 05/27/2016. FINDINGS: Mediastinum hilar structures normal. Cardiomegaly with normal pulmonary vascularity. Mild right base subsegmental atelectasis. No pleural effusion or pneumothorax. Stable elevation right hemidiaphragm. IMPRESSION: Mild right base subsegmental atelectasis. Stable elevation right hemidiaphragm. Electronically Signed   By: Marcello Moores  Register   On: 01/19/2017 15:42   Ct Head Wo Contrast  Result Date: 01/19/2017 CLINICAL DATA:  Dizziness and fall. EXAM: CT HEAD WITHOUT CONTRAST TECHNIQUE: Contiguous axial images were obtained from the base of the skull through the vertex without intravenous contrast. COMPARISON:  CT scan of Aug 27, 2016. FINDINGS: Brain: No evidence of acute infarction, hemorrhage, hydrocephalus, extra-axial collection or mass lesion/mass effect. Vascular: No hyperdense vessel or unexpected calcification. Skull: Normal. Negative for fracture or focal lesion. Sinuses/Orbits: No acute finding. Other: None. IMPRESSION: Normal head CT. Electronically Signed   By: Marijo Conception, M.D.   On: 01/19/2017 14:02    (Echo, Carotid, EGD, Colonoscopy, ERCP)   Discharge Exam: BP 106/84 (BP Location: Right Arm)   Pulse 77   Temp (!) 97.5 F (36.4 C) (Oral)   Resp 18   Ht _0  (1.702 m)   Wt 55.2 kg (121 lb 12.8 oz)   SpO2 99%   BMI 19.08 kg/m   General: sitting up in bed  comfortably Cardiovascular: regular rate and rhythm, no peripheral edema  Respiratory: clear breath sounds, no wheezes or rhonchi Psych: appropriate affect and mood   Discharge Instructions You were cared for by a hospitalist during your hospital stay. If you have any questions about your discharge medications or the care you received while you were in the hospital after you are discharged, you can call the unit and asked to speak with the hospitalist on call if the hospitalist that took care of you is not available. Once you are discharged, your primary care physician will handle any further medical issues. Please note that NO REFILLS for any discharge medications will be authorized once you are discharged, as it is imperative that you return to your primary care physician (or establish a relationship with a primary care physician if you do not have one) for your aftercare needs so that they can reassess your need for medications and monitor your lab values.  Discharge Instructions    Diet - low sodium heart healthy   Complete by:  As directed    Increase activity slowly   Complete by:  As directed      Allergies as of 02/14/2017      Reactions   Metformin Other (See Comments)   Reported lactic acidosis by hospital record   Morphine Itching   Penicillins Itching   Has patient had a PCN reaction causing immediate rash, facial/tongue/throat swelling, SOB or lightheadedness with hypotension: No Has patient had a PCN reaction causing severe rash involving mucus membranes or skin necrosis: No Has patient had a  PCN reaction that required hospitalization: No Has patient had a PCN reaction occurring within the last 10 years: Yes If all of the above answers are "NO", then may proceed with Cephalosporin use.   Amoxicillin Rash   Meperidine Itching      Medication List    STOP taking these medications   potassium chloride SA 20 MEQ tablet Commonly known as:  K-DUR,KLOR-CON     TAKE these  medications   ARIPiprazole 5 MG tablet Commonly known as:  ABILIFY Take 5 mg by mouth daily.   atorvastatin 10 MG tablet Commonly known as:  LIPITOR Take 10 mg daily by mouth.   BAYER MICROLET LANCETS lancets 1 each by Other route 4 (four) times daily. Puncture skin   blood glucose meter kit and supplies Dispense based on patient and insurance preference. Use up to four times daily as directed. (FOR ICD-9 250.00, 250.01).   carvedilol 12.5 MG tablet Commonly known as:  COREG Take 12.5 mg by mouth 2 (two) times daily.   clonazePAM 1 MG tablet Commonly known as:  KLONOPIN Take 1 mg 2 (two) times daily as needed by mouth.   Continuous Blood Gluc Sensor Misc 1 each as directed by Does not apply route. Use as directed every 10 days. May dispense FreeStyle [employer] or similar.   DULoxetine 30 MG capsule Commonly known as:  CYMBALTA 60 mg(2 tabs) in morning, 30 mg(1 tab) in the evening What changed:    how much to take  how to take this  when to take this  additional instructions   fenofibrate 160 MG tablet Take 160 mg by mouth.   gabapentin 600 MG tablet Commonly known as:  NEURONTIN Take 1 tablet (600 mg total) 3 (three) times daily by mouth.   glucose blood test strip Commonly known as:  BAYER CONTOUR TEST 1 each by Other route 4 (four) times daily.   hydrOXYzine 50 MG capsule Commonly known as:  VISTARIL Take 50 mg 2 (two) times daily as needed by mouth.   Insulin Glargine 100 UNIT/ML Solostar Pen Commonly known as:  LANTUS SOLOSTAR Inject 22 Units into the skin daily.   insulin lispro 100 UNIT/ML injection Commonly known as:  HUMALOG Inject 0-0.1 mLs (0-10 Units total) into the skin 3 (three) times daily before meals. Per sliding scale What changed:    how much to take  when to take this  additional instructions   Insulin Pen Needle 31G X 5 MM Misc Use daily with lantus solostar pen   Insulin Syringe-Needle U-100 30G X 1/2" 0.3 ML  Misc 1 Syringe by Does not apply route 3 (three) times daily as needed (with meals).   lamoTRIgine 25 MG tablet Commonly known as:  LAMICTAL Take 25 mg by mouth daily.   loperamide 2 MG capsule Commonly known as:  IMODIUM Take 1 capsule (2 mg total) by mouth as needed for diarrhea or loose stools.   methocarbamol 500 MG tablet Commonly known as:  ROBAXIN Take 500 mg by mouth 2 (two) times daily as needed.   nabumetone 500 MG tablet Commonly known as:  RELAFEN Take 500 mg by mouth 2 (two) times daily.   naltrexone 50 MG tablet Commonly known as:  DEPADE Take 1 tablet (50 mg total) daily by mouth.      Allergies  Allergen Reactions  . Metformin Other (See Comments)    Reported lactic acidosis by hospital record  . Morphine Itching  . Penicillins Itching    Has  patient had a PCN reaction causing immediate rash, facial/tongue/throat swelling, SOB or lightheadedness with hypotension: No Has patient had a PCN reaction causing severe rash involving mucus membranes or skin necrosis: No Has patient had a PCN reaction that required hospitalization: No Has patient had a PCN reaction occurring within the last 10 years: Yes If all of the above answers are "NO", then may proceed with Cephalosporin use.  Marland Kitchen Amoxicillin Rash  . Meperidine Itching      The results of significant diagnostics from this hospitalization (including imaging, microbiology, ancillary and laboratory) are listed below for reference.    Significant Diagnostic Studies: Dg Chest 2 View  Result Date: 01/19/2017 CLINICAL DATA:  Weakness and dizziness. EXAM: CHEST  2 VIEW COMPARISON:  Chest x-ray 05/27/2016. FINDINGS: Mediastinum hilar structures normal. Cardiomegaly with normal pulmonary vascularity. Mild right base subsegmental atelectasis. No pleural effusion or pneumothorax. Stable elevation right hemidiaphragm. IMPRESSION: Mild right base subsegmental atelectasis. Stable elevation right hemidiaphragm.  Electronically Signed   By: Marcello Moores  Register   On: 01/19/2017 15:42   Ct Head Wo Contrast  Result Date: 01/19/2017 CLINICAL DATA:  Dizziness and fall. EXAM: CT HEAD WITHOUT CONTRAST TECHNIQUE: Contiguous axial images were obtained from the base of the skull through the vertex without intravenous contrast. COMPARISON:  CT scan of Aug 27, 2016. FINDINGS: Brain: No evidence of acute infarction, hemorrhage, hydrocephalus, extra-axial collection or mass lesion/mass effect. Vascular: No hyperdense vessel or unexpected calcification. Skull: Normal. Negative for fracture or focal lesion. Sinuses/Orbits: No acute finding. Other: None. IMPRESSION: Normal head CT. Electronically Signed   By: Marijo Conception, M.D.   On: 01/19/2017 14:02    Microbiology: No results found for this or any previous visit (from the past 240 hour(s)).   Labs: Basic Metabolic Panel: Recent Labs  Lab 02/11/17 0925  02/11/17 1611 02/11/17 2045 02/12/17 0538 02/12/17 0805 02/12/17 1441 02/13/17 0514 02/14/17 0652  NA 132*   < > 134* 135 133*  --   --  136 137  K 2.8*   < > 2.9* 3.0* 2.5* 2.7* 3.4* 3.1* 3.4*  CL 82*   < > 91* 92* 92*  --   --  98* 101  CO2 26   < > 28 33* 32  --   --  31 29  GLUCOSE 753*   < > 279* 114* 259*  --   --  57* 128*  BUN <5*   < > <5* <5* <5*  --   --  <5* <5*  CREATININE 0.76   < > 0.54* 0.50* 0.54*  --   --  0.44* 0.37*  CALCIUM 8.7*   < > 7.8* 7.7* 7.7*  --   --  8.6* 8.9  MG 1.6*  --   --   --   --  1.3* 2.2  --   --    < > = values in this interval not displayed.   Liver Function Tests: Recent Labs  Lab 02/11/17 0925  AST 134*  ALT 66*  ALKPHOS 203*  BILITOT 1.2  PROT 6.5  ALBUMIN 4.0   No results for input(s): LIPASE, AMYLASE in the last 168 hours. No results for input(s): AMMONIA in the last 168 hours. CBC: Recent Labs  Lab 02/11/17 0925 02/11/17 0941  WBC 3.7*  --   HGB 12.9* 12.9*  HCT 37.1* 38.0*  MCV 92.1  --   PLT 106*  --    Cardiac Enzymes: No results for  input(s): CKTOTAL, CKMB, CKMBINDEX, TROPONINI  in the last 168 hours. BNP: BNP (last 3 results) No results for input(s): BNP in the last 8760 hours.  ProBNP (last 3 results) No results for input(s): PROBNP in the last 8760 hours.  CBG: Recent Labs  Lab 02/13/17 2009 02/13/17 2144 02/14/17 0036 02/14/17 0519 02/14/17 0820  GLUCAP 186* 231* 195* 132* 108*       Signed:  Desiree Hane, MD [facility] 02/14/2017, 9:35 AM

## 2017-02-14 NOTE — Care Management Note (Signed)
Case Management Note  Patient Details  Name: Terry NeedyRobert F Rosengrant MRN: [id number] Date of Birth: 1970-08-06  Subjective/Objective:                 Patient with order to DC to home today. Chart reviewed. No Home Health or Equipment needs, no unacknowledged Case Management consults or medication needs identified at the time of this note. Plan for DC to home.  CM signing off. If new needs arise today prior to discharge, please call Lawerance SabalDebbie Tion Tse RN CM at [phone].   Action/Plan:   Expected Discharge Date:  02/14/17               Expected Discharge Plan:  Home/Self Care  In-House Referral:     Discharge planning Services  CM Consult  Post Acute Care Choice:    Choice offered to:     DME Arranged:    DME Agency:     HH Arranged:    HH Agency:     Status of Service:  Completed, signed off  If discussed at [employer] Length of Stay Meetings, dates discussed:    Additional Comments:  Lawerance SabalDebbie Cris Talavera, RN 02/14/2017, 10:11 AM

## 2017-02-15 ENCOUNTER — Encounter: Payer: Self-pay | Admitting: Internal Medicine

## 2017-02-15 ENCOUNTER — Ambulatory Visit: Payer: Self-pay | Admitting: *Deleted

## 2017-02-15 DIAGNOSIS — E1165 Type 2 diabetes mellitus with hyperglycemia: Secondary | ICD-10-CM

## 2017-02-15 NOTE — Progress Notes (Signed)
Pt brought to RN clinic by HR supervisor Terry OxfordElizabeth Schultz. Per Terry Schultz, prior to pt's arrival to work, he had left a VM reporting he was being d/c'd from hospital this am and would be in at 11am. HR wanted to confirm d/c date. Confirmed as 11/18. Upon discussion with pt, he reports being d/c'd last night and had not picked up his medications from pharmacy because his car was at work all weekend.   CBG at d/c was 108. Pt reports it was 470 [facility]. Sts he ate breakfast without checking his level and dosed afterwards. Reports he took 25units of Lantus and 12 units Humalog at 0800 today. Asked pt to recheck level with meter when he gets to desk as he does not have the reader on his person, and to call RN back with updated level since insulins were taken 2 hours ago.   Instructed pt to call PCP for hospital d/c f/u OV on either Friday or Monday of next week due to the Thanksgiving holiday. He reports he will. Reminded pt that the office could decide to terminate his services if continues to no-show to appts like he did last week. He reports understanding.   [facility] failure to maintain contact. He reports they did not leave messages, however their notes report otherwise. He does not appear concerned with being removed.   Email sent to pt with appt time for OV with Terry Schultz tomorrow at her request. Pt responded accepting appt time and with report of cbg of 256 at 1100.

## 2017-02-16 ENCOUNTER — Ambulatory Visit: Payer: Self-pay | Admitting: Registered Nurse

## 2017-02-16 VITALS — BP 75/54 | HR 101 | Temp 98.6°F

## 2017-02-16 DIAGNOSIS — J00 Acute nasopharyngitis [common cold]: Secondary | ICD-10-CM

## 2017-02-16 DIAGNOSIS — F331 Major depressive disorder, recurrent, moderate: Secondary | ICD-10-CM

## 2017-02-16 DIAGNOSIS — E1165 Type 2 diabetes mellitus with hyperglycemia: Secondary | ICD-10-CM

## 2017-02-16 DIAGNOSIS — E876 Hypokalemia: Secondary | ICD-10-CM

## 2017-02-16 MED ORDER — SALINE SPRAY 0.65 % NA SOLN: 2.0000 | NASAL | 0 refills | Status: AC

## 2017-02-16 MED ORDER — FLUTICASONE PROPIONATE 50 MCG/ACT NA SUSP: 1.0000 | Freq: Two times a day (BID) | NASAL | 0 refills | Status: AC

## 2017-02-16 NOTE — Progress Notes (Signed)
Subjective:    Patient ID: Terry Schultz, male    DOB: [birth year], 46 y.o.   MRN: [id number]  46y/o caucasian divorced male established patient presents for f/u after hospital d/c on Sunday 02/14/17 admitted 02/11/2017 via ambulance transport to ER from work. Cbg was 470 yesterday morning per patient. Got it down to 148 by noon with insulin doses that he had not taken that am. Currently cbg 447. Has not taken insulin or other meds this am. "Working through" eating breakfast.   Has not yet made appt for 1 week f/u with pcp and lab recheck. Instructed pt again to do this asap. Per previous discussions with Inetta Fermoina, NP on this subject, will plan on drawing basic metabolic panel and magnesium in clinic on Monday 11/26 if pt does not f/u as instructed due to his frequent electrolyte abnormalities in the setting of hyperglycemia and medication noncompliance.   Pt reports diarrhea has improved slightly. "I didn't have any problems last night," whereas he often has fecal incontinence at night. Had Immodium Rx sent to pharmacy after last hospital admission but never picked up despite multiple instructions to do so. States he is taking OTC immodium intermittently.  Last BM this morning soft tan.  Has not picked up new Rxs sent to pharmacy after this recent hospital discharge: DULoxetine . gabapentin . naltrexone . potassium chloride SA. States he is planning to pick up meds this evening after his AA meeting at [address], [place], [place]. Reports he has been going to [employer] meetings every Tu, W, Th "since getting [place] from [place] however in psych notes from hospital admission, pt reported he had not attended any meetings.  BP low this morning. Pt sts he has not had any po intake today except coffee. Encouraged to drink plenty of water to increase BP this am. Pt asymptomatic with BP.   States going to [place], [place] to celebrate Thanksgiving with children and brother.  Both children in  college at [employer] previously lived in [place], [place] with mother.  Denied HI/SI PHQ-9 unchanged from last week.  States attending AA meetings at [address], [place] [employer] tonight.  Will pick up medications tonight has Kdur at home.  Last BM this am yellow/tan formed soft.  Denied blood.  Had coffee this am.  Yesterday at cereal breakfast, [employer] sandwich lunch and grilled cheese for dinner.      Review of Systems     Objective:   Physical Exam Constitutional: He is oriented to person, place, and time. Vital signs are normal. He appears well-developed and well-nourished. He is active and cooperative.  Non-toxic appearance. He does not have a sickly appearance. He does not appear ill. No distress.  HENT:  Head: Normocephalic and atraumatic.  Right Ear: Hearing, external ear and ear canal normal. A middle ear effusion is present.  Left Ear: Hearing, external ear and ear canal normal. A middle ear effusion is present.  Nose: No mucosal edema, rhinorrhea, nose lacerations, sinus tenderness, nasal deformity, septal deviation or nasal septal hematoma. No epistaxis.  No foreign bodies. Right sinus exhibits no maxillary sinus tenderness and no frontal sinus tenderness. Left sinus exhibits no maxillary sinus tenderness and no frontal sinus tenderness.  Mouth/Throat: Uvula is midline and mucous membranes are normal. Mucous membranes are not pale, not dry and not cyanotic. He does not have dentures. No oral lesions. No trismus in the jaw. Normal dentition. No dental abscesses, uvula swelling, lacerations or dental caries. No  oropharyngeal exudate or tonsillar abscesses.  Bilateral TMs air fluid level clear, posterior oropharyngeal edema and erythema present Eyes: Conjunctivae, EOM and lids are normal. Pupils are equal, round, and reactive to light. Right eye exhibits no chemosis, no discharge, no exudate and no hordeolum. No foreign body present in the right eye. Left eye exhibits no  chemosis, no discharge, no exudate and no hordeolum. No foreign body present in the left eye. Right conjunctiva is not injected. Right conjunctiva has no hemorrhage. Left conjunctiva is not injected. Left conjunctiva has no hemorrhage. No scleral icterus. Right eye exhibits normal extraocular motion and no nystagmus. Left eye exhibits normal extraocular motion and no nystagmus. Right pupil is round and reactive. Left pupil is round and reactive. Pupils are equal.  Wearing glasses  Neck: Trachea normal, normal range of motion and phonation normal. Neck supple. No tracheal tenderness, no spinous process tenderness and no muscular tenderness present. No neck rigidity. No tracheal deviation, no edema, no erythema and normal range of motion present. No thyroid mass and no thyromegaly present.  Cardiovascular: Normal rate, regular rhythm, S1 normal, S2 normal, normal heart sounds and intact distal pulses. PMI is not displaced. Exam reveals no gallop and no friction rub.  No murmur heard. Pulses:      Radial pulses are 2+ on the right side, and 2+ on the left side.  Pulmonary/Chest: Effort normal and breath sounds normal. No accessory muscle usage or stridor. No respiratory distress. He has no decreased breath sounds. He has no wheezes. He has no rhonchi. He has no rales.  No cough observed in exam room; spoke full sentences without difficulty  Abdominal: Soft. Normal appearance. He exhibits no shifting dullness, no distension, no pulsatile liver, no fluid wave, no abdominal bruit, no ascites, no pulsatile midline mass and no mass. Bowel sounds are decreased. There is no hepatosplenomegaly. There is no tenderness. There is no rigidity, no rebound, no guarding, no CVA tenderness, no tenderness at McBurney's point and negative Murphy's sign. Hernia confirmed negative in the ventral area.  Dull to percussion x 4 quads; hypoactive bowel sounds x 4 quads  Musculoskeletal: Normal range of motion. He exhibits no edema  or tenderness.       Right shoulder: Normal.       Left shoulder: Normal.       Right elbow: Normal.      Left elbow: Normal.       Right hip: Normal.       Left hip: Normal.       Right knee: Normal.       Left knee: Normal.       Cervical back: Normal.       Thoracic back: Normal.       Lumbar back: Normal.       Right hand: Normal.       Left hand: Normal.  On/off exam table/in/out of chair without difficulty; gait sure and steady in hall; sat crosslegged/indian style on exam table  Lymphadenopathy:       Head (right side): No submental, no submandibular, no tonsillar, no preauricular, no posterior auricular and no occipital adenopathy present.       Head (left side): No submental, no submandibular, no tonsillar, no preauricular, no posterior auricular and no occipital adenopathy present.    He has no cervical adenopathy.       Right cervical: No superficial cervical, no deep cervical and no posterior cervical adenopathy present.      Left cervical: No  superficial cervical, no deep cervical and no posterior cervical adenopathy present.  Neurological: He is alert and oriented to person, place, and time. He has normal strength. He displays no atrophy and no tremor. No cranial nerve deficit or sensory deficit. He exhibits normal muscle tone. He displays no seizure activity. Coordination and gait normal. GCS eye subscore is 4. GCS verbal subscore is 5. GCS motor subscore is 6.  Bilateral upper and lower 5/5 extremity strength equal; gait slow and steady in hallway  Skin: Skin is warm, dry and intact. Rash noted. No abrasion, no bruising, no burn, no ecchymosis, no laceration, no lesion, no petechiae and no purpura noted. Rash is macular. Rash is not papular, not maculopapular, not nodular, not pustular, not vesicular and not urticarial. He is not diaphoretic. No cyanosis or erythema. No pallor. Nails show no clubbing.     Hyperpigmentation noted bilateral lower legs patient wearing shorts    Psychiatric: He has a normal mood and affect. His speech is normal and behavior is normal. Judgment and thought content normal. Cognition and memory are normal.  Poor eye contact; PHQ-9 completed   Nursing note and vitals reviewed      Assessment & Plan:  A-acute rhinitis, major depression recurrent, hypokalemia, diabetes type II on insulin with complications uncontrolled  Patient may use normal saline nasal spray 2 sprays each nostril q2h wa as needed. flonase 50mcg 1 spray each nostril BID #1 RF0.  Patient denied personal or family history of ENT cancer.  OTC antihistamine of choice claritin/zyrtec 10mg  po daily.  Avoid triggers if possible.  Shower prior to bedtime if exposed to triggers.  If allergic dust/dust mites recommend mattress/pillow covers/encasements; washing linens, vacuuming, sweeping, dusting weekly.  Call or return to clinic as needed if these symptoms worsen or fail to improve as anticipated.   Exitcare handout on allergic rhinitis and sinus rinse given to patient.  Patient verbalized understanding of instructions, agreed with plan of care and had no further questions at this time.  P2:  Avoidance and hand washing.  Schedule follow up with your mental health provider.  Discussed teledoc option and patient refused.  Exitcare handout on depression given to patient. Denied S/I or H/I, plan, or ideation.  Discussed other resources patient may use if symptoms worsen EMERGENCY ROOM, chaplain, PCM on call or Urgent Care Center.  Return to the clinic if any new or worsening symptoms. PHQ-9 score 23-severe depression relatively unchanged from 09 Feb 2017. Continue AA meetings.  Avoid alcohol intake.  Reviewed http://young.com/AA.org website link to [place] available meetings and did not find any meetings posted for [address]  Patient verbalized understanding of information/instructions, agreed with plan of care and had no further questions at this time. P2:  Diet and Exercise.  Stress reduction.    Schedule follow up with PCM for 1 week post discharge labs and follow up hyperglycemia, hypokalemia.  Discharged 18 Nov admitted 15 Nov.  No showed 14 Nov PCM appt.  Follow up with me Tuesday-1 week.  [facility] closed Wednesday and Thursday this week due to Thanksgiving holiday.  Discussed care options with patient if he has emergency e.g. HI/SI/syncope.    Discussed shorts and cold weather could lead to skin injury since he has decreased sensation already due to uncontrolled diabetes. Continued uncontrolled blood sugars patient not motivated to administer his insulin and choose healthy dietary choices especially with holiday this week meals tend to be high in sugar.   At high risk organ  damage and pancreatitis if his dietary and medication choices to not administer insulin continue.  Avoid dehydration. Healthy food choices with less added sugar/concentrated sugars.Discussed with patient diarrheaand high blood sugars probable cause of low potassium and magnesium serum levels. Diarrhea related to less healthy food choices and very high serum blood sugars more than likely. If no resolution with decreased blood sugars recommend follow up with GI/PCM. Go to ER if LOC/chest pain/SOB/vomiting/pooping/peeing blood.Follow up with RN Rolly Salter after lunch for sliding scale administration/blood sugar recheck. Follow up with clinic staff for assistance with insulin administration and blood sugar checks prn. Typically Follow up with RN Lavonne Chick, Tu, Th, Fr for assistance with insulin and blood sugar checks if his meter not reading a level.Patient verbalized understanding information/instructions, agreed with plan of care and had no further questions at this time.  Diarrhea probably related to unconrolled diabetes and poor dietary choices.  May use loperamide 1-2g po prn each loose bowel movement if no fever/bloody diarrhea/abdomen pain/vomiting per manufacturer's instructions max 16mg /24 hours.  I have  recommended clear fluids and advanced to soft as tolerated  Avoid dairy, spicy and fried foods until diarrhea resolves.  Patient to take temperature and if less than 100.5 F may take over the counter Imodium.  Medications as directed.   Return to the clinic if  symptoms persist or worsen; I have alerted the patient to call if high fever, dehydration, marked weakness, fainting, increased abdominal pain, blood in stool or vomit.   Patient verbalized agreement and understanding of treatment plan and had no further questions at this time.   P2:  Hand washing  Discussed with patient diarrhea, hyperglycemia, kidney disease, medications, low dietary intake can cause hypokalemia.  Diarrhea continues.  Potassium chloride po daily. Patient should have run out but he states he has plenty at home last dispensed from [facility] 01/26/2017 s/p hospital discharge.  Follow up with [facility] for repeat lab nonfasting testing  Return to clinic or ER for re-evaluation if palpitations, muscle cramps, weakness, vomiting, fatigue, syncope.  Avoid alcohol intake.  Up to date handout on potassium and magnesium rich foods given to patient by RN on 08 Feb 2017.  Patient verbalized understanding of information/instructions, agreed with plan of care and had no further questions at this time.

## 2017-02-16 NOTE — Progress Notes (Signed)
Pt presents for f/u after hospital d/c on Sunday 11/18. Cbg was 470 yesterday morning. Got it down to 148 by noon with insulin doses that he had not taken that am. Currently cbg 447. Has not taken insulin or other meds this am. "Working through" eating breakfast.   Has not yet made appt for 1 week f/u with pcp and lab recheck. Instructed pt again to do this asap. Per previous discussions with Inetta Fermoina, NP on this subject, will plan on drawing BMet and Mag in clinic on Monday 11/26 if pt does not f/u as instructed due to his frequent electrolyte abnormalities in the setting of hyperglycemia and medication noncompliance.   Pt reports diarrhea has improved slightly. "I didn't have any problems last night," whereas he often has fecal incontinence at night. Had Immodium Rx sent to pharmacy after last hospital admission but never picked up despite multiple instructions to do so. Apparently is taking OTC immodium intermittently.  Has not picked up new Rxs sent to pharmacy after this recent admission: DULoxetine . gabapentin . naltrexone . potassium chloride SA. Sts he is planning to pick up meds this evening after his AA meeting. Reports he has been going to [employer] meetings every Tu, W, Th "since getting back from [place] however in psych notes from admission, pt reported he had not attended any meetings.  BP low this morning. Pt sts he has not had any po intake today. Encouraged to drink plenty of water to increase BP this am. Pt asymptomatic with BP.

## 2017-02-17 NOTE — Patient Instructions (Addendum)
Schedule follow up with PCM 1 week after hospital discharge for repeat labs Schedule follow up with your mental health provider Continue alcoholics anonymous meetings in [place]  Sinus Rinse What is a sinus rinse? A sinus rinse is a simple home treatment that is used to rinse your sinuses with a sterile mixture of salt and water (saline solution). Sinuses are air-filled spaces in your skull behind the bones of your face and forehead that open into your nasal cavity. You will use the following:  Saline solution.  Neti pot or spray bottle. This releases the saline solution into your nose and through your sinuses. Neti pots and spray bottles can be purchased at [employer] local pharmacy, a health food store, or online.  When would I do a sinus rinse? A sinus rinse can help to clear mucus, dirt, dust, or pollen from the nasal cavity. You may do a sinus rinse when you have a cold, a virus, nasal allergy symptoms, a sinus infection, or stuffiness in the nose or sinuses. If you are considering a sinus rinse:  Ask your child's health care provider before performing a sinus rinse on your child.  Do not do a sinus rinse if you have had ear or nasal surgery, ear infection, or blocked ears.  How do I do a sinus rinse?  Wash your hands.  Disinfect your device according to the directions provided and then dry it.  Use the solution that comes with your device or one that is sold separately in stores. Follow the mixing directions on the package.  Fill your device with the amount of saline solution as directed by the device instructions.  Stand over a sink and tilt your head sideways over the sink.  Place the spout of the device in your upper nostril (the one closer to the ceiling).  Gently pour or squeeze the saline solution into the nasal cavity. The liquid should drain to the lower nostril if you are not overly congested.  Gently blow your nose. Blowing too hard may cause ear pain.  Repeat in  the other nostril.  Clean and rinse your device with clean water and then air-dry it. Are there risks of a sinus rinse? Sinus rinse is generally very safe and effective. However, there are a few risks, which include:  A burning sensation in the sinuses. This may happen if you do not make the saline solution as directed. Make sure to follow all directions when making the saline solution.  Infection from contaminated water. This is rare, but possible.  Nasal irritation.  This information is not intended to replace advice given to you by your health care provider. Make sure you discuss any questions you have with your health care provider. Document Released: 10/11/2013 Document Revised: 02/11/2016 Document Reviewed: 08/01/2013 Elsevier Interactive Patient Education  2017 Elsevier Inc. Major Depressive Disorder, Adult Major depressive disorder (MDD) is a mental health condition. MDD often makes you feel sad, hopeless, or helpless. MDD can also cause symptoms in your body. MDD can affect your:  Work.  School.  Relationships.  Other normal activities.  MDD can range from mild to very bad. It may occur once (single episode MDD). It can also occur many times (recurrent MDD). The main symptoms of MDD often include:  Feeling sad, depressed, or irritable most of the time.  Loss of interest.  MDD symptoms also include:  Sleeping too much or too little.  Eating too much or too little.  A change in your weight.  Feeling  tired (fatigue) or having low energy.  Feeling worthless.  Feeling guilty.  Trouble making decisions.  Trouble thinking clearly.  Thoughts of suicide or harming others.  Feeling weak.  Feeling agitated.  Keeping yourself from being around other people (isolation).  Follow these instructions at home: Activity  Do these things as told by your doctor: ? Go back to your normal activities. ? Exercise regularly. ? Spend time outdoors. Alcohol  Talk  with your doctor about how alcohol can affect your antidepressant medicines.  Do not drink alcohol. Or, limit how much alcohol you drink. ? This means no more than 1 drink a day for nonpregnant women and 2 drinks a day for men. One drink equals one of these:  12 oz of beer.  5 oz of wine.  1 oz of hard liquor. General instructions  Take over-the-counter and prescription medicines only as told by your doctor.  Eat a healthy diet.  Get plenty of sleep.  Find activities that you enjoy. Make time to do them.  Think about joining a support group. Your doctor may be able to suggest a group for you.  Keep all follow-up visits as told by your doctor. This is important. Where to find more information:  The [employer] on Mental Illness: ? www.nami.org  U.S. [employer] Institute of Mental Health: ? http://www.maynard.net/www.nimh.nih.gov  National Suicide Prevention Lifeline: ? (406) 072-04111-(484) 359-1754. This is free, 24-hour help. Contact a doctor if:  Your symptoms get worse.  You have new symptoms. Get help right away if:  You self-harm.  You see, hear, taste, smell, or feel things that are not present (hallucinate). If you ever feel like you may hurt yourself or others, or have thoughts about taking your own life, get help right away. You can go to your nearest emergency department or call:  Your local emergency services (911 in the [place]  A suicide crisis helpline, such as the National Suicide Prevention Lifeline: ? 559-270-83851-(484) 359-1754. This is open 24 hours a day.  This information is not intended to replace advice given to you by your health care provider. Make sure you discuss any questions you have with your health care provider. Document Released: 02/25/2015 Document Revised: 12/01/2015 Document Reviewed: 12/01/2015 Elsevier Interactive Patient Education  2017 Elsevier Inc.  Nonallergic Rhinitis  1. Nonallergic rhinitis is a term used by allergist to describe inflammation in the nose that is not  due to an allergic source (i.e., pollens, mold, animal dander or dust mites). 2. Nonallergic rhinitis can mimic many of the symptoms caused by allergies.  This includes a runny nose ("rhinorrhea"), sneezing, congestion, ear fullness and post nasal drip. 3. These symptoms usually occur year round but can be made worse by many environmental factors including weather change, irritants such as cigarette smoke, fumes, perfumes, detergents and many others. These are not allergens, but are irritants, and do not cause the formation of antibodies like true allergens.  For this reason most people with nonallergic rhinitis have negative skin tests. 4. Unfortunately, we have a poor understanding of what causes nonallergic rhinitis but certain factors such as deviated septum, sinusitis and nasal polyps may contribute to the symptoms.  Due to the poor understanding of what causes nonallergic rhinitis it cannot be cured and our treatment is only symptomatic to control symptoms. 5. Important factors in the successful treatment of nonallergic rhinitis include avoidance of the irritants that cause symptoms; for example, people who continue to smoke may never improve.  Continuous therapy works better than intermittent.  This  may mean taking medications once daily or 3-4 times a day. 6. Helpful treatments include nasal saline lavage, nasal ipratropium (Atrovent), nasal steroids, and decongestants.  Pure antihistamines don't seem to work very well.  Immunotherapy (allergy shots) has no role in the treatment of nonallergic rhinitis.  Several medications may have to be tried before a good combination is found for you.  These medications, in general, are safe for long term use.  Tolerance may develop to the therapeutic effects of these medications: Frequently changing between several helpful medications can prevent this should it occur.

## 2017-02-23 ENCOUNTER — Ambulatory Visit: Payer: Self-pay | Admitting: Registered Nurse

## 2017-02-23 VITALS — BP 116/87 | HR 78 | Temp 97.9°F

## 2017-02-23 DIAGNOSIS — R739 Hyperglycemia, unspecified: Secondary | ICD-10-CM

## 2017-02-23 DIAGNOSIS — E118 Type 2 diabetes mellitus with unspecified complications: Secondary | ICD-10-CM

## 2017-02-23 DIAGNOSIS — R197 Diarrhea, unspecified: Secondary | ICD-10-CM

## 2017-02-23 DIAGNOSIS — Z794 Long term (current) use of insulin: Principal | ICD-10-CM

## 2017-02-23 DIAGNOSIS — F331 Major depressive disorder, recurrent, moderate: Secondary | ICD-10-CM

## 2017-02-23 DIAGNOSIS — Z8639 Personal history of other endocrine, nutritional and metabolic disease: Secondary | ICD-10-CM

## 2017-02-23 DIAGNOSIS — J00 Acute nasopharyngitis [common cold]: Secondary | ICD-10-CM

## 2017-02-23 LAB — GLUCOSE, POCT (MANUAL RESULT ENTRY): POC Glucose: >600 mg/dl (ref 70–99)

## 2017-02-23 NOTE — Progress Notes (Signed)
Subjective:    Patient ID: Terry Schultz, male    DOB: 1970/12/04, 46 y.o.   MRN: [id number]  46y/o divorced caucasian male established patient reports not taking insulin since Sunday to RN Nance PewHaley Workman but stated to me he took insulin yesterday but none today. Hyperglycemic today.  Stated last blood sugar greater than his meter would read this morning.  Told RN Reinaldo BerberHaley Libre meter stated patch error so he removed patch this morning "because it was telling me it was going bad." Reports last reading he took on patch was Friday 23 Nov 18--350 to [name] and did not check it after that, but told RN he administered insulin over the weekend. Pt has not made appt for hospital d/c f/u with pcp. Apparently tried to call yesterday and was on hold too long, "then I crapped myself at work and went home early around 1100." Reported he assumed his sugar was high at that time but he had not checked it.   Patient due for nonfasting BMT/magnesium 1 week post hospitalization discharge labs today.  RN Nance PewHaley Workman drew BMT and magnesium/alcohol in clinic since pt has not scheduled f/u with PCM and skin prep completed with iodine prep from [facility].  Patient reported Thanksgiving holiday was good to see his children but some stressors as daughter hit deer with her car and aunt sick.  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  See GAD-7, PHQ-9 and audit results in screening for today.  Patient stated "I care about myself too much to hurt myself or anyone else.  I have not developed any plans to do so"  "I need to see a psychiatrist again"  "I will bring in my rehab hospital discharge paperwork for you to read when I come back for RN Rolly SalterHaley to give me my insulin shot"  Patient denied eating breakfast this morning.  Reported last BM this morning took loperamide 2mg  po yesterday and 2mg  this am after stool.  "Butt sore where I fell at work 02/11/2017 still".  Denied rash/bleeding/bruising.  Using my flonase nasal spray when  congested but not everyday.  Picked up his new cymbalta Rx and has been taking increased dose 60mg  am and 30mg  pm x 2 days.  Denied HI/SI or mood changes.      Review of Systems  Constitutional: Negative for activity change, appetite change, chills, diaphoresis, fatigue, fever and unexpected weight change.  HENT: Positive for congestion, postnasal drip and rhinorrhea. Negative for dental problem, drooling, ear discharge, ear pain, facial swelling, hearing loss, mouth sores, nosebleeds, sinus pressure, sinus pain, sneezing, sore throat, tinnitus, trouble swallowing and voice change.   Eyes: Negative for photophobia, pain, discharge, redness, itching and visual disturbance.  Respiratory: Negative for cough, choking, chest tightness, shortness of breath, wheezing and stridor.   Cardiovascular: Negative for chest pain, palpitations and leg swelling.  Gastrointestinal: Positive for diarrhea. Negative for abdominal distention, abdominal pain, blood in stool, constipation, nausea and vomiting.  Endocrine: Negative for cold intolerance and heat intolerance.  Genitourinary: Negative for difficulty urinating, dysuria and hematuria.  Musculoskeletal: Positive for myalgias. Negative for arthralgias, back pain, gait problem, joint swelling, neck pain and neck stiffness.  Skin: Negative for color change, pallor, rash and wound.  Allergic/Immunologic: Positive for environmental allergies. Negative for food allergies and immunocompromised state.  Neurological: Positive for numbness. Negative for dizziness, tremors, seizures, syncope, facial asymmetry, speech difficulty, weakness, light-headedness and headaches.  Hematological: Negative for adenopathy. Does not bruise/bleed easily.  Psychiatric/Behavioral: Positive for  dysphoric mood. Negative for agitation, behavioral problems, confusion, decreased concentration, hallucinations, self-injury and sleep disturbance. The patient is nervous/anxious. The patient is  not hyperactive.        Objective:   Physical Exam  Constitutional: He is oriented to person, place, and time. Vital signs are normal. He appears well-developed and well-nourished. He is active and cooperative.  Non-toxic appearance. He does not have a sickly appearance. He does not appear ill. No distress.  HENT:  Head: Normocephalic and atraumatic.  Right Ear: Hearing, external ear and ear canal normal. A middle ear effusion is present.  Left Ear: Hearing, external ear and ear canal normal. A middle ear effusion is present.  Nose: Nose normal.  Mouth/Throat: Uvula is midline and mucous membranes are normal. Mucous membranes are not pale, not dry and not cyanotic. He does not have dentures. No oral lesions. No trismus in the jaw. Normal dentition. No dental abscesses, uvula swelling, lacerations or dental caries. Posterior oropharyngeal edema and posterior oropharyngeal erythema present. No oropharyngeal exudate or tonsillar abscesses.  Cobblestoning posterior pharynx; bilateral allergic shiners; bilateral TMs with air fluid level 5-7 oclock opacity noted; bilateral nares edema/erythema/clear discharge  Eyes: Conjunctivae, EOM and lids are normal. Pupils are equal, round, and reactive to light. Right eye exhibits no discharge. Left eye exhibits no discharge. No scleral icterus.  Neck: Trachea normal, normal range of motion and phonation normal. Neck supple. No muscular tenderness present. No neck rigidity. No tracheal deviation, no edema, no erythema and normal range of motion present.  Cardiovascular: Normal rate, regular rhythm, normal heart sounds and intact distal pulses. Exam reveals no gallop and no friction rub.  No murmur heard. Pulses:      Radial pulses are 2+ on the right side, and 2+ on the left side.  Pulmonary/Chest: Effort normal and breath sounds normal. No accessory muscle usage or stridor. No respiratory distress. He has no decreased breath sounds. He has no wheezes. He has no  rhonchi. He has no rales. He exhibits no tenderness.  Abdominal: Soft. Normal appearance and bowel sounds are normal. He exhibits no shifting dullness, no distension, no pulsatile liver, no fluid wave, no abdominal bruit, no ascites, no pulsatile midline mass and no mass. There is no hepatosplenomegaly. There is no tenderness. There is no rigidity, no rebound, no guarding, no CVA tenderness, no tenderness at McBurney's point and negative Murphy's sign. No hernia. Hernia confirmed negative in the ventral area.  Dull to percussion x 3 quads; tympanny LUQ; normoactive bowel sounds x 4 quads; breath with fruity/alcohol scent  Musculoskeletal: He exhibits tenderness. He exhibits no edema or deformity.       Right shoulder: Normal.       Left shoulder: Normal.       Right elbow: Normal.      Left elbow: Normal.       Right hip: Normal.       Left hip: Normal.       Right knee: Normal.       Left knee: Normal.       Right ankle: Normal.       Left ankle: Normal.       Cervical back: He exhibits normal range of motion, no tenderness, no bony tenderness, no swelling, no edema, no deformity, no laceration, no pain, no spasm and normal pulse.       Thoracic back: He exhibits normal range of motion, no tenderness, no bony tenderness, no swelling, no edema, no deformity, no laceration, no  pain, no spasm and normal pulse.       Lumbar back: He exhibits tenderness and pain. He exhibits normal range of motion, no bony tenderness, no swelling, no edema, no deformity, no laceration, no spasm and normal pulse.       Back:       Right hand: Normal.       Left hand: Normal.  Gluteal cleft distal TTP patient did not want to disrobe for exam; sat cross legged on exam table without wincing; in/out of chair without difficulty and on/off exam table with assist for balance   Lymphadenopathy:       Head (right side): No submental, no submandibular, no tonsillar, no preauricular, no posterior auricular and no occipital  adenopathy present.       Head (left side): No submental, no submandibular, no tonsillar, no preauricular, no posterior auricular and no occipital adenopathy present.    He has no cervical adenopathy.       Right cervical: No superficial cervical, no deep cervical and no posterior cervical adenopathy present.      Left cervical: No superficial cervical, no deep cervical and no posterior cervical adenopathy present.  Neurological: He is alert and oriented to person, place, and time. He has normal strength. He is not disoriented. He displays no atrophy and no tremor. No cranial nerve deficit or sensory deficit. He exhibits normal muscle tone. He displays no seizure activity. Coordination and gait normal. GCS eye subscore is 4. GCS verbal subscore is 5. GCS motor subscore is 6.  Bilateral hand grasp equal 5/5; gait sure and steady slow in hallway  Skin: Skin is warm, dry and intact. No abrasion, no bruising, no burn, no ecchymosis, no laceration, no lesion and no petechiae noted. He is not diaphoretic. No cyanosis or erythema. No pallor. Nails show no clubbing.  Psychiatric: His speech is normal and behavior is normal. Judgment and thought content normal. His affect is blunt. Cognition and memory are normal.  Eye contact steady today, well groomed  Nursing note and vitals reviewed.         Assessment & Plan:  A-acute rhinitis, major depression recurrent, hypokalemia, diabetes type II on insulin with complications uncontrolled  Continue use normal saline nasal spray 2 sprays each nostril q2h wa as needed for congestion. Use daily--flonase 1 spray each nostril BID at home.  Patient denied personal or family history of ENT cancer.  OTC antihistamine of choice claritin/zyrtec 10mg  po daily.  Avoid triggers if possible.  Shower prior to bedtime if exposed to triggers.  If allergic dust/dust mites recommend mattress/pillow covers/encasements; washing linens, vacuuming, sweeping, dusting weekly.   Call or return to clinic as needed if these symptoms worsen or fail to improve as anticipated.   Exitcare handout on rhinitis and sinus rinse given to patient.  Patient verbalized understanding of instructions, agreed with plan of care and had no further questions at this time.  P2:  Avoidance and hand washing.  Schedule follow up with your mental health provider--RN Rolly Salter will assist making appt.  Discussed teledoc option and patient refused.  Exitcare handout on depression given to patient. Denied S/I or H/I, plan, or ideation. Discussed other resources patient may use if symptoms worsen EMERGENCY ROOM, chaplain, PCM on call or Urgent Care Center. Return to the clinic if any new or worsening symptoms. PHQ-9 score 26-severe depression relatively unchanged from 09 Feb 2017. Continue AA meetings.  Avoid alcohol intake.Continue new increased dose of cymbalta 60mg  am and 30mg   pm po.  Patient verbalized understanding of information/instructions, agreed with plan of care and had no further questions at this time. P2: Diet and Exercise. Stress reduction.  Schedule follow up with [facility] for hospitalization post discharge.  Discharged 18 Nov.  Labs drawn today in [facility] clinic results pending will call patient with results.  Continue medications as currently prescribed.  Follow up today, Th and Fr for blood sugar rechecks and assistance with insulin injections with RN Nance Pew.  Discussed care options with patient if he has emergency e.g. HI/SI/syncope.   Discussed shorts and cold weather could lead to skin injury since he has decreased sensation already due to uncontrolled diabetes.Continued uncontrolled blood sugarspatient not motivated to administer his insulin and choose healthy dietary choices especially with holiday this week meals tend to be high in sugar. At high riskorgan damage andpancreatitisif his dietary and medication choices to not administer insulin continue.Avoid dehydration. Healthy  food choices with less added sugar/concentrated sugars.Discussed with patient diarrheaand high blood sugars probable cause of low potassium and magnesium serum levels. Diarrhea related to less healthy food choices and very high serum blood sugars more than likely. If no resolution with decreased blood sugars recommend follow up with GI/PCM. Go to ER if LOC/chest pain/SOB/vomiting/pooping/peeing blood.Follow up with RN Rolly Salter after lunch for sliding scale administration/blood sugar recheck. Follow up with clinic staff for assistance with insulin administration and blood sugar checks prn. Typically Follow up with RN Lavonne Chick, Tu, Th, Fr for assistance with insulin and blood sugar checks if his meter not reading a level.Patient verbalized understanding information/instructions, agreed with plan of care and had no further questions at this time.  Diarrhea probably related to unconrolled diabetes and poor dietary choices. May use loperamide 1-2mg  poprneach loose bowel movementif no fever/bloodydiarrhea/abdomen pain/vomiting per manufacturer's instructions max 16mg /24 hours.I have recommended clear fluids and advanced to soft as tolerated Avoid dairy, spicy and fried foods until diarrhea resolves. Patient to take temperature and if less than 100.5 F may take over the counter Imodium. Medications as directed. Return to the clinic if symptoms persist or worsen; I have alerted the patient to call if high fever, dehydration, marked weakness, fainting, increased abdominal pain, blood in stool or vomit. Patient verbalized agreement and understanding of treatment planand had no further questions at this time. P2: Hand washing  Discussed with patient diarrhea,hyperglycemia, kidney disease, medications,low dietary intake can cause hypokalemia. Diarrheacontinues.Potassium chloride podaily. Electrolytes drawn today results pending.Return to clinic or ER for re-evaluation  ifpalpitations,muscle cramps, weakness, vomiting, fatigue, syncope.Avoid alcohol intake.Up to date handout on potassiumand magnesiumrich foods given to patientby RN on 08 Feb 2017. Patient verbalized understanding of information/instructions, agreed with plan of care and had no further questions at this time.

## 2017-02-23 NOTE — Patient Instructions (Addendum)
Follow up with RN Nance Pew for repeat blood sugar check and assistance with insulin injections at work M, Tu, Th, Fr  Follow up with RN Rolly Salter to schedule PCM and Mental Health follow up appts--scheduling assistance  Bring discharge paperwork from rehab hospital for review  Reapply freestyle libre monitoring patch, healthy dietary choices-lean proteins, vegetables, avoid alcohol intake  ER if acute mental health crisis, consider teledoc mental health visit as bridge until face to face appt can be scheduled   Living With Anxiety After being diagnosed with an anxiety disorder, you may be relieved to know why you have felt or behaved a certain way. It is natural to also feel overwhelmed about the treatment ahead and what it will mean for your life. With care and support, you can manage this condition and recover from it. How to cope with anxiety Dealing with stress Stress is your body's reaction to life changes and events, both good and bad. Stress can last just a few hours or it can be ongoing. Stress can play a major role in anxiety, so it is important to learn both how to cope with stress and how to think about it differently. Talk with your health care provider or a counselor to learn more about stress reduction. He or she may suggest some stress reduction techniques, such as:  Music therapy. This can include creating or listening to music that you enjoy and that inspires you.  Mindfulness-based meditation. This involves being aware of your normal breaths, rather than trying to control your breathing. It can be done while sitting or walking.  Centering prayer. This is a kind of meditation that involves focusing on a word, phrase, or sacred image that is meaningful to you and that brings you peace.  Deep breathing. To do this, expand your stomach and inhale slowly through your nose. Hold your breath for 3-5 seconds. Then exhale slowly, allowing your stomach muscles to relax.  Self-talk.  This is a skill where you identify thought patterns that lead to anxiety reactions and correct those thoughts.  Muscle relaxation. This involves tensing muscles then relaxing them.  Choose a stress reduction technique that fits your lifestyle and personality. Stress reduction techniques take time and practice. Set aside 5-15 minutes a day to do them. Therapists can offer training in these techniques. The training may be covered by some insurance plans. Other things you can do to manage stress include:  Keeping a stress diary. This can help you learn what triggers your stress and ways to control your response.  Thinking about how you respond to certain situations. You may not be able to control everything, but you can control your reaction.  Making time for activities that help you relax, and not feeling guilty about spending your time in this way.  Therapy combined with coping and stress-reduction skills provides the best chance for successful treatment. Medicines Medicines can help ease symptoms. Medicines for anxiety include:  Anti-anxiety drugs.  Antidepressants.  Beta-blockers.  Medicines may be used as the main treatment for anxiety disorder, along with therapy, or if other treatments are not working. Medicines should be prescribed by a health care provider. Relationships Relationships can play a big part in helping you recover. Try to spend more time connecting with trusted friends and family members. Consider going to couples counseling, taking family education classes, or going to family therapy. Therapy can help you and others better understand the condition. How to recognize changes in your condition Everyone has a different  response to treatment for anxiety. Recovery from anxiety happens when symptoms decrease and stop interfering with your daily activities at home or work. This may mean that you will start to:  Have better concentration and focus.  Sleep better.  Be less  irritable.  Have more energy.  Have improved memory.  It is important to recognize when your condition is getting worse. Contact your health care provider if your symptoms interfere with home or work and you do not feel like your condition is improving. Where to find help and support: You can get help and support from these sources:  Self-help groups.  Online and [employer].  A trusted spiritual leader.  Couples counseling.  Family education classes.  Family therapy.  Follow these instructions at home:  Eat a healthy diet that includes plenty of vegetables, fruits, whole grains, low-fat dairy products, and lean protein. Do not eat a lot of foods that are high in solid fats, added sugars, or salt.  Exercise. Most adults should do the following: ? Exercise for at least 150 minutes each week. The exercise should increase your heart rate and make you sweat (moderate-intensity exercise). ? Strengthening exercises at least twice a week.  Cut down on caffeine, tobacco, alcohol, and other potentially harmful substances.  Get the right amount and quality of sleep. Most adults need 7-9 hours of sleep each night.  Make choices that simplify your life.  Take over-the-counter and prescription medicines only as told by your health care provider.  Avoid caffeine, alcohol, and certain over-the-counter cold medicines. These may make you feel worse. Ask your pharmacist which medicines to avoid.  Keep all follow-up visits as told by your health care provider. This is important. Questions to ask your health care provider  Would I benefit from therapy?  How often should I follow up with a health care provider?  How long do I need to take medicine?  Are there any long-term side effects of my medicine?  Are there any alternatives to taking medicine? Contact a health care provider if:  You have a hard time staying focused or finishing daily tasks.  You spend many hours a  day feeling worried about everyday life.  You become exhausted by worry.  You start to have headaches, feel tense, or have nausea.  You urinate more than normal.  You have diarrhea. Get help right away if:  You have a racing heart and shortness of breath.  You have thoughts of hurting yourself or others. If you ever feel like you may hurt yourself or others, or have thoughts about taking your own life, get help right away. You can go to your nearest emergency department or call:  Your local emergency services (911 in the [place]  A suicide crisis helpline, such as the National Suicide Prevention Lifeline at 843-440-1118. This is open 24-hours a day.  Summary  Taking steps to deal with stress can help calm you.  Medicines cannot cure anxiety disorders, but they can help ease symptoms.  Family, friends, and partners can play a big part in helping you recover from an anxiety disorder. This information is not intended to replace advice given to you by your health care provider. Make sure you discuss any questions you have with your health care provider. Document Released: 03/10/2016 Document Revised: 03/10/2016 Document Reviewed: 03/10/2016 Major Depressive Disorder, Adult Major depressive disorder (MDD) is a mental health condition. It may also be called clinical depression or unipolar depression. MDD usually causes feelings of sadness,  hopelessness, or helplessness. MDD can also cause physical symptoms. It can interfere with work, school, relationships, and other everyday activities. MDD may be mild, moderate, or severe. It may occur once (single episode major depressive disorder) or it may occur multiple times (recurrent major depressive disorder). What are the causes? The exact cause of this condition is not known. MDD is most likely caused by a combination of things, which may include:  Genetic factors. These are traits that are passed along from parent to child.  Individual  factors. Your personality, your behavior, and the way you handle your thoughts and feelings may contribute to MDD. This includes personality traits and behaviors learned from others.  Physical factors, such as: ? Differences in the part of your brain that controls emotion. This part of your brain may be different than it is in people who do not have MDD. ? Long-term (chronic) medical or psychiatric illnesses.  Social factors. Traumatic experiences or major life changes may play a role in the development of MDD.  What increases the risk? This condition is more likely to develop in women. The following factors may also make you more likely to develop MDD:  A family history of depression.  Troubled family relationships.  Abnormally low levels of certain brain chemicals.  Traumatic events in childhood, especially abuse or the loss of a parent.  Being under a lot of stress, or long-term stress, especially from upsetting life experiences or losses.  A history of: ? Chronic physical illness. ? Other mental health disorders. ? Substance abuse.  Poor living conditions.  Experiencing social exclusion or discrimination on a regular basis.  What are the signs or symptoms? The main symptoms of MDD typically include:  Constant depressed or irritable mood.  Loss of interest in things and activities.  MDD symptoms may also include:  Sleeping or eating too much or too little.  Unexplained weight change.  Fatigue or low energy.  Feelings of worthlessness or guilt.  Difficulty thinking clearly or making decisions.  Thoughts of suicide or of harming others.  Physical agitation or weakness.  Isolation.  Severe cases of MDD may also occur with other symptoms, such as:  Delusions or hallucinations, in which you imagine things that are not real (psychotic depression).  Low-level depression that lasts at least a year (chronic depression or persistent depressive disorder).  Extreme  sadness and hopelessness (melancholic depression).  Trouble speaking and moving (catatonic depression).  How is this diagnosed? This condition may be diagnosed based on:  Your symptoms.  Your medical history, including your mental health history. This may involve tests to evaluate your mental health. You may be asked questions about your lifestyle, including any drug and alcohol use, and how long you have had symptoms of MDD.  A physical exam.  Blood tests to rule out other conditions.  You must have a depressed mood and at least four other MDD symptoms most of the day, nearly every day in the same 2-week timeframe before your health care provider can confirm a diagnosis of MDD. How is this treated? This condition is usually treated by mental health professionals, such as psychologists, psychiatrists, and clinical social workers. You may need more than one type of treatment. Treatment may include:  Psychotherapy. This is also called talk therapy or counseling. Types of psychotherapy include: ? Cognitive behavioral therapy (CBT). This type of therapy teaches you to recognize unhealthy feelings, thoughts, and behaviors, and replace them with positive thoughts and actions. ? Interpersonal therapy (IPT). This  helps you to improve the way you relate to and communicate with others. ? Family therapy. This treatment includes members of your family.  Medicine to treat anxiety and depression, or to help you control certain emotions and behaviors.  Lifestyle changes, such as: ? Limiting alcohol and drug use. ? Exercising regularly. ? Getting plenty of sleep. ? Making healthy eating choices. ? Spending more time outdoors.  Treatments involving stimulation of the brain can be used in situations with extremely severe symptoms, or when medicine or other therapies do not work over time. These treatments include electroconvulsive therapy, transcranial magnetic stimulation, and vagal nerve  stimulation. Follow these instructions at home: Activity  Return to your normal activities as told by your health care provider.  Exercise regularly and spend time outdoors as told by your health care provider. General instructions  Take over-the-counter and prescription medicines only as told by your health care provider.  Do not drink alcohol. If you drink alcohol, limit your alcohol intake to no more than 1 drink a day for nonpregnant women and 2 drinks a day for men. One drink equals 12 oz of beer, 5 oz of wine, or 1 oz of hard liquor. Alcohol can affect any antidepressant medicines you are taking. Talk to your health care provider about your alcohol use.  Eat a healthy diet and get plenty of sleep.  Find activities that you enjoy doing, and make time to do them.  Consider joining a support group. Your health care provider may be able to recommend a support group.  Keep all follow-up visits as told by your health care provider. This is important. Where to find more information: [employer]ness  www.nami.org  [employer] Mental Health  http://www.maynard.net/www.nimh.nih.gov  National Suicide Prevention Lifeline  1-800-273-TALK 502-341-8250(8255). This is free, 24-hour help.  Contact a health care provider if:  Your symptoms get worse.  You develop new symptoms. Get help right away if:  You self-harm.  You have serious thoughts about hurting yourself or others.  You see, hear, taste, smell, or feel things that are not present (hallucinate). This information is not intended to replace advice given to you by your health care provider. Make sure you discuss any questions you have with your health care provider. Document Released: 07/11/2012 Document Revised: 11/21/2015 Document Reviewed: 09/25/2015 Elsevier Interactive Patient Education  2017 [employer] Inc.  Start with erect posture. Lower shoulders. Hold ____ seconds. Repeat ____ times. Do ____ sessions per  day.  Copyright  VHI. All rights reserved.   Sinus Rinse What is a sinus rinse? A sinus rinse is a simple home treatment that is used to rinse your sinuses with a sterile mixture of salt and water (saline solution). Sinuses are air-filled spaces in your skull behind the bones of your face and forehead that open into your nasal cavity. You will use the following:  Saline solution.  Neti pot or spray bottle. This releases the saline solution into your nose and through your sinuses. Neti pots and spray bottles can be purchased at [facility] local pharmacy, a health food store, or online.  When would I do a sinus rinse? A sinus rinse can help to clear mucus, dirt, dust, or pollen from the nasal cavity. You may do a sinus rinse when you have a cold, a virus, nasal allergy symptoms, a sinus infection, or stuffiness in the nose or sinuses. If you are considering a sinus rinse:  Ask your child's health care provider before performing  a sinus rinse on your child.  Do not do a sinus rinse if you have had ear or nasal surgery, ear infection, or blocked ears.  How do I do a sinus rinse?  Wash your hands.  Disinfect your device according to the directions provided and then dry it.  Use the solution that comes with your device or one that is sold separately in stores. Follow the mixing directions on the package.  Fill your device with the amount of saline solution as directed by the device instructions.  Stand over a sink and tilt your head sideways over the sink.  Place the spout of the device in your upper nostril (the one closer to the ceiling).  Gently pour or squeeze the saline solution into the nasal cavity. The liquid should drain to the lower nostril if you are not overly congested.  Gently blow your nose. Blowing too hard may cause ear pain.  Repeat in the other nostril.  Clean and rinse your device with clean water and then air-dry it. Are there risks of a sinus rinse? Sinus rinse  is generally very safe and effective. However, there are a few risks, which include:  A burning sensation in the sinuses. This may happen if you do not make the saline solution as directed. Make sure to follow all directions when making the saline solution.  Infection from contaminated water. This is rare, but possible.  Nasal irritation.  This information is not intended to replace advice given to you by your health care provider. Make sure you discuss any questions you have with your health care provider. Document Released: 10/11/2013 Document Revised: 02/11/2016 Document Reviewed: 08/01/2013 Elsevier Interactive Patient Education  2017 Elsevier Inc.  Nonallergic Rhinitis Nonallergic rhinitis is a condition that causes symptoms that affect the nose, such as a runny nose and a stuffed-up nose (nasal congestion) that can make it hard to breathe through the nose. This condition is different from having an allergy (allergic rhinitis). Allergic rhinitis occurs when the body's defense system (immune system) reacts to a substance that you are allergic to (allergen), such as pollen, pet dander, mold, or dust. Nonallergic rhinitis has many similar symptoms, but it is not caused by allergens. Nonallergic rhinitis can be a short-term or long-term problem. What are the causes? This condition can be caused by many different things. Some common types of nonallergic rhinitis include: Infectious rhinitis  This is usually due to an infection in the upper respiratory tract. Vasomotor rhinitis  This is the most common type of long-term nonallergic rhinitis.  It is caused by too much blood flow through the nose, which makes the tissue inside of the nose swell.  Symptoms are often triggered by strong odors, cold air, stress, drinking alcohol, cigarette smoke, or changes in the weather. Occupational rhinitis  This type is caused by triggers in the workplace, such as chemicals, dusts, animal dander, or air  pollution. Hormonal rhinitis  This type occurs in women as a result of an increase in the male hormone estrogen.  It may occur during pregnancy, puberty, and menstrual cycles.  Symptoms improve when estrogen levels drop. Drug-induced rhinitis Several drugs can cause nonallergic rhinitis, including:  Medicines that are used to treat high blood pressure, heart disease, and Parkinson disease.  Aspirin and NSAIDs.  Over-the-counter nasal decongestant sprays. These can cause a type of nonallergic rhinitis (rhinitis medicamentosa) when they are used for more than a few days.  Nonallergic rhinitis with eosinophilia syndrome (NARES)  This type is  caused by having too much of a certain type of white blood cell (eosinophil). Nonallergic rhinitis can also be caused by a reaction to eating hot or spicy foods. This does not usually cause long-term symptoms. In some cases, the cause of nonallergic rhinitis is not known. What increases the risk? You are more likely to develop this condition if:  You are 4-63 years of age.  You are a woman. Women are twice as likely to have this condition.  What are the signs or symptoms? Common symptoms of this condition include:  Nasal congestion.  Runny nose.  The feeling of mucus going down the back of the throat (postnasal drip).  Trouble sleeping at night and daytime sleepiness.  Less common symptoms include:  Sneezing.  Coughing.  Itchy nose.  Bloodshot eyes.  How is this diagnosed? This condition may be diagnosed based on:  Your symptoms and medical history.  A physical exam.  Allergy testing to rule out allergic rhinitis. You may have skin tests or blood tests.  In some cases, the health care provider may take a swab of nasal secretions to look for an increased number of eosinophils. This would be done to confirm a diagnosis of NARES. How is this treated? Treatment for this condition depends on the cause. No single treatment  works for everyone. Work with your health care provider to find the best treatment for you. Treatment may include:  Avoiding the things that trigger your symptoms.  Using medicines to relieve congestion, such as: ? Steroid nasal spray. There are many types. You may need to try a few to find out which one works best. ? Decongestant medicine. This may be an oral medicine or a nasal spray. These medicines are only used for a short time.  Using medicines to relieve a runny nose. These may include antihistamine medicines or anticholinergic nasal sprays.  Surgery to remove tissue from inside the nose may be needed in severe cases if the condition has not improved after 6-12 months of medical treatment. Follow these instructions at home:  Take or use over-the-counter and prescription medicines only as told by your health care provider. Do not stop using your medicine even if you start to feel better.  Use salt-water (saline) rinses or other solutions (nasal washes or irrigations) to wash or rinse out the inside of your nose as told by your health care provider.  Do not take NSAIDs or medicines that contain aspirin if they make your symptoms worse.  Do not drink alcohol if it makes your symptoms worse.  Do not use any tobacco products, such as cigarettes, chewing tobacco, and e-cigarettes. If you need help quitting, ask your health care provider.  Avoid secondhand smoke.  Get some exercise every day. Exercise may help reduce symptoms of nonallergic rhinitis for some people. Ask your health care provider how much exercise and what types of exercise are safe for you.  Sleep with the head of your bed raised (elevated). This may reduce nighttime nasal congestion.  Keep all follow-up visits as told by your health care provider. This is important. Contact a health care provider if:  You have a fever.  Your symptoms are getting worse at home.  Your symptoms are not responding to medicine.  You  develop new symptoms, especially a headache or nosebleed. This information is not intended to replace advice given to you by your health care provider. Make sure you discuss any questions you have with your health care provider. Document Released: 07/08/2015  Document Revised: 08/22/2015 Document Reviewed: 06/06/2015 Elsevier Interactive Patient Education  [employer].

## 2017-02-24 NOTE — Progress Notes (Signed)
Contacted via telephone by RN Nance PewHaley Workman 28 Nov at [phone].  Pt did not return yesterday afternoon for insulin administration assistance or to bring psych d/c paperwork [place] as he reported he would to NP [employer].   [facility] contacted RN via telephone for critical glucose this am. Reported to NP serum glucose of 793 other results pending/check computer.

## 2017-02-25 ENCOUNTER — Ambulatory Visit: Payer: Self-pay | Admitting: Registered Nurse

## 2017-02-25 VITALS — BP 130/87 | HR 87 | Temp 98.6°F

## 2017-02-25 DIAGNOSIS — E1165 Type 2 diabetes mellitus with hyperglycemia: Principal | ICD-10-CM

## 2017-02-25 DIAGNOSIS — F332 Major depressive disorder, recurrent severe without psychotic features: Secondary | ICD-10-CM

## 2017-02-25 DIAGNOSIS — R780 Finding of alcohol in blood: Secondary | ICD-10-CM

## 2017-02-25 DIAGNOSIS — IMO0002 Reserved for concepts with insufficient information to code with codable children: Secondary | ICD-10-CM

## 2017-02-25 DIAGNOSIS — E1142 Type 2 diabetes mellitus with diabetic polyneuropathy: Secondary | ICD-10-CM

## 2017-02-25 DIAGNOSIS — Z794 Long term (current) use of insulin: Principal | ICD-10-CM

## 2017-02-25 LAB — BASIC METABOLIC PANEL
BUN / CREAT RATIO: 12 (ref 9–20)
BUN: 9 mg/dL (ref 6–24)
CHLORIDE: 77 mmol/L — AB (ref 96–106)
CO2: 24 mmol/L (ref 20–29)
Calcium: 10 mg/dL (ref 8.7–10.2)
Creatinine, Ser: 0.77 mg/dL (ref 0.76–1.27)
GFR calc non Af Amer: 107 mL/min/{1.73_m2} (ref >59)
GFR, EST AFRICAN AMERICAN: 124 mL/min/{1.73_m2} (ref >59)
Glucose: 793 mg/dL (ref 65–99)
Potassium: 4.9 mmol/L (ref 3.5–5.2)
Sodium: 129 mmol/L — ABNORMAL LOW (ref 134–144)

## 2017-02-25 LAB — GLUCOSE, POCT (MANUAL RESULT ENTRY): POC GLUCOSE: 593 mg/dL — AB (ref 70–99)

## 2017-02-25 LAB — ETHANOL: Ethanol: 0.191 %

## 2017-02-25 LAB — MAGNESIUM: Magnesium: 1.7 mg/dL (ref 1.6–2.3)

## 2017-02-25 NOTE — Progress Notes (Signed)
Appt made [facility] today. Sent via email.  PCP office called by RN for hospital d/c f/u appt. Dr. Tacy DuraValezquez leaves office between 12-3pm everyday so no after 3pm slots available per pt request. Appt made with NP Cheryl FlashLucy Barden for 03/03/17 at 4:20pm. Will inform pt at appt today.

## 2017-02-25 NOTE — Progress Notes (Signed)
Subjective:    Patient ID: Terry Schultz, male    DOB: [birth year], 46 y.o.   MRN: [id number]  46y/o caucasian divorced male established Pt presents for f/u with NP as he has not yet seen pcp for hospital d/c f/u. Schultz made appt with pcp for pt at his request. Appt 03/03/17 at 4:20pm, arrive 10 min early. Appt info given to pt.  Pt reports sleeping in this morning and running out time to do much, though states he did take his Lantus, but not Humalog. Lantus today and last night. Last dose Humalog lunch yesterday. Has not applied new Libre patch. Told NP yesterday via phone that he could not reach the back of his arm to apply it. Was told to bring it to clinic today for assistance with applying but he states he forgot it at home. Has not checked cbg since Schultz did on Tuesday. Humalog 12units administered to L posterior upper arm per pt request for cbg 597. Pt reports diarrhea has continued last episode accident last night in bed per patient.  Denied black, red, explosive watery stools.  Patient reported feeling a little light headed this morning had french toast stick without syrup from [employer], half bottle no sugar gatorade.  Last took loperamide yesterday.  Patient reported has been taking nyquil for past week and a half for cough at night and he uses mouthwash daily denied drinking mouthwash.  Reconfirmed he has not drank alcoholic beverage since Jul 2018.  Has not scheduled mental health appt himself pending assistance from [employer].  Did speak with VP of company for personal leave x 2 weeks so he could attend mental health appts during holiday season. HR Rep Terry Schultz came to clinic today after pt appt to verify if patient has pending medical appts in the next 2 weeks.  Confirmed he had PCM appt scheduled and another pending scheduling.  Patient stated he plans to attend AA meetings as usual, goes as expected boring.  Denied HI/SI still depressed and anxious taking prescribed medications at  increased dosing per discharge hospitalization instructions.  Feelings and energy level unchanged from last PHQ-9/GAD-7 last week.  Sleeping okay except when he had diarrhea in middle of night.  Has not brought alcohol rehab paperwork to clinic states at his workstation in his bag.  Stated will bring to Schultz when he returns for insulin injection.      Review of Systems  Constitutional: Positive for fatigue. Negative for activity change, appetite change, chills, diaphoresis, fever and unexpected weight change.  HENT: Negative for congestion, dental problem, drooling, ear discharge, facial swelling, hearing loss, nosebleeds, postnasal drip, rhinorrhea, sinus pressure, sinus pain, sneezing, sore throat, trouble swallowing and voice change.   Eyes: Negative for photophobia and visual disturbance.  Respiratory: Positive for cough. Negative for choking, chest tightness, shortness of breath, wheezing and stridor.   Cardiovascular: Negative for chest pain, palpitations and leg swelling.  Gastrointestinal: Positive for diarrhea. Negative for abdominal distention, abdominal pain, anal bleeding, blood in stool, constipation, nausea, rectal pain and vomiting.  Endocrine: Negative for cold intolerance and heat intolerance.  Genitourinary: Negative for decreased urine volume, dysuria and enuresis.  Musculoskeletal: Negative for arthralgias, back pain, gait problem, joint swelling, myalgias, neck pain and neck stiffness.  Skin: Negative for color change, pallor, rash and wound.  Allergic/Immunologic: Positive for environmental allergies and immunocompromised state. Negative for food allergies.  Neurological: Positive for numbness. Negative for dizziness, tremors, seizures, syncope, facial asymmetry, speech difficulty, weakness, light-headedness  and headaches.  Hematological: Negative for adenopathy. Does not bruise/bleed easily.  Psychiatric/Behavioral: Positive for dysphoric mood and sleep disturbance. Negative  for agitation, confusion, decreased concentration and self-injury. The patient is nervous/anxious.        Objective:   Physical Exam  Constitutional: He is oriented to person, place, and time. Vital signs are normal. He appears well-developed and well-nourished. He is active and cooperative.  Non-toxic appearance. He does not have a sickly appearance. He does not appear ill. No distress.  HENT:  Head: Normocephalic and atraumatic.  Right Ear: Hearing, external ear and ear canal normal. A middle ear effusion is present.  Left Ear: Hearing, external ear and ear canal normal. A middle ear effusion is present.  Nose: Nose normal. Right sinus exhibits no maxillary sinus tenderness and no frontal sinus tenderness. Left sinus exhibits no maxillary sinus tenderness and no frontal sinus tenderness.  Mouth/Throat: Uvula is midline and mucous membranes are normal. Mucous membranes are not pale, not dry and not cyanotic. He does not have dentures. No oral lesions. No trismus in the jaw. Normal dentition. No dental abscesses, uvula swelling, lacerations or dental caries. Posterior oropharyngeal edema and posterior oropharyngeal erythema present. No oropharyngeal exudate or tonsillar abscesses.  Cobblestoning posterior pharynx; bilateral TMs air fluid level clear; bilateral allergic shiners  Eyes: Conjunctivae, EOM and lids are normal. Pupils are equal, round, and reactive to light. Right eye exhibits no discharge. Left eye exhibits no discharge. No scleral icterus.  Neck: Trachea normal, normal range of motion and phonation normal. Neck supple. No tracheal tenderness and no muscular tenderness present. No neck rigidity. No tracheal deviation, no edema, no erythema and normal range of motion present.  Cardiovascular: Normal rate, regular rhythm, normal heart sounds and intact distal pulses. Exam reveals no gallop and no friction rub.  No murmur heard. Pulses:      Radial pulses are 2+ on the right side, and 2+  on the left side.  Pulmonary/Chest: Effort normal and breath sounds normal. No accessory muscle usage or stridor. No respiratory distress. He has no decreased breath sounds. He has no wheezes. He has no rhonchi. He has no rales. He exhibits tenderness.  Abdominal: Soft. Normal appearance. He exhibits no shifting dullness, no distension, no pulsatile liver, no fluid wave, no abdominal bruit, no ascites, no pulsatile midline mass and no mass. Bowel sounds are decreased. There is no hepatosplenomegaly. There is no tenderness. There is no rigidity, no rebound, no guarding, no CVA tenderness, no tenderness at McBurney's point and negative Murphy's sign. Hernia confirmed negative in the ventral area.  Dull to percussion x 4 quads; hypoactive bowel sounds x 4 quads  Musculoskeletal: Normal range of motion. He exhibits no edema, tenderness or deformity.       Right shoulder: Normal.       Left shoulder: Normal.       Right elbow: Normal.      Left elbow: Normal.       Right hip: Normal.       Left hip: Normal.       Right knee: Normal.       Left knee: Normal.       Right ankle: Normal.       Left ankle: Normal.       Cervical back: Normal.       Thoracic back: Normal.       Lumbar back: Normal.       Right hand: Normal.  Left hand: Normal.  Patient sat cross legged on exam table; in/out chair without difficulty; on exam table quickly but slow to get off exam table prefers to hold wall or my hand to step down feels unstable otherwise afraid of falling  Lymphadenopathy:       Head (right side): No submental, no submandibular, no tonsillar, no preauricular, no posterior auricular and no occipital adenopathy present.       Head (left side): No submental, no submandibular, no tonsillar, no preauricular, no posterior auricular and no occipital adenopathy present.    He has no cervical adenopathy.       Right cervical: No superficial cervical, no deep cervical and no posterior cervical adenopathy  present.      Left cervical: No superficial cervical, no deep cervical and no posterior cervical adenopathy present.  Neurological: He is alert and oriented to person, place, and time. He has normal strength. He is not disoriented. He displays no atrophy, no tremor and normal reflexes. No cranial nerve deficit or sensory deficit. He exhibits normal muscle tone. He displays no seizure activity. Coordination and gait normal. GCS eye subscore is 4. GCS verbal subscore is 5. GCS motor subscore is 6.  Reflex Scores:      Patellar reflexes are 2+ on the right side and 2+ on the left side. Assist off exam table as patient feeling unbalanced with one hand; gait sure and steady in hallway; bilateral hand grasp 5/5 equal  Skin: Skin is warm and intact. Rash noted. No abrasion, no bruising, no burn, no ecchymosis, no laceration, no lesion, no petechiae and no purpura noted. Rash is macular. Rash is not papular, not maculopapular, not nodular, not pustular, not vesicular and not urticarial. He is not diaphoretic. No cyanosis or erythema. No pallor. Nails show no clubbing.     Macular hyperpigmentation bilateral anterior lower legs  Psychiatric: He has a normal mood and affect. His speech is normal and behavior is normal. Judgment and thought content normal. Cognition and memory are normal.  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  VSS, BBS CTA Cautioned on use of nyquil better to control rhinitis with flonase/shower as history of alcohol abuse and inpatient rehab this summer.  Continue use normal saline nasal spray 2 sprays each nostril q2h wa as needed for congestion. Use daily--flonase 1 spray each nostril BID at home. Patient denied  personal or family history of ENT cancer. OTC antihistamine of choice claritin/zyrtec 10mg  po daily. Avoid triggers if possible. Shower prior to bedtime if exposed to triggers. If allergic dust/dust mites recommend mattress/pillow covers/encasements; washing linens, vacuuming, sweeping, dusting weekly. Call or return to clinic as needed if these symptoms worsen or fail to improve as anticipated. Patient verbalized understanding of instructions, agreed with plan of care and had no further questions at this time.  P2: Avoidance and hand washing.  Schedule follow up with your mental health provider--Schultz Terry Schultz will assist making appt. Discussed teledoc option and patient refused again. Denied plan but still feeling down but then states this is my favorite and best time of year--least symptoms.  PCM appt scheduled 5 Dec, on medications take as prescribed.  Avoid drinking mouthwash only take 1-2 teaspoons nyquil at bedtime.  Avoid alcoholic beverages  as history alcohol abuse.  Discussed other resources patient may use if symptoms worsen EMERGENCY ROOM, chaplain, PCM on call or Urgent Care Center. Return to the clinic if any new or worsening symptoms.Continue AA meetings. Patient has requested two weeks of personal leave as less anxiety when he is not at work.  Pending approval from employer.  FMLA time has used all available for this year.  Patient verbalized understanding of information/instructions, agreed with plan of care and had no further questions at this time. P2: Diet and Exercise. Stress reduction.  Follow up this afternoon and tomorrow with Terry Schultz.  Re-reviewed lab results and todays CBG results improved from serum levels last week.  Continue medications as currently prescribed.  Follow up today, Th and Fr for blood sugar rechecks and assistance with insulin injections with Schultz Terry Schultz.Discussed care options with patient if he has emergency e.g. HI/SI/syncope. Continued  uncontrolled blood sugars but improved 200 points from last check. patient not motivated to administer his insulin and choose healthy dietary choicesespecially with holiday this week meals tend to be high in sugar.At high riskorgan damage andpancreatitisif his dietary and medication choices to not administer insulin continue.Avoid dehydration. Healthy food choices with less added sugar/concentrated sugars.Discussed with patient diarrheaprobably secondary to  high blood sugars Diarrhea related to less healthy food choices and very high serum blood sugars more than likely. If no resolution with decreased blood sugars recommend follow up with GI/PCM. Go to ER if LOC/chest pain/SOB/vomiting/pooping/peeing blood.Follow up with Schultz Terry SalterHaley after lunch for sliding scale administration/blood sugar recheck. Follow up with clinic staff for assistance with insulin administration and blood sugar checks prn.TypicallyFollow up with Schultz Terry Schultz, Tu, Th, Fr for assistance with insulin and blood sugar checks if his meter not reading a level.Patient verbalized understanding information/instructions, agreed with plan of care and had no further questions at this time.  Diarrhea probably related to unconrolled diabetes and poor dietary choices. May use loperamide 1-2mg  poprneach loose bowel movementif no fever/bloodydiarrhea/abdomen pain/vomiting per manufacturer's instructions max 16mg /24 hours.I have recommended clear fluids and advanced to soft as tolerated Avoid dairy, spicy and fried foods until diarrhea resolves. Patient to take temperature and if less than 100.5 F may take over the counter Imodium. Medications as directed. Return to the clinic if symptoms persist or worsen; I have alerted the patient to call if high fever, dehydration, marked weakness, fainting, increased abdominal pain, blood in stool or vomit. Patient verbalized agreement and understanding of treatment planand had no further  questions at this time. P2: Hand washing

## 2017-02-26 ENCOUNTER — Ambulatory Visit: Payer: Self-pay | Admitting: *Deleted

## 2017-02-26 DIAGNOSIS — IMO0002 Reserved for concepts with insufficient information to code with codable children: Secondary | ICD-10-CM

## 2017-02-26 DIAGNOSIS — E1165 Type 2 diabetes mellitus with hyperglycemia: Principal | ICD-10-CM

## 2017-02-26 DIAGNOSIS — E1142 Type 2 diabetes mellitus with diabetic polyneuropathy: Secondary | ICD-10-CM

## 2017-02-26 DIAGNOSIS — Z794 Long term (current) use of insulin: Principal | ICD-10-CM

## 2017-02-26 LAB — GLUCOSE, POCT (MANUAL RESULT ENTRY): POC Glucose: 560 mg/dl — AB (ref 70–99)

## 2017-02-26 NOTE — Patient Instructions (Signed)
Alcohol Use Disorder Alcohol use disorder is when your drinking disrupts your daily life. When you have this condition, you drink too much alcohol and you cannot control your drinking. Alcohol use disorder can cause serious problems with your physical health. It can affect your brain, heart, liver, pancreas, immune system, stomach, and intestines. Alcohol use disorder can increase your risk for certain cancers and cause problems with your mental health, such as depression, anxiety, psychosis, delirium, and dementia. People with this disorder risk hurting themselves and others. What are the causes? This condition is caused by drinking too much alcohol over time. It is not caused by drinking too much alcohol only one or two times. Some people with this condition drink alcohol to cope with or escape from negative life events. Others drink to relieve pain or symptoms of mental illness. What increases the risk? You are more likely to develop this condition if:  You have a family history of alcohol use disorder.  Your culture encourages drinking to the point of intoxication, or makes alcohol easy to get.  You had a mood or conduct disorder in childhood.  You have been a victim of abuse.  You are an adolescent and: ? You have poor grades or difficulties in school. ? Your caregivers do not talk to you about saying no to alcohol, or supervise your activities. ? You are impulsive or you have trouble with self-control.  What are the signs or symptoms? Symptoms of this condition include:  Drinkingmore than you want to.  Drinking for longer than you want to.  Trying several times to drink less or to control your drinking.  Spending a lot of time getting alcohol, drinking, or recovering from drinking.  Craving alcohol.  Having problems at work, at school, or at home due to drinking.  Having problems in relationships due to drinking.  Drinking when it is dangerous to drink, such as before  driving a car.  Continuing to drink even though you know you might have a physical or mental problem related to drinking.  Needing more and more alcohol to get the same effect you want from the alcohol (building up tolerance).  Having symptoms of withdrawal when you stop drinking. Symptoms of withdrawal include: ? Fatigue. ? Nightmares. ? Trouble sleeping. ? Depression. ? Anxiety. ? Fever. ? Seizures. ? Severe confusion. ? Feeling or seeing things that are not there (hallucinations). ? Tremors. ? Rapid heart rate. ? Rapid breathing. ? High blood pressure.  Drinking to avoid symptoms of withdrawal.  How is this diagnosed? This condition is diagnosed with an assessment. Your health care provider may start the assessment by asking three or four questions about your drinking. Your health care provider may perform a physical exam or do lab tests to see if you have physical problems resulting from alcohol use. She or he may refer you to a mental health professional for evaluation. How is this treated? Some people with alcohol use disorder are able to reduce their alcohol use to low-risk levels. Others need to completely quit drinking alcohol. When necessary, mental health professionals with specialized training in substance use treatment can help. Your health care provider can help you decide how severe your alcohol use disorder is and what type of treatment you need. The following forms of treatment are available:  Detoxification. Detoxification involves quitting drinking and using prescription medicines within the first week to help lessen withdrawal symptoms. This treatment is important for people who have had withdrawal symptoms before and for   heavy drinkers who are likely to have withdrawal symptoms. Alcohol withdrawal can be dangerous, and in severe cases, it can cause death. Detoxification may be provided in a home, community, or primary care setting, or in a hospital or substance use  treatment facility.  Counseling. This treatment is also called talk therapy. It is provided by substance use treatment counselors. A counselor can address the reasons you use alcohol and suggest ways to keep you from drinking again or to prevent problem drinking. The goals of talk therapy are to: ? Find healthy activities and ways for you to cope with stress. ? Identify and avoid the things that trigger your alcohol use. ? Help you learn how to handle cravings.  Medicines.Medicines can help treat alcohol use disorder by: ? Decreasing alcohol cravings. ? Decreasing the positive feeling you have when you drink alcohol. ? Causing an uncomfortable physical reaction when you drink alcohol (aversion therapy).  Support groups. Support groups are led by people who have quit drinking. They provide emotional support, advice, and guidance.  These forms of treatment are often combined. Some people with this condition benefit from a combination of treatments provided by specialized substance use treatment centers. Follow these instructions at home:  Take over-the-counter and prescription medicines only as told by your health care provider.  Check with your health care provider before starting any new medicines.  Ask friends and family members not to offer you alcohol.  Avoid situations where alcohol is served, including gatherings where others are drinking alcohol.  Create a plan for what to do when you are tempted to use alcohol.  Find hobbies or activities that you enjoy that do not include alcohol.  Keep all follow-up visits as told by your health care provider. This is important. How is this prevented?  If you drink, limit alcohol intake to no more than 1 drink a day for nonpregnant women and 2 drinks a day for men. One drink equals 12 oz of beer, 5 oz of wine, or 1 oz of hard liquor.  If you have a mental health condition, get treatment and support.  Do not give alcohol to  adolescents.  If you are an adolescent: ? Do not drink alcohol. ? Do not be afraid to say no if someone offers you alcohol. Speak up about why you do not want to drink. You can be a positive role model for your friends and set a good example for those around you by not drinking alcohol. ? If your friends drink, spend time with others who do not drink alcohol. Make new friends who do not use alcohol. ? Find healthy ways to manage stress and emotions, such as meditation or deep breathing, exercise, spending time in nature, listening to music, or talking with a trusted friend or family member. Contact a health care provider if:  You are not able to take your medicines as told.  Your symptoms get worse.  You return to drinking alcohol (relapse) and your symptoms get worse. Get help right away if:  You have thoughts about hurting yourself or others. If you ever feel like you may hurt yourself or others, or have thoughts about taking your own life, get help right away. You can go to your nearest emergency department or call:  Your local emergency services (911 in the [place]  A suicide crisis helpline, such as the National Suicide Prevention Lifeline at 1-800-273-8255. This is open 24 hours a day.  Summary  Alcohol use disorder is when your   drinking disrupts your daily life. When you have this condition, you drink too much alcohol and you cannot control your drinking.  Treatment may include detoxification, counseling, medicine, and support groups.  Ask friends and family members not to offer you alcohol. Avoid situations where alcohol is served.  Get help right away if you have thoughts about hurting yourself or others. This information is not intended to replace advice given to you by your health care provider. Make sure you discuss any questions you have with your health care provider. Document Released: 04/23/2004 Document Revised: 12/12/2015 Document Reviewed: 12/12/2015 Elsevier  Interactive Patient Education  2018 [employer] Ethanol Test Why am I having this test? This test is used to determine whether your blood alcohol level can impair your ability to drive. It can also determine the possibility of overdose. What kind of sample is taken? A blood sample is required for this test. It is usually collected by inserting a needle into a vein. How do I prepare for this test? There is no preparation required for this test. What are the reference ranges? Reference ranges are considered healthy ranges established after testing a large group of healthy people. Reference ranges may vary among different people, labs, and hospitals. It is your responsibility to obtain your test results. Ask the lab or department performing the test when and how you will get your test results. Reference ranges are 0-50 mg/dL or 3-6.64%0-0.05%. What do the results mean? If your results are:  Greater than or equal to 80 mg/dL or 4.03%0.08%, this indicates legal intoxication.  80-400 mg/dL or 4.74-2.5%0.08-0.4%, this means that you have increased intoxication. You may also have depressed central nervous response.  Greater than 400 mg/dL or 9.5%0.4%, this indicates possible alcohol overdose.  Talk with your health care provider to discuss your results, treatment options, and if necessary, the need for more tests. Talk with your health care provider if you have any questions about your results. Talk with your health care provider to discuss your results, treatment options, and if necessary, the need for more tests. Talk with your health care provider if you have any questions about your results. This information is not intended to replace advice given to you by your health care provider. Make sure you discuss any questions you have with your health care provider. Document Released: 04/08/2004 Document Revised: 11/19/2015 Document Reviewed: 09/05/2013 Elsevier Interactive Patient Education  2018 [employer]

## 2017-02-26 NOTE — Addendum Note (Signed)
Addended by: Loraine GripWORKMAN, Marlette Curvin S on: 02/26/2017 03:11 PM   Modules accepted: Orders

## 2017-02-26 NOTE — Progress Notes (Signed)
Did not see pt in warehouse this afternoon and no response to first overhead page this morning. Paged again with response. Pt to clinic. Asked him again to bring psych admission d/c info to clinic so RN could assist him with scheduling mental health f/u. He reports he does not have the paperwork with him. Left it at home.   Also forgot to bring [place] patch with him. Sts he will try to apply it himself "this weekend." Instructed pt to apply it as soon as he got home today so he could begin using it asap and dose his insulin appropriately.  Reports he took Lantus and other meds this am, but not Humalog shot. Same for last night. Last inj Humalog was yesterday in clinic with RN assistance.   Dinner last night: "I don't remember. Probably something out of a can." Breakfast today: sausage biscuit. Heading to lunch now.  Encouraged pt to apply patch, take meds this weekend, bring paperwork to clinic on Monday for mental health f/u scheduling.   Pt does not have strong smell of alcohol or aftershave/cologne today.

## 2017-03-01 ENCOUNTER — Emergency Department (HOSPITAL_COMMUNITY): Payer: No Typology Code available for payment source

## 2017-03-01 ENCOUNTER — Encounter (HOSPITAL_COMMUNITY): Payer: Self-pay | Admitting: Emergency Medicine

## 2017-03-01 ENCOUNTER — Other Ambulatory Visit: Payer: Self-pay

## 2017-03-01 DIAGNOSIS — K3184 Gastroparesis: Secondary | ICD-10-CM | POA: Diagnosis present

## 2017-03-01 DIAGNOSIS — K219 Gastro-esophageal reflux disease without esophagitis: Secondary | ICD-10-CM | POA: Diagnosis present

## 2017-03-01 DIAGNOSIS — Z794 Long term (current) use of insulin: Secondary | ICD-10-CM

## 2017-03-01 DIAGNOSIS — F17210 Nicotine dependence, cigarettes, uncomplicated: Secondary | ICD-10-CM | POA: Diagnosis present

## 2017-03-01 DIAGNOSIS — E876 Hypokalemia: Secondary | ICD-10-CM | POA: Diagnosis not present

## 2017-03-01 DIAGNOSIS — K760 Fatty (change of) liver, not elsewhere classified: Secondary | ICD-10-CM | POA: Diagnosis present

## 2017-03-01 DIAGNOSIS — M79604 Pain in right leg: Secondary | ICD-10-CM | POA: Diagnosis present

## 2017-03-01 DIAGNOSIS — E7800 Pure hypercholesterolemia, unspecified: Secondary | ICD-10-CM | POA: Diagnosis present

## 2017-03-01 DIAGNOSIS — E039 Hypothyroidism, unspecified: Secondary | ICD-10-CM | POA: Diagnosis present

## 2017-03-01 DIAGNOSIS — Z9114 Patient's other noncompliance with medication regimen: Secondary | ICD-10-CM

## 2017-03-01 DIAGNOSIS — Z888 Allergy status to other drugs, medicaments and biological substances status: Secondary | ICD-10-CM

## 2017-03-01 DIAGNOSIS — E1110 Type 2 diabetes mellitus with ketoacidosis without coma: Secondary | ICD-10-CM | POA: Diagnosis not present

## 2017-03-01 DIAGNOSIS — E860 Dehydration: Secondary | ICD-10-CM | POA: Diagnosis present

## 2017-03-01 DIAGNOSIS — E785 Hyperlipidemia, unspecified: Secondary | ICD-10-CM | POA: Diagnosis present

## 2017-03-01 DIAGNOSIS — D696 Thrombocytopenia, unspecified: Secondary | ICD-10-CM | POA: Diagnosis present

## 2017-03-01 DIAGNOSIS — E1143 Type 2 diabetes mellitus with diabetic autonomic (poly)neuropathy: Secondary | ICD-10-CM | POA: Diagnosis present

## 2017-03-01 DIAGNOSIS — Z9049 Acquired absence of other specified parts of digestive tract: Secondary | ICD-10-CM

## 2017-03-01 DIAGNOSIS — G4733 Obstructive sleep apnea (adult) (pediatric): Secondary | ICD-10-CM | POA: Diagnosis present

## 2017-03-01 DIAGNOSIS — F319 Bipolar disorder, unspecified: Secondary | ICD-10-CM | POA: Diagnosis present

## 2017-03-01 DIAGNOSIS — G8929 Other chronic pain: Secondary | ICD-10-CM | POA: Diagnosis present

## 2017-03-01 DIAGNOSIS — R197 Diarrhea, unspecified: Secondary | ICD-10-CM | POA: Diagnosis present

## 2017-03-01 DIAGNOSIS — Z87442 Personal history of urinary calculi: Secondary | ICD-10-CM

## 2017-03-01 DIAGNOSIS — E559 Vitamin D deficiency, unspecified: Secondary | ICD-10-CM | POA: Diagnosis present

## 2017-03-01 DIAGNOSIS — I10 Essential (primary) hypertension: Secondary | ICD-10-CM | POA: Diagnosis present

## 2017-03-01 DIAGNOSIS — M79605 Pain in left leg: Secondary | ICD-10-CM | POA: Diagnosis present

## 2017-03-01 DIAGNOSIS — F1010 Alcohol abuse, uncomplicated: Secondary | ICD-10-CM | POA: Diagnosis present

## 2017-03-01 DIAGNOSIS — Z881 Allergy status to other antibiotic agents status: Secondary | ICD-10-CM

## 2017-03-01 DIAGNOSIS — F419 Anxiety disorder, unspecified: Secondary | ICD-10-CM | POA: Diagnosis present

## 2017-03-01 DIAGNOSIS — Z885 Allergy status to narcotic agent status: Secondary | ICD-10-CM

## 2017-03-01 LAB — CBC
HCT: 39.2 % (ref 39.0–52.0)
HEMOGLOBIN: 14.1 g/dL (ref 13.0–17.0)
MCH: 33.3 pg (ref 26.0–34.0)
MCHC: 36 g/dL (ref 30.0–36.0)
MCV: 92.5 fL (ref 78.0–100.0)
Platelets: 130 10*3/uL — ABNORMAL LOW (ref 150–400)
RBC: 4.24 MIL/uL (ref 4.22–5.81)
RDW: 13.2 % (ref 11.5–15.5)
WBC: 8.3 10*3/uL (ref 4.0–10.5)

## 2017-03-01 LAB — I-STAT TROPONIN, ED: TROPONIN I, POC: 0.01 ng/mL (ref 0.00–0.08)

## 2017-03-01 NOTE — ED Triage Notes (Signed)
Patient reports chest pressure/heaviness this morning with emesis , denies SOB , no diaphoresis .

## 2017-03-02 ENCOUNTER — Telehealth: Payer: Self-pay | Admitting: Registered Nurse

## 2017-03-02 ENCOUNTER — Inpatient Hospital Stay (HOSPITAL_COMMUNITY)
Admission: EM | Admit: 2017-03-02 | Discharge: 2017-03-05 | DRG: 639 | Disposition: A | Discharge status: Home Health | Payer: No Typology Code available for payment source | Attending: Internal Medicine | Admitting: Internal Medicine

## 2017-03-02 ENCOUNTER — Encounter: Payer: Self-pay | Admitting: Registered Nurse

## 2017-03-02 DIAGNOSIS — E1143 Type 2 diabetes mellitus with diabetic autonomic (poly)neuropathy: Secondary | ICD-10-CM | POA: Diagnosis present

## 2017-03-02 DIAGNOSIS — D696 Thrombocytopenia, unspecified: Secondary | ICD-10-CM | POA: Diagnosis present

## 2017-03-02 DIAGNOSIS — Z9114 Patient's other noncompliance with medication regimen: Secondary | ICD-10-CM | POA: Diagnosis not present

## 2017-03-02 DIAGNOSIS — M79605 Pain in left leg: Secondary | ICD-10-CM

## 2017-03-02 DIAGNOSIS — Z91148 Patient's other noncompliance with medication regimen for other reason: Secondary | ICD-10-CM | POA: Diagnosis present

## 2017-03-02 DIAGNOSIS — E7800 Pure hypercholesterolemia, unspecified: Secondary | ICD-10-CM | POA: Diagnosis not present

## 2017-03-02 DIAGNOSIS — Z794 Long term (current) use of insulin: Secondary | ICD-10-CM | POA: Diagnosis not present

## 2017-03-02 DIAGNOSIS — F1010 Alcohol abuse, uncomplicated: Secondary | ICD-10-CM | POA: Diagnosis present

## 2017-03-02 DIAGNOSIS — F319 Bipolar disorder, unspecified: Secondary | ICD-10-CM | POA: Diagnosis present

## 2017-03-02 DIAGNOSIS — Z888 Allergy status to other drugs, medicaments and biological substances status: Secondary | ICD-10-CM | POA: Diagnosis not present

## 2017-03-02 DIAGNOSIS — G8929 Other chronic pain: Secondary | ICD-10-CM | POA: Diagnosis not present

## 2017-03-02 DIAGNOSIS — E1310 Other specified diabetes mellitus with ketoacidosis without coma: Secondary | ICD-10-CM | POA: Diagnosis not present

## 2017-03-02 DIAGNOSIS — E039 Hypothyroidism, unspecified: Secondary | ICD-10-CM | POA: Diagnosis present

## 2017-03-02 DIAGNOSIS — E860 Dehydration: Secondary | ICD-10-CM | POA: Diagnosis present

## 2017-03-02 DIAGNOSIS — K219 Gastro-esophageal reflux disease without esophagitis: Secondary | ICD-10-CM | POA: Diagnosis not present

## 2017-03-02 DIAGNOSIS — E1110 Type 2 diabetes mellitus with ketoacidosis without coma: Secondary | ICD-10-CM | POA: Diagnosis not present

## 2017-03-02 DIAGNOSIS — M79604 Pain in right leg: Secondary | ICD-10-CM

## 2017-03-02 DIAGNOSIS — Z87442 Personal history of urinary calculi: Secondary | ICD-10-CM | POA: Diagnosis not present

## 2017-03-02 DIAGNOSIS — G4733 Obstructive sleep apnea (adult) (pediatric): Secondary | ICD-10-CM | POA: Diagnosis present

## 2017-03-02 DIAGNOSIS — I10 Essential (primary) hypertension: Secondary | ICD-10-CM | POA: Diagnosis not present

## 2017-03-02 DIAGNOSIS — K760 Fatty (change of) liver, not elsewhere classified: Secondary | ICD-10-CM | POA: Diagnosis present

## 2017-03-02 DIAGNOSIS — Z9049 Acquired absence of other specified parts of digestive tract: Secondary | ICD-10-CM | POA: Diagnosis not present

## 2017-03-02 DIAGNOSIS — E559 Vitamin D deficiency, unspecified: Secondary | ICD-10-CM | POA: Diagnosis present

## 2017-03-02 DIAGNOSIS — E785 Hyperlipidemia, unspecified: Secondary | ICD-10-CM | POA: Diagnosis present

## 2017-03-02 DIAGNOSIS — Z881 Allergy status to other antibiotic agents status: Secondary | ICD-10-CM | POA: Diagnosis not present

## 2017-03-02 DIAGNOSIS — F419 Anxiety disorder, unspecified: Secondary | ICD-10-CM | POA: Diagnosis present

## 2017-03-02 DIAGNOSIS — Z885 Allergy status to narcotic agent status: Secondary | ICD-10-CM | POA: Diagnosis not present

## 2017-03-02 DIAGNOSIS — F17210 Nicotine dependence, cigarettes, uncomplicated: Secondary | ICD-10-CM | POA: Diagnosis present

## 2017-03-02 HISTORY — DX: Sleep apnea, unspecified: G47.30

## 2017-03-02 HISTORY — DX: Type 2 diabetes mellitus with ketoacidosis without coma: E11.10

## 2017-03-02 LAB — BASIC METABOLIC PANEL
ANION GAP: 29 — AB (ref 5–15)
Anion gap: 17 — ABNORMAL HIGH (ref 5–15)
Anion gap: 12 (ref 5–15)
Anion gap: 11 (ref 5–15)
BUN: 11 mg/dL (ref 6–20)
BUN: 5 mg/dL — AB (ref 6–20)
BUN: 6 mg/dL (ref 6–20)
CALCIUM: 11 mg/dL — AB (ref 8.9–10.3)
CALCIUM: 8.1 mg/dL — AB (ref 8.9–10.3)
CALCIUM: 8.2 mg/dL — AB (ref 8.9–10.3)
CO2: 28 mmol/L (ref 22–32)
CO2: 27 mmol/L (ref 22–32)
CO2: 32 mmol/L (ref 22–32)
CO2: 33 mmol/L — ABNORMAL HIGH (ref 22–32)
CREATININE: 0.9 mg/dL (ref 0.61–1.24)
CREATININE: 0.72 mg/dL (ref 0.61–1.24)
Calcium: 8.3 mg/dL — ABNORMAL LOW (ref 8.9–10.3)
Chloride: 74 mmol/L — ABNORMAL LOW (ref 101–111)
Chloride: 88 mmol/L — ABNORMAL LOW (ref 101–111)
Chloride: 90 mmol/L — ABNORMAL LOW (ref 101–111)
Chloride: 89 mmol/L — ABNORMAL LOW (ref 101–111)
Creatinine, Ser: 0.84 mg/dL (ref 0.61–1.24)
Creatinine, Ser: 0.75 mg/dL (ref 0.61–1.24)
GFR calc Af Amer: >60 mL/min (ref >60)
GFR calc Af Amer: >60 mL/min (ref >60)
GLUCOSE: 321 mg/dL — AB (ref 65–99)
GLUCOSE: 145 mg/dL — AB (ref 65–99)
Glucose, Bld: 593 mg/dL (ref 65–99)
Glucose, Bld: 144 mg/dL — ABNORMAL HIGH (ref 65–99)
POTASSIUM: 3.5 mmol/L (ref 3.5–5.1)
Potassium: 4 mmol/L (ref 3.5–5.1)
Potassium: 4 mmol/L (ref 3.5–5.1)
Potassium: 3.4 mmol/L — ABNORMAL LOW (ref 3.5–5.1)
SODIUM: 131 mmol/L — AB (ref 135–145)
SODIUM: 133 mmol/L — AB (ref 135–145)
Sodium: 132 mmol/L — ABNORMAL LOW (ref 135–145)
Sodium: 134 mmol/L — ABNORMAL LOW (ref 135–145)

## 2017-03-02 LAB — CBG MONITORING, ED
GLUCOSE-CAPILLARY: 489 mg/dL — AB (ref 65–99)
GLUCOSE-CAPILLARY: 167 mg/dL — AB (ref 65–99)
GLUCOSE-CAPILLARY: 125 mg/dL — AB (ref 65–99)
Glucose-Capillary: 467 mg/dL — ABNORMAL HIGH (ref 65–99)
Glucose-Capillary: 380 mg/dL — ABNORMAL HIGH (ref 65–99)
Glucose-Capillary: 341 mg/dL — ABNORMAL HIGH (ref 65–99)
Glucose-Capillary: 234 mg/dL — ABNORMAL HIGH (ref 65–99)
Glucose-Capillary: 232 mg/dL — ABNORMAL HIGH (ref 65–99)

## 2017-03-02 LAB — RAPID URINE DRUG SCREEN, HOSP PERFORMED
AMPHETAMINES: NOT DETECTED
Barbiturates: NOT DETECTED
Benzodiazepines: NOT DETECTED
Cocaine: NOT DETECTED
OPIATES: NOT DETECTED
Tetrahydrocannabinol: NOT DETECTED

## 2017-03-02 LAB — ETHANOL

## 2017-03-02 LAB — PHOSPHORUS: Phosphorus: 3.3 mg/dL (ref 2.5–4.6)

## 2017-03-02 LAB — MAGNESIUM: MAGNESIUM: 1.2 mg/dL — AB (ref 1.7–2.4)

## 2017-03-02 LAB — GLUCOSE, CAPILLARY: Glucose-Capillary: 141 mg/dL — ABNORMAL HIGH (ref 65–99)

## 2017-03-02 MED ORDER — SODIUM CHLORIDE 0.9 % IV BOLUS (SEPSIS)
3000.0000 mL | Freq: Once | INTRAVENOUS | Status: AC
  Administered 2017-03-02: 3000 mL via INTRAVENOUS

## 2017-03-02 MED ORDER — CLONAZEPAM 1 MG PO TABS
1.0000 mg | ORAL_TABLET | Freq: Two times a day (BID) | ORAL | Status: DC | PRN
  Administered 2017-03-03 – 2017-03-04 (×2): 1 mg via ORAL
  Filled 2017-03-02 (×2): qty 1

## 2017-03-02 MED ORDER — POTASSIUM CHLORIDE 10 MEQ/100ML IV SOLN
10.0000 meq | INTRAVENOUS | Status: AC
  Administered 2017-03-02 (×2): 10 meq via INTRAVENOUS
  Filled 2017-03-02 (×2): qty 100

## 2017-03-02 MED ORDER — HYDROXYZINE PAMOATE 50 MG PO CAPS
50.0000 mg | ORAL_CAPSULE | Freq: Two times a day (BID) | ORAL | Status: DC | PRN
  Administered 2017-03-02: 50 mg via ORAL
  Filled 2017-03-02: qty 1

## 2017-03-02 MED ORDER — INSULIN ASPART 100 UNIT/ML ~~LOC~~ SOLN
0.0000 [IU] | Freq: Three times a day (TID) | SUBCUTANEOUS | Status: DC
  Administered 2017-03-02: 2 [IU] via SUBCUTANEOUS
  Administered 2017-03-03 (×2): 3 [IU] via SUBCUTANEOUS
  Administered 2017-03-04: 2 [IU] via SUBCUTANEOUS
  Administered 2017-03-05: 3 [IU] via SUBCUTANEOUS
  Administered 2017-03-05: 5 [IU] via SUBCUTANEOUS
  Filled 2017-03-02: qty 1

## 2017-03-02 MED ORDER — THIAMINE HCL 100 MG/ML IJ SOLN: 100.0000 mg | Freq: Every day | INTRAMUSCULAR | Status: DC

## 2017-03-02 MED ORDER — DULOXETINE HCL 30 MG PO CPEP
30.0000 mg | ORAL_CAPSULE | Freq: Two times a day (BID) | ORAL | Status: DC
  Filled 2017-03-02: qty 1

## 2017-03-02 MED ORDER — INSULIN GLARGINE 100 UNIT/ML ~~LOC~~ SOLN
22.0000 [IU] | Freq: Two times a day (BID) | SUBCUTANEOUS | Status: DC
  Administered 2017-03-02 – 2017-03-05 (×6): 22 [IU] via SUBCUTANEOUS
  Filled 2017-03-02 (×7): qty 0.22

## 2017-03-02 MED ORDER — VITAMIN B-1 100 MG PO TABS
100.0000 mg | ORAL_TABLET | Freq: Every day | ORAL | Status: DC
  Administered 2017-03-02 – 2017-03-05 (×4): 100 mg via ORAL
  Filled 2017-03-02 (×4): qty 1

## 2017-03-02 MED ORDER — METHOCARBAMOL 500 MG PO TABS
500.0000 mg | ORAL_TABLET | Freq: Two times a day (BID) | ORAL | Status: DC | PRN
  Administered 2017-03-02 – 2017-03-04 (×3): 500 mg via ORAL
  Filled 2017-03-02 (×3): qty 1

## 2017-03-02 MED ORDER — LAMOTRIGINE 25 MG PO TABS
25.0000 mg | ORAL_TABLET | Freq: Every day | ORAL | Status: DC
  Administered 2017-03-03 – 2017-03-05 (×3): 25 mg via ORAL
  Filled 2017-03-02 (×4): qty 1

## 2017-03-02 MED ORDER — LORAZEPAM 2 MG/ML IJ SOLN: 1.0000 mg | Freq: Four times a day (QID) | INTRAMUSCULAR | Status: DC | PRN

## 2017-03-02 MED ORDER — PANTOPRAZOLE SODIUM 40 MG IV SOLR
40.0000 mg | INTRAVENOUS | Status: DC
Start: 2017-03-02 — End: 2017-03-03
  Administered 2017-03-02: 40 mg via INTRAVENOUS
  Filled 2017-03-02: qty 40

## 2017-03-02 MED ORDER — INSULIN ASPART 100 UNIT/ML ~~LOC~~ SOLN
0.0000 [IU] | Freq: Every day | SUBCUTANEOUS | Status: DC
  Administered 2017-03-04: 3 [IU] via SUBCUTANEOUS

## 2017-03-02 MED ORDER — LORAZEPAM 1 MG PO TABS: 1.0000 mg | ORAL_TABLET | Freq: Four times a day (QID) | ORAL | Status: DC | PRN

## 2017-03-02 MED ORDER — GABAPENTIN 600 MG PO TABS
600.0000 mg | ORAL_TABLET | Freq: Three times a day (TID) | ORAL | Status: DC
  Administered 2017-03-02 – 2017-03-05 (×10): 600 mg via ORAL
  Filled 2017-03-02 (×12): qty 1

## 2017-03-02 MED ORDER — KETOROLAC TROMETHAMINE 30 MG/ML IJ SOLN
15.0000 mg | Freq: Once | INTRAMUSCULAR | Status: AC
  Administered 2017-03-02: 15 mg via INTRAVENOUS
  Filled 2017-03-02: qty 1

## 2017-03-02 MED ORDER — ARIPIPRAZOLE 10 MG PO TABS
5.0000 mg | ORAL_TABLET | Freq: Every day | ORAL | Status: DC
  Administered 2017-03-02 – 2017-03-05 (×4): 5 mg via ORAL
  Filled 2017-03-02 (×4): qty 1

## 2017-03-02 MED ORDER — NABUMETONE 500 MG PO TABS
500.0000 mg | ORAL_TABLET | Freq: Two times a day (BID) | ORAL | Status: DC
  Administered 2017-03-02 – 2017-03-05 (×7): 500 mg via ORAL
  Filled 2017-03-02 (×8): qty 1

## 2017-03-02 MED ORDER — SODIUM CHLORIDE 0.9 % IV SOLN
INTRAVENOUS | Status: DC
  Administered 2017-03-02: 3.2 [IU]/h via INTRAVENOUS
  Filled 2017-03-02: qty 1

## 2017-03-02 MED ORDER — ENOXAPARIN SODIUM 40 MG/0.4ML ~~LOC~~ SOLN
40.0000 mg | SUBCUTANEOUS | Status: DC
  Administered 2017-03-02 – 2017-03-05 (×4): 40 mg via SUBCUTANEOUS
  Filled 2017-03-02 (×4): qty 0.4

## 2017-03-02 MED ORDER — DULOXETINE HCL 30 MG PO CPEP
30.0000 mg | ORAL_CAPSULE | Freq: Every day | ORAL | Status: DC
  Administered 2017-03-02 – 2017-03-04 (×3): 30 mg via ORAL
  Filled 2017-03-02 (×3): qty 1

## 2017-03-02 MED ORDER — FOLIC ACID 1 MG PO TABS
1.0000 mg | ORAL_TABLET | Freq: Every day | ORAL | Status: DC
  Administered 2017-03-02 – 2017-03-05 (×4): 1 mg via ORAL
  Filled 2017-03-02 (×4): qty 1

## 2017-03-02 MED ORDER — DEXTROSE-NACL 5-0.45 % IV SOLN
INTRAVENOUS | Status: DC
  Administered 2017-03-02: 14:00:00 via INTRAVENOUS

## 2017-03-02 MED ORDER — NALTREXONE HCL 50 MG PO TABS
50.0000 mg | ORAL_TABLET | Freq: Every day | ORAL | Status: DC
  Administered 2017-03-02 – 2017-03-05 (×4): 50 mg via ORAL
  Filled 2017-03-02 (×4): qty 1

## 2017-03-02 MED ORDER — ADULT MULTIVITAMIN W/MINERALS CH
1.0000 | ORAL_TABLET | Freq: Every day | ORAL | Status: DC
  Administered 2017-03-02 – 2017-03-05 (×4): 1 via ORAL
  Filled 2017-03-02 (×4): qty 1

## 2017-03-02 MED ORDER — DULOXETINE HCL 60 MG PO CPEP
60.0000 mg | ORAL_CAPSULE | Freq: Every day | ORAL | Status: DC
  Administered 2017-03-02 – 2017-03-05 (×4): 60 mg via ORAL
  Filled 2017-03-02 (×4): qty 1

## 2017-03-02 MED ORDER — SODIUM CHLORIDE 0.9 % IV SOLN
INTRAVENOUS | Status: DC
  Administered 2017-03-02: 150 mL/h via INTRAVENOUS
  Administered 2017-03-03 – 2017-03-04 (×3): via INTRAVENOUS

## 2017-03-02 NOTE — ED Provider Notes (Signed)
[facility] EMERGENCY DEPARTMENT Provider Note   CSN: [id number] Arrival date & time: 03/01/17  2245     History   Chief Complaint Chief Complaint  Patient presents with  . Chest Pain    HPI Terry Schultz is a 46 y.o. male. He is here for evaluation of chest discomfort, associated with vomiting.  He was admitted to the hospital 2 weeks ago with endocrine crisis consisting of DKA, hypokalemia and hypomagnesemia.  He was hospitalized for 3 days with improvement.  Subsequent to hospitalization he is followed up with his PCP, and been noted to continue to drink alcohol, and take his insulin sporadically.  Patient is bothered by congestion, head, with postnasal drip, and subsequent coughing and occasional vomiting for several days.  He feels like these discomforts have caused him to have chest pain.  He does not have a prior cardiac history.  He complains of chronic pain in his legs, bilaterally which is being treated with gabapentin, for 7 or 8 months.  He states he sometimes does not take his insulin, because the injection with the needle, "hurts."  He gets much of his medical care, while he is at work, from [employer] or PA who is at his worksite.  He states that he has been able to go to work, but does not feel he functions very well there.  He complains of chronic, occasional, fecal incontinence.  He states that he has been abstinent from alcohol since June, 2018.  There are no other known modifying factors.  HPI  Past Medical History:  Diagnosis Date  . Alcoholism ([place])   . Anxiety   . Bipolar disorder ([place])   . Depression   . GERD (gastroesophageal reflux disease)   . High cholesterol   . History of kidney stones   . Hypertension   . Pancreatitis   . Pneumonia ~ 2012  . Type II diabetes mellitus [facility])     Patient Active Problem List   Diagnosis Date Noted  . MDD (major depressive disorder), single episode, moderate ([place]) 02/12/2017  . Hyperglycemia  02/11/2017  . Hypokalemia 01/21/2017  . Diarrhea 01/21/2017  . Pressure injury of skin 01/20/2017  . Protein-calorie malnutrition, severe 01/20/2017  . DKA (diabetic ketoacidoses) ([place]) 12/05/2016  . Depression with anxiety 12/05/2016  . HLD (hyperlipidemia) 12/05/2016  . Chronic back pain 12/05/2016  . Alcoholism ([place])   . Hypomagnesemia 05/29/2016  . Cervical myelopathy ([place]) 05/28/2016  . Ambulatory dysfunction 05/27/2016  . Hyperglycemia, unspecified 05/27/2016  . Numbness and tingling of both legs 05/27/2016  . Chronic pain of both knees 05/21/2016  . Diabetic peripheral neuropathy ([place]) 05/21/2016  . Moderate episode of recurrent major depressive disorder ([place]) 05/22/2015  . Panic disorder 05/22/2015  . Hepatic steatosis 11/21/2013  . Nondependent alcohol abuse, in remission 09/30/2013  . Allergic rhinitis 09/30/2013  . Anxiety disorder 09/30/2013  . Chronic cholecystitis 09/30/2013  . Disorder of magnesium metabolism 09/30/2013  . Essential hypertension 09/30/2013  . Familial hypertriglyceridemia 09/30/2013  . Hypercholesterolemia 09/30/2013  . Hypothyroidism 09/30/2013  . Insomnia 09/30/2013  . Low testosterone 09/30/2013  . OSA (obstructive sleep apnea) 09/30/2013  . Pancreatic pseudocyst 09/30/2013  . Depressive disorder 09/30/2013  . Recurrent pancreatitis ([place]) 09/30/2013  . Uncontrolled type 2 diabetes mellitus with diabetic polyneuropathy, with long-term current use of insulin ([place]) 09/30/2013  . Vitamin D deficiency 09/30/2013    Past Surgical History:  Procedure Laterality Date  . CYSTOSCOPY W/ STONE MANIPULATION  X 1   "  there's still stones in there" (02/11/2017)  . LAPAROSCOPIC CHOLECYSTECTOMY         Home Medications    Prior to Admission medications   Medication Sig Start Date End Date Taking? Authorizing Provider  ARIPiprazole (ABILIFY) 5 MG tablet Take 5 mg by mouth daily.    [provider]  atorvastatin (LIPITOR) 10 MG tablet Take  10 mg daily by mouth. 08/17/16   [provider]  BAYER MICROLET LANCETS lancets 1 each by Other route 4 (four) times daily. Puncture skin 01/21/17   Dessa Phi, DO  blood glucose meter kit and supplies Dispense based on patient and insurance preference. Use up to four times daily as directed. (FOR ICD-9 250.00, 250.01). 01/21/17   Dessa Phi, DO  carvedilol (COREG) 12.5 MG tablet Take 12.5 mg by mouth 2 (two) times daily. 07/11/15   [provider]  clonazePAM (KLONOPIN) 1 MG tablet Take 1 mg 2 (two) times daily as needed by mouth. 12/30/15   [provider]  Continuous Blood Gluc Sensor MISC 1 each as directed by Does not apply route. Use as directed every 10 days. May dispense FreeStyle [employer] or similar. 02/04/17   Betancourt, Aura Fey, NP  DULoxetine (CYMBALTA) 30 MG capsule 60 mg(2 tabs) in morning, 30 mg(1 tab) in the evening 02/14/17   Oretha Milch D, MD  escitalopram (LEXAPRO) 20 MG tablet Take 1 tablet by mouth daily. 02/23/17   [provider]  fenofibrate 160 MG tablet Take 160 mg by mouth. 06/04/16   [provider]  fluticasone (FLONASE) 50 MCG/ACT nasal spray Place 1 spray into both nostrils 2 (two) times daily. 02/16/17 04/17/17  Betancourt, Aura Fey, NP  gabapentin (NEURONTIN) 600 MG tablet Take 1 tablet (600 mg total) 3 (three) times daily by mouth. 02/14/17   Oretha Milch D, MD  glucose blood (BAYER CONTOUR TEST) test strip 1 each by Other route 4 (four) times daily. 01/21/17   Dessa Phi, DO  hydrOXYzine (VISTARIL) 50 MG capsule Take 50 mg 2 (two) times daily as needed by mouth. 07/11/15   [provider]  Insulin Glargine (LANTUS SOLOSTAR) 100 UNIT/ML Solostar Pen Inject 22 Units into the skin daily. 01/21/17   Dessa Phi, DO  insulin lispro (HUMALOG) 100 UNIT/ML injection Inject 0-0.1 mLs (0-10 Units total) into the skin 3 (three) times daily before meals. Per sliding scale Patient taking differently:  Inject 24 Units 2 (two) times daily at 10 AM and 5 PM into the skin. Per sliding scale  01/21/17   Dessa Phi, DO  Insulin Pen Needle 31G X 5 MM MISC Use daily with lantus solostar pen 01/21/17   Dessa Phi, DO  Insulin Syringe-Needle U-100 30G X 1/2" 0.3 ML MISC 1 Syringe by Does not apply route 3 (three) times daily as needed (with meals). 01/21/17   Dessa Phi, DO  lamoTRIgine (LAMICTAL) 25 MG tablet Take 25 mg by mouth daily. 10/23/16   [provider]  loperamide (IMODIUM) 2 MG capsule Take 1 capsule (2 mg total) by mouth as needed for diarrhea or loose stools. 01/21/17   Dessa Phi, DO  methocarbamol (ROBAXIN) 500 MG tablet Take 500 mg by mouth 2 (two) times daily as needed. 11/09/16   [provider]  nabumetone (RELAFEN) 500 MG tablet Take 500 mg by mouth 2 (two) times daily.    [provider]  naltrexone (DEPADE) 50 MG tablet Take 1 tablet (50 mg total) daily by mouth. 02/14/17   Oretha Milch  D, MD  potassium chloride SA (K-DUR,KLOR-CON) 20 MEQ tablet Take 2 tablets (40 mEq total) daily by mouth. 02/14/17   Oretha Milch D, MD  sodium chloride (OCEAN) 0.65 % SOLN nasal spray Place 2 sprays into both nostrils every 2 (two) hours while awake. 02/16/17 03/18/17  Betancourt, Aura Fey, NP  traZODone (DESYREL) 50 MG tablet Take 1 tablet by mouth daily as needed. 11/23/16   [provider]    Family History Family History  Problem Relation Age of Onset  . Kidney Stones Father     Social History Social History   Tobacco Use  . Smoking status: Current Some Day Smoker    Packs/day: 0.12    Years: 7.00    Pack years: 0.84    Types: Cigarettes  . Smokeless tobacco: Never Used  Substance Use Topics  . Alcohol use: Yes    Comment: 02/11/2017 "stopped September 11, 2016"  . Drug use: No     Allergies   Metformin; Morphine; Penicillins; Amoxicillin; and Meperidine   Review of Systems Review of Systems  All other systems reviewed and are  negative.    Physical Exam Updated Vital Signs BP 112/74   Pulse 83   Temp 98.4 F (36.9 C) (Oral)   Resp 17   Ht _0  (1.702 m)   Wt 59 kg (130 lb)   SpO2 98%   BMI 20.36 kg/m   Physical Exam  Constitutional: He is oriented to person, place, and time. He appears well-developed. He appears ill.  Appears under nourished, and older than stated age.  HENT:  Head: Normocephalic and atraumatic.  Right Ear: External ear normal.  Left Ear: External ear normal.  Eyes: Conjunctivae and EOM are normal. Pupils are equal, round, and reactive to light.  Neck: Normal range of motion and phonation normal. Neck supple.  Cardiovascular: Normal rate, regular rhythm and normal heart sounds.  Pulmonary/Chest: Effort normal and breath sounds normal. He exhibits no bony tenderness.  Abdominal: Soft. He exhibits no mass. There is no tenderness.  Musculoskeletal: Normal range of motion.  Neurological: He is alert and oriented to person, place, and time. No cranial nerve deficit or sensory deficit. He exhibits normal muscle tone. Coordination normal.  Skin: Skin is warm, dry and intact.  Psychiatric: His behavior is normal. Judgment and thought content normal.  Appears depressed  Nursing note and vitals reviewed.    ED Treatments / Results  Labs (all labs ordered are listed, but only abnormal results are displayed) Labs Reviewed  BASIC METABOLIC PANEL - Abnormal; Notable for the following components:      Result Value   Sodium 131 (*)    Chloride 74 (*)    Glucose, Bld 593 (*)    Calcium 11.0 (*)    Anion gap 29 (*)    All other components within normal limits  CBC - Abnormal; Notable for the following components:   Platelets 130 (*)    All other components within normal limits  CBG MONITORING, ED - Abnormal; Notable for the following components:   Glucose-Capillary 489 (*)    All other components within normal limits  I-STAT TROPONIN, ED    EKG  EKG Interpretation None        Radiology Dg Chest 2 View  Result Date: 03/02/2017 CLINICAL DATA:  46 year old male with mid chest pain and shortness of breath. EXAM: CHEST  2 VIEW COMPARISON:  Chest radiograph dated 01/19/2017 FINDINGS: The lungs are clear. There is no pleural effusion or  pneumothorax. The cardiac silhouette is within normal limits. No acute osseous pathology. IMPRESSION: No active cardiopulmonary disease. Electronically Signed   By: Anner Crete M.D.   On: 03/02/2017 00:16    Procedures .Critical Care Performed by: Daleen Bo, MD Authorized by: Daleen Bo, MD   Critical care provider statement:    Critical care time (minutes):  35   Critical care start time:  03/02/2017 7:40 AM   Critical care end time:  03/02/2017 8:38 AM   Critical care time was exclusive of:  Separately billable procedures and treating other patients   Critical care was necessary to treat or prevent imminent or life-threatening deterioration of the following conditions:  Endocrine crisis   Critical care was time spent personally by me on the following activities:  Development of treatment plan with patient or surrogate, discussions with primary provider, examination of patient, obtaining history from patient or surrogate, pulse oximetry, re-evaluation of patient's condition and review of old charts   (including critical care time)  Medications Ordered in ED Medications  ketorolac (TORADOL) 30 MG/ML injection 15 mg (not administered)  sodium chloride 0.9 % bolus 3,000 mL (3,000 mLs Intravenous New Bag/Given 03/02/17 0810)     Initial Impression / Assessment and Plan / ED Course  I have reviewed the triage vital signs and the nursing notes.  Pertinent labs & imaging results that were available during my care of the patient were reviewed by me and considered in my medical decision making (see chart for details).  Clinical Course as of Mar 02 840  Tue Mar 02, 2017  0962 Low Sodium: (!) 131 [EW]  8366 Low  Chloride: (!) 74 [EW]  2947 Normal Potassium: 4.0 [EW]  6546 Elevated Glucose: (!!) 593 [EW]  0813 High Anion gap: (!) 29 [EW]  0841 NAD DG Chest 2 View [EW]    Clinical Course User Index [EW] Daleen Bo, MD     Patient Vitals for the past 24 hrs:  BP Temp Temp src Pulse Resp SpO2 Height Weight  03/02/17 0810 112/74 - - 83 17 98 % - -  03/02/17 0800 - - - 84 18 98 % - -  03/02/17 0745 - - - 92 19 100 % - -  03/02/17 0736 118/75 98.4 F (36.9 C) Oral 73 14 96 % - -  03/02/17 0506 113/72 98.2 F (36.8 C) Oral 77 16 98 % - -  03/01/17 2255 101/79 (!) 97.5 F (36.4 C) Oral 93 16 99 % _0  (1.702 m) 59 kg (130 lb)    8:14 AM Reevaluation with update and discussion. After initial assessment and treatment, an updated evaluation reveals no change in clinical status, findings discussed with the patient and all questions were answered. Daleen Bo   8:42 AM-Consult complete with hospitalist. Patient case explained and discussed.  She agrees to admit patient for further evaluation and treatment. Call ended at 9: 0 8 AM   Final Clinical Impressions(s) / ED Diagnoses   Final diagnoses:  Diabetic ketoacidosis without coma associated with other specified diabetes mellitus ([place])  Noncompliance with medications  Chronic pain of both lower extremities    Evaluation is consistent with DKA, dehydration, hypo-chloremia; likely precipitated by medication noncompliance.  The patient is likely depressed and is despondent over his chronic illness.  During the hospitalization 2 weeks ago his Cymbalta was titrated up, and naltrexone was started.  These were treatments for depression and avoidance of alcohol, respectively.  Nonspecific chest pain today, with reassuring evaluation for  absence of cardiac disorders.  The pain is likely multifactorial in etiology.  Nursing Notes Reviewed/ Care Coordinated Applicable Imaging Reviewed Interpretation of Laboratory Data incorporated into ED  treatment  Plan: [place]  ED Discharge Orders    None       Daleen Bo, MD 03/02/17 [phone]

## 2017-03-02 NOTE — ED Notes (Signed)
Admit provider at bedside 

## 2017-03-02 NOTE — Telephone Encounter (Signed)
Notified by RN Nance PewHaley Workman patient called HR Rep at work and stated he was [facility] and would not be in to work 01 Mar 2017.  Patient still in ER upon arrival to clinic 02 Mar 2017 at 0900 pending admission.  Will follow up with patient upon discharge and RN notified to schedule patient follow up appt with me when he is discharge.  RN Rolly SalterHaley verbalized understanding information and had no further questions at this time.

## 2017-03-02 NOTE — ED Notes (Signed)
Spoke with pharmacy to verify and send medications for patient.

## 2017-03-02 NOTE — Progress Notes (Signed)
Inpatient Diabetes Program Recommendations  AACE/ADA: New Consensus Statement on Inpatient Glycemic Control (2015)  Target Ranges:  Prepandial:   less than 140 mg/dL      Peak postprandial:   less than 180 mg/dL (1-2 hours)      Critically ill patients:  140 - 180 mg/dL   Lab Results  Component Value Date   GLUCAP 380 (H) 03/02/2017   HGBA1C 12.6 (H) 12/25/2016   Review of Glycemic Control  Inpatient Diabetes Program Recommendations:    DM Coordinators have seen patient multiple times in the past and have also tried to place patient on the FreeStyle Libre Continous glucose monitor study our department is doing to reduce fingersticks for glucose readings. However, patient was dismissed from study due to unable to get into contact with him for follow up. He was also encouraged from multiple healthcare professionals to follow up with us to no avail. Patient also admitted just recently with depression affecting how he cares for his Diabetes and other Chronic conditions.   Patient has to show that he is compliant checking glucose 4-6 times a day and be responsible and take medications as ordered in order to qualify for the pump to operate the pump as intended. At this time patient does not meet criteria for insulin pump, which also needs to be assessed by PCP/Endocrinologist.  Thanks,  Christena DeemShannon Ammaar Encina RN, MSN, [facility] Inpatient Diabetes Coordinator Team Pager [phone] (8a-5p)

## 2017-03-02 NOTE — ED Notes (Signed)
Date and time results received: 03/02/17  (use smartphrase ".now" to insert current time)  Test: Glucose Critical Value: 593  Name of Provider Notified: Dr Wilkie AyeHorton  Orders Received: None at this time.

## 2017-03-02 NOTE — H&P (Signed)
History and Physical    Terry Schultz MBE:[id number] DOB: 1970-12-05 DOA: 03/02/2017   PCP: Loraine Leriche., MD   Attending physician: Denton Brick  Patient coming from/Resides with: Private residence/alone  Chief Complaint: Chest heaviness with vomiting  HPI: Terry Schultz is a 46 y.o. male with medical history significant for diabetes mellitus 2 on insulin with frequent admissions for nonadherence to medical therapies, severe bipolar disorder, alcohol abuse, history of renal calculi.  Patient was last discharged on 11/18 after an admission for DKA secondary to nonadherence to administration of home insulins and was not checking sugars regularly.  Since that hospitalization he is regularly followed up with nurse practitioner at his job.  He has apparently been started on a freestyle libre glucose monitoring device which allows him to keep a specialized patch in place that allows the monitor to remotely detect CBGs without a fingerstick each occurrence.  Patient reports that during the weekdays the nurse at work administers his Lantus insulin but he is reluctant to give his injections due to reports of pain in fear of needles.  He admits to not taking his insulin for several days and states today is a day his libre patch runs out.  He was last evaluated by the work Designer, [employer]ellery on 11/29.  According to her note patient had not applied a new libre patch because he states he could not reach the back of his arm to apply it.  He also admitted to utilizing NyQuil every night for reported cough.  She also discussed extensively with him his plans regarding keeping his psychiatric and AA appointments.  He apparently is in the process of applying for personally that work as well.  Note he has not returned the alcohol rehab paperwork to the clinic.  Today he presents to the ER with reports of chest discomfort and an episode of emesis.  The chest discomfort was not related to exertion, he did  not have any shortness of breath or diaphoresis.  His EKG was unremarkable for any acute ischemic changes and his initial troponin was normal.  His initial glucose was 593 with an anion gap of 29.  EDP has ordered fluid boluses.  He is also been given an injection of Toradol for lower extremity pain.  ED Course:  Vital Signs: BP 124/79   Pulse 86   Temp 98.4 F (36.9 C) (Oral)   Resp 17   Ht _0  (1.702 m)   Wt 59 kg (130 lb)   SpO2 99%   BMI 20.36 kg/m  2 view CXR: Neg Lab data: Sodium 131, potassium 4.0, chloride 74, CO2 28, glucose 593, BUN 11, creatinine 0.9, calcium 11, anion gap 29, poc troponin 0 0.01, white count 8300 differential not obtained, hemoglobin 14.1, platelets 130,000 Medications and treatments: As above  Review of Systems:  In addition to the HPI above,  No Fever-chills, myalgias or other constitutional symptoms No Headache, changes with Vision or hearing, new weakness, tingling, numbness in any extremity,dysarthria or word finding difficulty, gait disturbance or imbalance, tremors or seizure activity No problems swallowing food or Liquids, indigestion/reflux, choking or coughing while eating, abdominal pain with or after eating No Shortness of Breath, palpitations, orthopnea or DOE No Abdominal pain,melena,hematochezia, dark tarry stools, constipation No dysuria, malodorous urine, hematuria or flank pain No new skin rashes, lesions, masses or bruises, No new joint pains, aches, swelling or redness No recent unintentional weight gain or loss No polyuria, polydypsia or polyphagia   Past Medical  History:  Diagnosis Date  . Alcoholism ([place])   . Anxiety   . Bipolar disorder ([place])   . Depression   . GERD (gastroesophageal reflux disease)   . High cholesterol   . History of kidney stones   . Hypertension   . Pancreatitis   . Pneumonia ~ 2012  . Type II diabetes mellitus ([place])     Past Surgical History:  Procedure Laterality Date  . CYSTOSCOPY W/ STONE  MANIPULATION  X 1   "there's still stones in there" (02/11/2017)  . LAPAROSCOPIC CHOLECYSTECTOMY      Social History   Socioeconomic History  . Marital status: Divorced    Spouse name: Not on file  . Number of children: Not on file  . Years of education: Not on file  . Highest education level: Not on file  Social Needs  . Financial resource strain: Not on file  . Food insecurity - worry: Not on file  . Food insecurity - inability: Not on file  . Transportation needs - medical: Not on file  . Transportation needs - non-medical: Not on file  Occupational History  . Not on file  Tobacco Use  . Smoking status: Current Some Day Smoker    Packs/day: 0.12    Years: 7.00    Pack years: 0.84    Types: Cigarettes  . Smokeless tobacco: Never Used  Substance and Sexual Activity  . Alcohol use: Yes    Comment: 02/11/2017 "stopped September 11, 2016"  . Drug use: No  . Sexual activity: Not Currently  Other Topics Concern  . Not on file  Social History Narrative  . Not on file    Mobility: Independent Work history: Employed   Allergies  Allergen Reactions  . Metformin Other (See Comments)    Reported lactic acidosis by hospital record  . Morphine Itching  . Penicillins Itching    Has patient had a PCN reaction causing immediate rash, facial/tongue/throat swelling, SOB or lightheadedness with hypotension: No Has patient had a PCN reaction causing severe rash involving mucus membranes or skin necrosis: No Has patient had a PCN reaction that required hospitalization: No Has patient had a PCN reaction occurring within the last 10 years: Yes If all of the above answers are "NO", then may proceed with Cephalosporin use.  Marland Kitchen Amoxicillin Rash  . Meperidine Itching    Family History  Problem Relation Age of Onset  . Kidney Stones Father      Prior to Admission medications   Medication Sig Start Date End Date Taking? Authorizing Provider  ARIPiprazole (ABILIFY) 5 MG tablet Take 5  mg by mouth daily.    [provider]  atorvastatin (LIPITOR) 10 MG tablet Take 10 mg daily by mouth. 08/17/16   [provider]  BAYER MICROLET LANCETS lancets 1 each by Other route 4 (four) times daily. Puncture skin 01/21/17   Dessa Phi, DO  blood glucose meter kit and supplies Dispense based on patient and insurance preference. Use up to four times daily as directed. (FOR ICD-9 250.00, 250.01). 01/21/17   Dessa Phi, DO  carvedilol (COREG) 12.5 MG tablet Take 12.5 mg by mouth 2 (two) times daily. 07/11/15   [provider]  clonazePAM (KLONOPIN) 1 MG tablet Take 1 mg 2 (two) times daily as needed by mouth. 12/30/15   [provider]  Continuous Blood Gluc Sensor MISC 1 each as directed by Does not apply route. Use as directed every 10 days. May dispense FreeStyle [employer]  or similar. 02/04/17   Betancourt, Aura Fey, NP  DULoxetine (CYMBALTA) 30 MG capsule 60 mg(2 tabs) in morning, 30 mg(1 tab) in the evening 02/14/17   Oretha Milch D, MD  escitalopram (LEXAPRO) 20 MG tablet Take 1 tablet by mouth daily. 02/23/17   [provider]  fenofibrate 160 MG tablet Take 160 mg by mouth. 06/04/16   [provider]  fluticasone (FLONASE) 50 MCG/ACT nasal spray Place 1 spray into both nostrils 2 (two) times daily. 02/16/17 04/17/17  Betancourt, Aura Fey, NP  gabapentin (NEURONTIN) 600 MG tablet Take 1 tablet (600 mg total) 3 (three) times daily by mouth. 02/14/17   Oretha Milch D, MD  glucose blood (BAYER CONTOUR TEST) test strip 1 each by Other route 4 (four) times daily. 01/21/17   Dessa Phi, DO  hydrOXYzine (VISTARIL) 50 MG capsule Take 50 mg 2 (two) times daily as needed by mouth. 07/11/15   [provider]  Insulin Glargine (LANTUS SOLOSTAR) 100 UNIT/ML Solostar Pen Inject 22 Units into the skin daily. 01/21/17   Dessa Phi, DO  insulin lispro (HUMALOG) 100 UNIT/ML injection Inject 0-0.1 mLs (0-10 Units total) into the  skin 3 (three) times daily before meals. Per sliding scale Patient taking differently: Inject 24 Units 2 (two) times daily at 10 AM and 5 PM into the skin. Per sliding scale  01/21/17   Dessa Phi, DO  Insulin Pen Needle 31G X 5 MM MISC Use daily with lantus solostar pen 01/21/17   Dessa Phi, DO  Insulin Syringe-Needle U-100 30G X 1/2" 0.3 ML MISC 1 Syringe by Does not apply route 3 (three) times daily as needed (with meals). 01/21/17   Dessa Phi, DO  lamoTRIgine (LAMICTAL) 25 MG tablet Take 25 mg by mouth daily. 10/23/16   [provider]  loperamide (IMODIUM) 2 MG capsule Take 1 capsule (2 mg total) by mouth as needed for diarrhea or loose stools. 01/21/17   Dessa Phi, DO  methocarbamol (ROBAXIN) 500 MG tablet Take 500 mg by mouth 2 (two) times daily as needed. 11/09/16   [provider]  nabumetone (RELAFEN) 500 MG tablet Take 500 mg by mouth 2 (two) times daily.    [provider]  naltrexone (DEPADE) 50 MG tablet Take 1 tablet (50 mg total) daily by mouth. 02/14/17   Oretha Milch D, MD  potassium chloride SA (K-DUR,KLOR-CON) 20 MEQ tablet Take 2 tablets (40 mEq total) daily by mouth. 02/14/17   Oretha Milch D, MD  sodium chloride (OCEAN) 0.65 % SOLN nasal spray Place 2 sprays into both nostrils every 2 (two) hours while awake. 02/16/17 03/18/17  Betancourt, Aura Fey, NP  traZODone (DESYREL) 50 MG tablet Take 1 tablet by mouth daily as needed. 11/23/16   [provider]    Physical Exam: Vitals:   03/02/17 0810 03/02/17 0815 03/02/17 0830 03/02/17 0900  BP: 112/74 112/71 115/78 124/79  Pulse: 83 79 79 86  Resp: _0 Temp:      TempSrc:      SpO2: 98% 98% 99% 99%  Weight:      Height:          Constitutional: NAD, calm, comfortable-appears underweight Eyes: PERRL, lids and conjunctivae normal ENMT: Mucous membranes are dry. Posterior pharynx clear of any exudate or lesions.Normal dentition.  Neck: normal, supple, no  masses, no thyromegaly Respiratory: clear to auscultation bilaterally, no wheezing, no crackles. Normal respiratory effort. No accessory muscle use.  Cardiovascular: Regular rate and rhythm, no  murmurs / rubs / gallops. No extremity edema. 2+ pedal pulses. No carotid bruits.  Abdomen: no tenderness, no masses palpated. No hepatosplenomegaly. Bowel sounds positive.  Musculoskeletal: no clubbing / cyanosis. No joint deformity upper and lower extremities. Good ROM, no contractures. Normal muscle tone.  Skin: no rashes, lesions, ulcers. No induration Neurologic: CN 2-12 grossly intact. Sensation intact, DTR normal. Strength 5/5 x all 4 extremities.  Psychiatric: Poor judgment and insight. Alert and oriented x 3.  Severely flat affect   Labs on Admission: I have personally reviewed following labs and imaging studies  CBC: Recent Labs  Lab 03/01/17 2303  WBC 8.3  HGB 14.1  HCT 39.2  MCV 92.5  PLT 562*   Basic Metabolic Panel: Recent Labs  Lab 02/23/17 1010 03/01/17 2303  NA 129* 131*  K 4.9 4.0  CL 77* 74*  CO2 24 28  GLUCOSE 793* 593*  BUN 9 11  CREATININE 0.77 0.90  CALCIUM 10.0 11.0*  MG 1.7  --    GFR: Estimated Creatinine Clearance: 83.8 mL/min (by C-G formula based on SCr of 0.9 mg/dL). Liver Function Tests: No results for input(s): AST, ALT, ALKPHOS, BILITOT, PROT, ALBUMIN in the last 168 hours. No results for input(s): LIPASE, AMYLASE in the last 168 hours. No results for input(s): AMMONIA in the last 168 hours. Coagulation Profile: No results for input(s): INR, PROTIME in the last 168 hours. Cardiac Enzymes: No results for input(s): CKTOTAL, CKMB, CKMBINDEX, TROPONINI in the last 168 hours. BNP (last 3 results) No results for input(s): PROBNP in the last 8760 hours. HbA1C: No results for input(s): HGBA1C in the last 72 hours. CBG: Recent Labs  Lab 03/02/17 0733 03/02/17 0937  GLUCAP 489* 467*   Lipid Profile: No results for input(s): CHOL, HDL, LDLCALC,  TRIG, CHOLHDL, LDLDIRECT in the last 72 hours. Thyroid Function Tests: No results for input(s): TSH, T4TOTAL, FREET4, T3FREE, THYROIDAB in the last 72 hours. Anemia Panel: No results for input(s): VITAMINB12, FOLATE, FERRITIN, TIBC, IRON, RETICCTPCT in the last 72 hours. Urine analysis:    Component Value Date/Time   COLORURINE STRAW (A) 02/11/2017 1128   APPEARANCEUR CLEAR 02/11/2017 1128   LABSPEC 1.023 02/11/2017 1128   PHURINE 6.0 02/11/2017 1128   GLUCOSEU >=500 (A) 02/11/2017 1128   HGBUR MODERATE (A) 02/11/2017 1128   BILIRUBINUR NEGATIVE 02/11/2017 1128   KETONESUR 5 (A) 02/11/2017 1128   PROTEINUR NEGATIVE 02/11/2017 1128   UROBILINOGEN 0.2 05/31/2009 1242   NITRITE NEGATIVE 02/11/2017 1128   LEUKOCYTESUR NEGATIVE 02/11/2017 1128   Sepsis Labs: _0 (procalcitonin:4,lacticidven:4) )No results found for this or any previous visit (from the past 240 hour(s)).   Radiological Exams on Admission: Dg Chest 2 View  Result Date: 03/02/2017 CLINICAL DATA:  46 year old male with mid chest pain and shortness of breath. EXAM: CHEST  2 VIEW COMPARISON:  Chest radiograph dated 01/19/2017 FINDINGS: The lungs are clear. There is no pleural effusion or pneumothorax. The cardiac silhouette is within normal limits. No acute osseous pathology. IMPRESSION: No active cardiopulmonary disease. Electronically Signed   By: Anner Crete M.D.   On: 03/02/2017 00:16    EKG: (Independently reviewed) sinus rhythm with ventricular rate 93 bpm, QTC 452 ms, normal R wave rotation, no ischemic changes  Assessment/Plan Principal Problem:   DKA (diabetic ketoacidoses)/Nonadherence to medication -Presents to the ER with chest pain and vomiting likely secondary to diabetic gastroparesis in the context of recurrent DKA -Patient freely admits to not self administering insulin and only takes insulin regularly when administered  by workplace nurse to his reluctance to utilize needles -Workplace NP has  made multiple adjustments in patient's diabetic regimen including insulins, assistance with administration of insulins, and change in glucometer so patient would not have to perform fingersticks but he repeatedly has remained noncompliant.  Recently he told workplace NP he did not replace his freestyle libre monitoring device because he could not reach the back of his arm yet he told me he was unable to obtain the prescription prompting me to ask case management to ensure her insurance would pay for this product. -For ease of administration of insulin and insulin pump would be best for this patient but given his poor history with compliance he may not be the best candidate for this type of insulin device.  I have asked diabetes educator to assist. -For now begin insulin infusion to correct DKA -Continue aggressive volume resuscitation -Follow BMET q 4 hrs until AG closed -Noncaloric clear liquids -Resume preadmission Lantus once AG closed for at least 2 hours+ -It is clear that patient's underlying psychiatric condition and persistent severe depression is influencing his ability to manage his diabetes; I also suspect ongoing occult alcohol abuse contributing as well (see below) -In review of encounters, patient has either presented to the ED and/or been admitted for diabetic related issues once a month since September with his most recent discharge on 11/18 and returns today about 2 weeks later.  **1640: AG 12, CO2 32-continue insulin infusion and begin home dose Lantus 22 units twice daily.  In context of acute/recent DKA will hold home Jardiance (therapeutic substitution would be Invokana).  Follow CBGs before meals/at bedtime with medium SS I.  Continue NS at 100/hr--- change bed status from stepdown to MedSurg  Active Problems:   Bipolar disorder  -Patient is on a multitude of medications without any improvement in symptoms and self medicates with alcohol-containing products (NyQuil) -Patient does  have a private psychiatrist and apparently is in the process of obtaining personal leave from work for at least 2 weeks to attend mental health appointments during the holiday season but it is unclear if he is pursued this option -It is quite clear that patient does not have the appropriate insight into how his actions are influencing his overall health and does not take ownership of his health care needs -Continue preadmission Abilify, Klonopin, Cymbalta, Lamictal, and prn Vistaril -Does not have any SI/HI but clearly would benefit from voluntary inpatient psychiatric treatment-may need to contact patient's private psychiatrist Dr. Buford Dresser for input    Hypertension -Blood pressure currently suboptimal in context of significant dehydration and DKA -Hold preadmission carvedilol    Alcohol abuse -Patient has informed workplace NP as well hospitalist team in the past that he stopped alcohol but is very vague as to when his last drink was -When questioned, he reports he utilizes NyQuil regularly/daily at "2-3 times a dose on supposed to take" and this is likely to replace the alcohol he states he is not drinking -Alcohol level and UDS -According to workplace NP note as of 11/29 patient had not returned the alcohol rehab paperwork to the clinic -Continue preadmission naltrexone    Peripheral neuropathy -Continue preadmission pain medications: Robaxin, Relafen -Avoid utilization of opiates or opioid-like products given concomitant administration of naltrexone as above    GERD (gastroesophageal reflux disease) -IV PPI    High cholesterol -Hold preadmission statin until diet advanced    Thrombocytopenia  -Chronic ongoing problem and likely related to ongoing substance abuse and poorly  controlled diabetes -Also utilizes NSAIDs regularly      DVT prophylaxis: Lovenox Code Status: Full Family Communication: No family at bedside Disposition Plan: Home Consults called:  None    Terry Mikami L. ANP-BC [facility] Pager [phone]   If 7PM-7AM, please contact night-coverage [email or web address] Password [email or web address]  03/02/2017, 9:41 AM

## 2017-03-02 NOTE — Progress Notes (Signed)
Patient arrived to unit. PT is alert and oriented X4, denies pain. Per EMT that transported patient, "PT is enteric". Patient is placed on enteric precaution to review chart.

## 2017-03-02 NOTE — ED Notes (Signed)
Notified provider regarding patient continued pain in lower extremities. Stated to start giving home medication ordered.

## 2017-03-02 NOTE — ED Notes (Signed)
Notified greg RN

## 2017-03-03 ENCOUNTER — Other Ambulatory Visit: Payer: Self-pay

## 2017-03-03 ENCOUNTER — Encounter (HOSPITAL_COMMUNITY): Payer: Self-pay | Admitting: General Practice

## 2017-03-03 DIAGNOSIS — F1010 Alcohol abuse, uncomplicated: Secondary | ICD-10-CM

## 2017-03-03 DIAGNOSIS — I10 Essential (primary) hypertension: Secondary | ICD-10-CM

## 2017-03-03 DIAGNOSIS — E7800 Pure hypercholesterolemia, unspecified: Secondary | ICD-10-CM

## 2017-03-03 DIAGNOSIS — G8929 Other chronic pain: Secondary | ICD-10-CM

## 2017-03-03 DIAGNOSIS — E1310 Other specified diabetes mellitus with ketoacidosis without coma: Secondary | ICD-10-CM

## 2017-03-03 DIAGNOSIS — K219 Gastro-esophageal reflux disease without esophagitis: Secondary | ICD-10-CM

## 2017-03-03 DIAGNOSIS — M79604 Pain in right leg: Secondary | ICD-10-CM

## 2017-03-03 DIAGNOSIS — M79605 Pain in left leg: Secondary | ICD-10-CM

## 2017-03-03 DIAGNOSIS — F319 Bipolar disorder, unspecified: Secondary | ICD-10-CM

## 2017-03-03 DIAGNOSIS — D696 Thrombocytopenia, unspecified: Secondary | ICD-10-CM

## 2017-03-03 DIAGNOSIS — E1110 Type 2 diabetes mellitus with ketoacidosis without coma: Principal | ICD-10-CM

## 2017-03-03 DIAGNOSIS — Z9114 Patient's other noncompliance with medication regimen: Secondary | ICD-10-CM

## 2017-03-03 LAB — BASIC METABOLIC PANEL
ANION GAP: 13 (ref 5–15)
ANION GAP: 12 (ref 5–15)
Anion gap: 14 (ref 5–15)
Anion gap: 15 (ref 5–15)
BUN: <5 mg/dL — ABNORMAL LOW (ref 6–20)
BUN: 5 mg/dL — AB (ref 6–20)
BUN: 6 mg/dL (ref 6–20)
CALCIUM: 7.9 mg/dL — AB (ref 8.9–10.3)
CALCIUM: 8.1 mg/dL — AB (ref 8.9–10.3)
CALCIUM: 7.8 mg/dL — AB (ref 8.9–10.3)
CHLORIDE: 90 mmol/L — AB (ref 101–111)
CHLORIDE: 93 mmol/L — AB (ref 101–111)
CO2: 28 mmol/L (ref 22–32)
CO2: 27 mmol/L (ref 22–32)
CO2: 26 mmol/L (ref 22–32)
CO2: 26 mmol/L (ref 22–32)
CREATININE: 0.48 mg/dL — AB (ref 0.61–1.24)
CREATININE: 0.59 mg/dL — AB (ref 0.61–1.24)
CREATININE: 0.53 mg/dL — AB (ref 0.61–1.24)
Calcium: 7.8 mg/dL — ABNORMAL LOW (ref 8.9–10.3)
Chloride: 91 mmol/L — ABNORMAL LOW (ref 101–111)
Chloride: 93 mmol/L — ABNORMAL LOW (ref 101–111)
Creatinine, Ser: 0.62 mg/dL (ref 0.61–1.24)
GFR calc Af Amer: >60 mL/min (ref >60)
GFR calc non Af Amer: >60 mL/min (ref >60)
GLUCOSE: 184 mg/dL — AB (ref 65–99)
GLUCOSE: 237 mg/dL — AB (ref 65–99)
Glucose, Bld: 126 mg/dL — ABNORMAL HIGH (ref 65–99)
Glucose, Bld: 100 mg/dL — ABNORMAL HIGH (ref 65–99)
POTASSIUM: 3.4 mmol/L — AB (ref 3.5–5.1)
Potassium: 2.8 mmol/L — ABNORMAL LOW (ref 3.5–5.1)
Potassium: 2.5 mmol/L — CL (ref 3.5–5.1)
Potassium: 3.2 mmol/L — ABNORMAL LOW (ref 3.5–5.1)
SODIUM: 132 mmol/L — AB (ref 135–145)
SODIUM: 133 mmol/L — AB (ref 135–145)
SODIUM: 132 mmol/L — AB (ref 135–145)
Sodium: 131 mmol/L — ABNORMAL LOW (ref 135–145)

## 2017-03-03 LAB — C DIFFICILE QUICK SCREEN W PCR REFLEX
C DIFFICILE (CDIFF) INTERP: NOT DETECTED
C DIFFICILE (CDIFF) TOXIN: NEGATIVE
C Diff antigen: NEGATIVE

## 2017-03-03 LAB — PHOSPHORUS: Phosphorus: 2.6 mg/dL (ref 2.5–4.6)

## 2017-03-03 LAB — CBC WITH DIFFERENTIAL/PLATELET
Basophils Absolute: 0 10*3/uL (ref 0.0–0.1)
Basophils Relative: 1 %
Eosinophils Absolute: 0 10*3/uL (ref 0.0–0.7)
Eosinophils Relative: 1 %
HEMATOCRIT: 33.2 % — AB (ref 39.0–52.0)
Hemoglobin: 12 g/dL — ABNORMAL LOW (ref 13.0–17.0)
LYMPHS ABS: 1 10*3/uL (ref 0.7–4.0)
LYMPHS PCT: 26 %
MCH: 33.1 pg (ref 26.0–34.0)
MCHC: 36.1 g/dL — AB (ref 30.0–36.0)
MCV: 91.7 fL (ref 78.0–100.0)
MONO ABS: 0.3 10*3/uL (ref 0.1–1.0)
MONOS PCT: 9 %
NEUTROS ABS: 2.5 10*3/uL (ref 1.7–7.7)
Neutrophils Relative %: 64 %
Platelets: 80 10*3/uL — ABNORMAL LOW (ref 150–400)
RBC: 3.62 MIL/uL — ABNORMAL LOW (ref 4.22–5.81)
RDW: 12.8 % (ref 11.5–15.5)
WBC: 3.9 10*3/uL — ABNORMAL LOW (ref 4.0–10.5)

## 2017-03-03 LAB — GLUCOSE, CAPILLARY
GLUCOSE-CAPILLARY: 172 mg/dL — AB (ref 65–99)
Glucose-Capillary: 111 mg/dL — ABNORMAL HIGH (ref 65–99)
Glucose-Capillary: 154 mg/dL — ABNORMAL HIGH (ref 65–99)

## 2017-03-03 LAB — MAGNESIUM: Magnesium: 1.6 mg/dL — ABNORMAL LOW (ref 1.7–2.4)

## 2017-03-03 MED ORDER — POTASSIUM CHLORIDE CRYS ER 20 MEQ PO TBCR
40.0000 meq | EXTENDED_RELEASE_TABLET | Freq: Once | ORAL | Status: AC
  Administered 2017-03-03: 40 meq via ORAL
  Filled 2017-03-03: qty 2

## 2017-03-03 MED ORDER — MAGNESIUM SULFATE 2 GM/50ML IV SOLN
2.0000 g | Freq: Once | INTRAVENOUS | Status: AC
  Administered 2017-03-03: 2 g via INTRAVENOUS
  Filled 2017-03-03: qty 50

## 2017-03-03 MED ORDER — POTASSIUM CHLORIDE 10 MEQ/100ML IV SOLN
10.0000 meq | Freq: Once | INTRAVENOUS | Status: AC
  Administered 2017-03-03: 10 meq via INTRAVENOUS
  Filled 2017-03-03: qty 100

## 2017-03-03 MED ORDER — ESCITALOPRAM OXALATE 10 MG PO TABS: 20.0000 mg | ORAL_TABLET | Freq: Every day | ORAL | Status: DC

## 2017-03-03 MED ORDER — POTASSIUM CHLORIDE 10 MEQ/100ML IV SOLN
10.0000 meq | INTRAVENOUS | Status: AC
  Administered 2017-03-03 (×2): 10 meq via INTRAVENOUS
  Filled 2017-03-03 (×2): qty 100

## 2017-03-03 MED ORDER — PANTOPRAZOLE SODIUM 40 MG PO TBEC
40.0000 mg | DELAYED_RELEASE_TABLET | Freq: Every day | ORAL | Status: DC
  Administered 2017-03-03 – 2017-03-05 (×3): 40 mg via ORAL
  Filled 2017-03-03 (×3): qty 1

## 2017-03-03 NOTE — Progress Notes (Signed)
CRITICAL VALUE ALERT  Critical Value:  Potassium 2.5  Date & Time Notied:  03/03/17 at 0545  Provider Notified: T. Opyd  Orders Received/Actions taken: pending

## 2017-03-03 NOTE — Care Management Note (Signed)
Case Management Note  Patient Details  Name: Terry Schultz MRN: [id number] Date of Birth: [birth year]  Subjective/Objective:   Pt admitted with DKA. He is from home alone. CM consulted for assistance with prior [facility].                  Action/Plan: CM spoke to the DM coordinator (see note from 12/4) regarding the [facility]. Per DM coordinator the patient no longer is part of their study and we are unable to assist him with equipment, supplies or prior authorization for his insurance.  Patient is to f/u with his PCP for his Freestyle needs. CM informed the patient. He states he has plenty of supplies to hold him until he sees his PCP.  CM continuing to follow.  Expected Discharge Date:                  Expected Discharge Plan:  Home/Self Care  In-House Referral:     Discharge planning Services  CM Consult, Other - See comment(preauth for diabetes equipment)  Post Acute Care Choice:    Choice offered to:     DME Arranged:    DME Agency:     HH Arranged:    HH Agency:     Status of Service:  In process, will continue to follow  If discussed at Long Length of Stay Meetings, dates discussed:    Additional Comments:  Kermit BaloKelli F Renia Mikelson, RN 03/03/2017, 1:26 PM

## 2017-03-03 NOTE — Progress Notes (Addendum)
New potassium 2.8 . Hospitalist paged.  40 mEq po and 10 mEq IV potassium ordered.

## 2017-03-03 NOTE — Progress Notes (Addendum)
PROGRESS NOTE    Terry Schultz  ZOX:[id number] DOB: November 30, 1970 DOA: 03/02/2017 PCP: Cheron Schaumann., MD   Brief Narrative:  Terry Schultz is a 46 y.o. male with medical history significant for diabetes mellitus 2 on insulin with frequent admissions for nonadherence to medical therapies, severe bipolar disorder, alcohol abuse, history of renal calculi.  Patient was last discharged on 11/18 after an admission for DKA secondary to nonadherence to administration of home insulins and was not checking sugars regularly.  Since that hospitalization he is regularly followed up with nurse practitioner at his job.  He has apparently been started on a freestyle libre glucose monitoring device which allows him to keep a specialized patch in place that allows the monitor to remotely detect CBGs without a fingerstick each occurrence.  Patient reports that during the weekdays the nurse at work administers his Lantus insulin but he is reluctant to give his injections due to reports of pain in fear of needles.  He admits to not taking his insulin for several days and states today is a day his libre patch runs out.  He was last evaluated by the work Publishing rights manager on 11/29.  According to her note patient had not applied a new libre patch because he states he could not reach the back of his arm to apply it.  He also admitted to utilizing NyQuil every night for reported cough.  She also discussed extensively with him his plans regarding keeping his psychiatric and AA appointments.  He apparently is in the process of applying for personally that work as well.  Note he has not returned the alcohol rehab paperwork to the clinic.  He presented to the ER with reports of chest discomfort and an episode of emesis. The chest discomfort was not related to exertion, he did not have any shortness of breath or diaphoresis.  His EKG was unremarkable for any acute ischemic changes and his initial troponin was normal.  His  initial glucose was 593 with an anion gap of 29.  EDP has ordered fluid boluses.  He is also been given an injection of Toradol for lower extremity pain. Admitted for DKA 2/2 to Medication Non-compliance.  Assessment & Plan:   Principal Problem:   DKA (diabetic ketoacidoses) (HCC) Active Problems:   Bipolar disorder (HCC)   GERD (gastroesophageal reflux disease)   High cholesterol   Hypertension   Alcohol abuse   Nonadherence to medication   Thrombocytopenia (HCC)  DKA (diabetic ketoacidoses) 2/2 Nonadherence to medication, improved -Presented to the ER with chest pain and vomiting likely secondary to diabetic gastroparesis in the context of recurrent DKA -Patient freely admits to not self administering insulin and only takes insulin regularly when administered by workplace nurse to his reluctance to utilize needles -Workplace NP has made multiple adjustments in patient's diabetic regimen including insulins, assistance with administration of insulins, and change in glucometer so patient would not have to perform fingersticks but he repeatedly has remained noncompliant.  Recently he told workplace NP he did not replace his freestyle libre monitoring device because he could not reach the back of his arm yet he told me he was unable to obtain the prescription prompting me to ask case management to ensure her insurance would pay for this product. -Diabetes Education Coordinator consulted. -C/w Lantus 22 units BID and Moderate Novolog SSI AC/HS -Continued aggressive volume resuscitation -BMET was cycled q4h; C/w IVF NS at 100 mL/hr -Noncaloric clear liquids -It is clear that patient's underlying psychiatric  condition and persistent severe depression is influencing his ability to manage his diabetes along with Alcohol Abuse.  Bipolar disorder  -Patient is on a multitude of medications without any improvement in symptoms and self medicates with alcohol-containing products (NyQuil) -Patient does  have a private psychiatrist and apparently is in the process of obtaining personal leave from work for at least 2 weeks to attend mental health appointments during the holiday season but it is unclear if he is pursued this option -Continue preadmission Abilify, Klonopin, Cymbalta, Lamictal, and prn Vistaril -Does not have any SI/HI but clearly would benefit from voluntary inpatient psychiatric treatment-may need to contact patient's private psychiatrist Dr. Juanetta BeetsJacqueline Norman in AM  Hypertension -Blood pressure currently suboptimal in context of significant dehydration and DKA -Held preadmission carvedilol  Alcohol abuse -Patient has informed workplace NP as well hospitalist team in the past that he stopped alcohol but is very vague as to when his last drink was -When questioned, he reports he utilizes NyQuil regularly/daily at "2-3 times a dose on supposed to take" and this is likely to replace the alcohol he states he is not drinking -Alcohol level  <10 and UDS Negative  -According to workplace NP note as of 11/29 patient had not returned the alcohol rehab paperwork to the clinic -Continue preadmission naltrexone  Peripheral neuropathy -Continue preadmission pain medications: Robaxin, Relafen -Avoid utilization of opiates or opioid-like products given concomitant administration of naltrexone as above  GERD (gastroesophageal reflux disease) -IV PPI can be changed to po   HLD -Hold preadmission statin until diet advanced  Thrombocytopenia  -Chronic ongoing problem and likely related to ongoing substance abuse and poorly controlled diabetes -Platelet Count went from 130 -> 80 -Continue to Monitor and Repeat CBC in AM    Diarrhea -C Difficile Negative -GI Pathogen Panel Pending -C/w IVF Rehydration  Hypomagnesemia -Mag Level was 1.6 -Replete -Repeat Mag Level in AM  Hypokalemia -Likely from Diarrhea -Replete -Repeat CMP in AM   DVT prophylaxis: Enoxaparin 40 mg sq  q24h Code Status: FULL CODE Family Communication: No family present at bedside Disposition Plan: Anticipate Discharge within the next 24-48 hours if medically stable   Consultants:   Diabetes Education Coordinator   Procedures:  None   Antimicrobials:  Anti-infectives (From admission, onward)   None     Subjective: Seen and examined and main complaint was diarrhea. No CP or SOB. Denied any other concerns or complaints at this time.   Objective: Vitals:   03/02/17 1839 03/02/17 2149 03/03/17 0149 03/03/17 0820  BP: 134/84 125/79 131/88 120/89  Pulse: 69 62  69  Resp:  14 14 16   Temp: 99.2 F (37.3 C) 98.5 F (36.9 C) 98.1 F (36.7 C) 98.2 F (36.8 C)  TempSrc: Oral Oral Oral Oral  SpO2: 99% 100% 100% 100%  Weight:      Height:        Intake/Output Summary (Last 24 hours) at 03/03/2017 0830 Last data filed at 03/03/2017 0400 Gross per 24 hour  Intake 4799.08 ml  Output 600 ml  Net 4199.08 ml   Filed Weights   03/01/17 2255  Weight: 59 kg (130 lb)   Examination: Physical Exam:  Constitutional: WN/WD Caucasian male in NAD and appears calm and comfortable Eyes: Lids and conjunctivae normal, sclerae anicteric  ENMT: External Ears, Nose appear normal. Grossly normal hearing. Mucous membranes are moist.  Neck: Appears normal, supple, no cervical masses, normal ROM, no appreciable thyromegaly, no JVD Respiratory: Clear to auscultation bilaterally, no wheezing,  rales, rhonchi or crackles. Normal respiratory effort and patient is not tachypenic. No accessory muscle use.  Cardiovascular: RRR, no murmurs / rubs / gallops. S1 and S2 auscultated. No extremity edema.  Abdomen: Soft, mildly tender, non-distended. No masses palpated. No appreciable hepatosplenomegaly. Bowel sounds positive x4.  GU: Deferred. Musculoskeletal: No clubbing / cyanosis of digits/nails. No joint deformity upper and lower extremities.  Skin: No rashes, lesions, ulcers on a limited skin eval. No  induration; Warm and dry.  Neurologic: CN 2-12 grossly intact with no focal deficits. Romberg sign and cerebellar reflexes not assessed.  Psychiatric: Normal judgment and insight. Alert and awake. Depressed appearing mood and appropriate affect.   Data Reviewed: I have personally reviewed following labs and imaging studies  CBC: Recent Labs  Lab 03/01/17 2303  WBC 8.3  HGB 14.1  HCT 39.2  MCV 92.5  PLT 130*   Basic Metabolic Panel: Recent Labs  Lab 03/02/17 0905 03/02/17 0921 03/02/17 1550 03/02/17 2015 03/03/17 0010 03/03/17 0440  NA  --  132* 134* 133* 133* 132*  K  --  4.0 3.5 3.4* 2.8* 2.5*  CL  --  88* 90* 89* 91* 90*  CO2  --  27 32 33* 28 27  GLUCOSE  --  321* 145* 144* 126* 100*  BUN  --  5* <5* 6 6 5*  CREATININE  --  0.84 0.75 0.72 0.59* 0.48*  CALCIUM  --  8.1* 8.3* 8.2* 8.1* 7.9*  MG 1.2*  --   --   --   --   --   PHOS 3.3  --   --   --   --   --    GFR: Estimated Creatinine Clearance: 94.2 mL/min (A) (by C-G formula based on SCr of 0.48 mg/dL (L)). Liver Function Tests: No results for input(s): AST, ALT, ALKPHOS, BILITOT, PROT, ALBUMIN in the last 168 hours. No results for input(s): LIPASE, AMYLASE in the last 168 hours. No results for input(s): AMMONIA in the last 168 hours. Coagulation Profile: No results for input(s): INR, PROTIME in the last 168 hours. Cardiac Enzymes: No results for input(s): CKTOTAL, CKMB, CKMBINDEX, TROPONINI in the last 168 hours. BNP (last 3 results) No results for input(s): PROBNP in the last 8760 hours. HbA1C: No results for input(s): HGBA1C in the last 72 hours. CBG: Recent Labs  Lab 03/02/17 1449 03/02/17 1551 03/02/17 1739 03/02/17 2201 03/03/17 0639  GLUCAP 232* 167* 125* 141* 111*   Lipid Profile: No results for input(s): CHOL, HDL, LDLCALC, TRIG, CHOLHDL, LDLDIRECT in the last 72 hours. Thyroid Function Tests: No results for input(s): TSH, T4TOTAL, FREET4, T3FREE, THYROIDAB in the last 72 hours. Anemia  Panel: No results for input(s): VITAMINB12, FOLATE, FERRITIN, TIBC, IRON, RETICCTPCT in the last 72 hours. Sepsis Labs: No results for input(s): PROCALCITON, LATICACIDVEN in the last 168 hours.  Recent Results (from the past 240 hour(s))  C difficile quick scan w PCR reflex     Status: None   Collection Time: 03/02/17  8:06 PM  Result Value Ref Range Status   C Diff antigen NEGATIVE NEGATIVE Final   C Diff toxin NEGATIVE NEGATIVE Final   C Diff interpretation No C. difficile detected.  Final    Radiology Studies: Dg Chest 2 View  Result Date: 03/02/2017 CLINICAL DATA:  46 year old male with mid chest pain and shortness of breath. EXAM: CHEST  2 VIEW COMPARISON:  Chest radiograph dated 01/19/2017 FINDINGS: The lungs are clear. There is no pleural effusion or pneumothorax. The  cardiac silhouette is within normal limits. No acute osseous pathology. IMPRESSION: No active cardiopulmonary disease. Electronically Signed   By: Elgie CollardArash  Radparvar M.D.   On: 03/02/2017 00:16   Scheduled Meds: . ARIPiprazole  5 mg Oral Daily  . DULoxetine  60 mg Oral Daily   And  . DULoxetine  30 mg Oral QHS  . enoxaparin (LOVENOX) injection  40 mg Subcutaneous Q24H  . folic acid  1 mg Oral Daily  . gabapentin  600 mg Oral TID  . insulin aspart  0-15 Units Subcutaneous TID WC  . insulin aspart  0-5 Units Subcutaneous QHS  . insulin glargine  22 Units Subcutaneous BID  . lamoTRIgine  25 mg Oral Daily  . multivitamin with minerals  1 tablet Oral Daily  . nabumetone  500 mg Oral BID  . naltrexone  50 mg Oral Daily  . pantoprazole  40 mg Oral Daily  . potassium chloride  40 mEq Oral Once  . thiamine  100 mg Oral Daily   Continuous Infusions: . sodium chloride 100 mL/hr at 03/03/17 0656  . potassium chloride 10 mEq (03/03/17 0819)    LOS: 1 day   Merlene Laughtermair Latif Terry Polimeni, DO [facility] Pager [phone]  If 7PM-7AM, please contact night-coverage [email or web address] Password [email or web address] 03/03/2017, 8:30 AM

## 2017-03-03 NOTE — Progress Notes (Signed)
Inpatient Diabetes Program Recommendations  AACE/ADA: New Consensus Statement on Inpatient Glycemic Control (2015)  Target Ranges:  Prepandial:   less than 140 mg/dL      Peak postprandial:   less than 180 mg/dL (1-2 hours)      Critically ill patients:  140 - 180 mg/dL   Lab Results  Component Value Date   GLUCAP 111 (H) 03/03/2017   HGBA1C 12.6 (H) 12/25/2016    Review of Glycemic ControlResults for Terry NeedyFISHER, Terry F (MRN [id number]) as of 03/03/2017 14:42  Ref. Range 03/02/2017 14:49 03/02/2017 15:51 03/02/2017 17:39 03/02/2017 22:01 03/03/2017 06:39  Glucose-Capillary Latest Ref Range: 65 - 99 mg/dL 213232 (H) 086167 (H) 578125 (H) 141 (H) 111 (H)   Diabetes history: DM Outpatient Diabetes medications: Lantus 22 units bid, Humalog 12 units tid with meals Current orders for Inpatient glycemic control:  Novolog moderate tid with meals and HS, Lantus 22 units bid  Inpatient Diabetes Program Recommendations:    Spoke with patient regarding readmission for DM.  He states that blood sugars are usually "high". He does not take insulin regularly stating "I don't like giving insulin in my stomach and I can't reach the back of my arms".  We discussed that insulin is absorbed best from the abdomen.  We also discussed that insulin is necessary every day to prevent DKA.  He must be consistent with insulin doses in order for blood sugars to be controlled and prevention of DKA.  Patient reports difficulty doing "anything" due to feeling overwhelmed and down.  May benefit from insulin delivery device called V-Go which would allow patient to apply daily and then it delivers both basal and meal coverage doses. Patient struggling to provide basic diabetes care including insulin injections at this point? May need home health after hospitalization to promote self care.  He needs to give all insulin injections to promote self-care and confidence.   Thanks, Beryl MeagerJenny Tynetta Bachmann, RN, BC-ADM Inpatient Diabetes Coordinator Pager  [phone] (8a-5p)

## 2017-03-04 LAB — CBC WITH DIFFERENTIAL/PLATELET
Basophils Absolute: 0 10*3/uL (ref 0.0–0.1)
Basophils Relative: 0 %
Eosinophils Absolute: 0 10*3/uL (ref 0.0–0.7)
Eosinophils Relative: 1 %
HEMATOCRIT: 35.2 % — AB (ref 39.0–52.0)
HEMOGLOBIN: 12.4 g/dL — AB (ref 13.0–17.0)
LYMPHS ABS: 1 10*3/uL (ref 0.7–4.0)
LYMPHS PCT: 31 %
MCH: 32.5 pg (ref 26.0–34.0)
MCHC: 35.2 g/dL (ref 30.0–36.0)
MCV: 92.1 fL (ref 78.0–100.0)
MONOS PCT: 13 %
Monocytes Absolute: 0.4 10*3/uL (ref 0.1–1.0)
NEUTROS PCT: 55 %
Neutro Abs: 1.8 10*3/uL (ref 1.7–7.7)
Platelets: 75 10*3/uL — ABNORMAL LOW (ref 150–400)
RBC: 3.82 MIL/uL — AB (ref 4.22–5.81)
RDW: 12.8 % (ref 11.5–15.5)
WBC: 3.3 10*3/uL — AB (ref 4.0–10.5)

## 2017-03-04 LAB — COMPREHENSIVE METABOLIC PANEL
ALK PHOS: 98 U/L (ref 38–126)
ALT: 39 U/L (ref 17–63)
ANION GAP: 10 (ref 5–15)
AST: 60 U/L — ABNORMAL HIGH (ref 15–41)
Albumin: 3.4 g/dL — ABNORMAL LOW (ref 3.5–5.0)
BILIRUBIN TOTAL: 1 mg/dL (ref 0.3–1.2)
BUN: <5 mg/dL — ABNORMAL LOW (ref 6–20)
CALCIUM: 8.5 mg/dL — AB (ref 8.9–10.3)
CO2: 28 mmol/L (ref 22–32)
CREATININE: 0.38 mg/dL — AB (ref 0.61–1.24)
Chloride: 97 mmol/L — ABNORMAL LOW (ref 101–111)
Glucose, Bld: 102 mg/dL — ABNORMAL HIGH (ref 65–99)
Potassium: 2.8 mmol/L — ABNORMAL LOW (ref 3.5–5.1)
Sodium: 135 mmol/L (ref 135–145)
TOTAL PROTEIN: 5.5 g/dL — AB (ref 6.5–8.1)

## 2017-03-04 LAB — GASTROINTESTINAL PANEL BY PCR, STOOL (REPLACES STOOL CULTURE)

## 2017-03-04 LAB — MAGNESIUM: MAGNESIUM: 2 mg/dL (ref 1.7–2.4)

## 2017-03-04 LAB — GLUCOSE, CAPILLARY
GLUCOSE-CAPILLARY: 275 mg/dL — AB (ref 65–99)
Glucose-Capillary: 83 mg/dL (ref 65–99)
Glucose-Capillary: 152 mg/dL — ABNORMAL HIGH (ref 65–99)
Glucose-Capillary: 91 mg/dL (ref 65–99)

## 2017-03-04 LAB — PHOSPHORUS: PHOSPHORUS: 2.6 mg/dL (ref 2.5–4.6)

## 2017-03-04 MED ORDER — LOPERAMIDE HCL 2 MG PO CAPS
2.0000 mg | ORAL_CAPSULE | ORAL | Status: DC | PRN
  Administered 2017-03-04: 2 mg via ORAL
  Filled 2017-03-04: qty 1

## 2017-03-04 MED ORDER — POTASSIUM CHLORIDE CRYS ER 20 MEQ PO TBCR
40.0000 meq | EXTENDED_RELEASE_TABLET | Freq: Two times a day (BID) | ORAL | Status: DC
  Administered 2017-03-04 – 2017-03-05 (×3): 40 meq via ORAL
  Filled 2017-03-04 (×3): qty 2

## 2017-03-04 MED ORDER — POTASSIUM CHLORIDE 10 MEQ/100ML IV SOLN
10.0000 meq | INTRAVENOUS | Status: AC
  Administered 2017-03-04 (×4): 10 meq via INTRAVENOUS
  Filled 2017-03-04 (×4): qty 100

## 2017-03-04 NOTE — Telephone Encounter (Signed)
[facility] still inpatient [facility].

## 2017-03-04 NOTE — Plan of Care (Signed)
  Progressing Education: Knowledge of General Education information will improve 03/04/2017 0626 - Progressing by Ashley RoyaltyEdoh, Mirely Pangle I, RN Health Behavior/Discharge Planning: Ability to manage health-related needs will improve 03/04/2017 0626 - Progressing by Azzie RoupEdoh, Hall Birchard I, RN Clinical Measurements: Ability to maintain clinical measurements within normal limits will improve 03/04/2017 0626 - Progressing by Ashley RoyaltyEdoh, Rozann Holts I, RN Diagnostic test results will improve 03/04/2017 0626 - Progressing by Ashley RoyaltyEdoh, Airyn Ellzey I, RN Respiratory complications will improve 03/04/2017 0626 - Progressing by Azzie RoupEdoh, Dede Dobesh I, RN Nutrition: Adequate nutrition will be maintained 03/04/2017 0626 - Progressing by Azzie RoupEdoh, Alan Drummer I, RN Coping: Level of anxiety will decrease 03/04/2017 0626 - Progressing by Azzie RoupEdoh, Julian Medina I, RN Elimination: Will not experience complications related to bowel motility 03/04/2017 0626 - Progressing by Ashley RoyaltyEdoh, Shakirah Kirkey I, RN

## 2017-03-04 NOTE — Progress Notes (Signed)
PROGRESS NOTE    Terry Schultz  WUJ:[id number] DOB: [birth year] DOA: 03/02/2017 PCP: Cheron Schaumann., MD   Brief Narrative:  Terry Schultz is a 46 y.o. male with medical history significant for diabetes mellitus 2 on insulin with frequent admissions for nonadherence to medical therapies, severe bipolar disorder, alcohol abuse, history of renal calculi.  Patient was last discharged on 11/18 after an admission for DKA secondary to nonadherence to administration of home insulins and was not checking sugars regularly.  Since that hospitalization he is regularly followed up with nurse practitioner at his job.  He has apparently been started on a freestyle libre glucose monitoring device which allows him to keep a specialized patch in place that allows the monitor to remotely detect CBGs without a fingerstick each occurrence.  Patient reports that during the weekdays the nurse at work administers his Lantus insulin but he is reluctant to give his injections due to reports of pain in fear of needles.  He admits to not taking his insulin for several days and states today is a day his libre patch runs out.  He was last evaluated by the work Publishing rights manager on 11/29.  According to her note patient had not applied a new libre patch because he states he could not reach the back of his arm to apply it.  He also admitted to utilizing NyQuil every night for reported cough.  She also discussed extensively with him his plans regarding keeping his psychiatric and AA appointments.  He apparently is in the process of applying for personally that work as well.  Note he has not returned the alcohol rehab paperwork to the clinic.  He presented to the ER with reports of chest discomfort and an episode of emesis. The chest discomfort was not related to exertion, he did not have any shortness of breath or diaphoresis.  His EKG was unremarkable for any acute ischemic changes and his initial troponin was normal.  His  initial glucose was 593 with an anion gap of 29.  EDP has ordered fluid boluses.  He is also been given an injection of Toradol for lower extremity pain. Admitted for DKA 2/2 to Medication Non-compliance.  Assessment & Plan:   Principal Problem:   DKA (diabetic ketoacidoses) (HCC) Active Problems:   Bipolar disorder (HCC)   GERD (gastroesophageal reflux disease)   High cholesterol   Hypertension   Alcohol abuse   Nonadherence to medication   Thrombocytopenia (HCC)  DKA (diabetic ketoacidoses) 2/2 Nonadherence to medication, improved -Presented to the ER with chest pain and vomiting likely secondary to diabetic gastroparesis in the context of recurrent DKA -Patient freely admits to not self administering insulin and only takes insulin regularly when administered by workplace nurse to his reluctance to utilize needles -Workplace NP has made multiple adjustments in patient's diabetic regimen including insulins, assistance with administration of insulins, and change in glucometer so patient would not have to perform fingersticks but he repeatedly has remained noncompliant.  Recently he told workplace NP he did not replace his freestyle libre monitoring device because he could not reach the back of his arm yet he told me he was unable to obtain the prescription prompting me to ask case management to ensure her insurance would pay for this product. -Diabetes Education Coordinator consulted. -C/w Lantus 22 units BID and Moderate Novolog SSI AC/HS -Continued aggressive volume resuscitation -BMET was cycled q4h; D/C IVF NS at 100 mL/hr -C/w Carb Modified Diet  -CBG's ranging from 83-152 -It  is clear that patient's underlying psychiatric condition and persistent severe depression is influencing his ability to manage his diabetes along with Alcohol Abuse.  Bipolar Disorder  -Patient is on a multitude of medications without any improvement in symptoms and self medicates with alcohol-containing  products (NyQuil) -Patient does have a private psychiatrist and apparently is in the process of obtaining personal leave from work for at least 2 weeks to attend mental health appointments during the holiday season but it is unclear if he is pursued this option -Continue preadmission Abilify, Klonopin, Cymbalta, Lamictal, and prn Vistaril -Does not have any SI/HI but clearly would benefit from voluntary inpatient psychiatric treatment-may need to contact patient's private psychiatrist Dr. Juanetta BeetsJacqueline Norman in AM  Hypertension -Blood pressure currently suboptimal in context of significant dehydration and DKA -Held preadmission carvedilol  Alcohol abuse -Patient has informed workplace NP as well hospitalist team in the past that he stopped alcohol but is very vague as to when his last drink was -When questioned, he reports he utilizes NyQuil regularly/daily at "2-3 times a dose on supposed to take" and this is likely to replace the alcohol he states he is not drinking -Alcohol level  <10 and UDS Negative  -According to workplace NP note as of 11/29 patient had not returned the alcohol rehab paperwork to the clinic -Continue preadmission Naltrexone  Peripheral neuropathy -Continue preadmission pain medications: Robaxin, Relafen -Avoid utilization of opiates or opioid-like products given concomitant administration of naltrexone as above  GERD (Gastroesophageal Reflux Disease) -C/w po Pantoprazole 40 mg Daily  HLD -Review of MAR shows patient not on Statin; will need to call Pharmacy  Thrombocytopenia  -Chronic ongoing problem and likely related to ongoing substance abuse and poorly controlled diabetes -Platelet Count went from 130 -> 80 -> 75 -Continue to Monitor and Repeat CBC in AM    Diarrhea -C Difficile Negative -GI Pathogen Panel Negative -Started Imodium for Diarrhea -D/C'd IVF Rehydration  Hypomagnesemia -Mag Level was 1.6 and improved to 2.0 -Replete -Repeat Mag  Level in AM  Hypokalemia -K+ went from 3.4 -> 2.8 -Likely from Diarrhea -Replete with IV KCl 40 mEQ and po KCl 40 mEQ BID -Repeat CMP in AM   DVT prophylaxis: Enoxaparin 40 mg sq q24h Code Status: FULL CODE Family Communication: No family present at bedside Disposition Plan: Anticipate Discharge within the next 24-48 hours if medically stable   Consultants:   Diabetes Education Coordinator   Procedures:  None   Antimicrobials:  Anti-infectives (From admission, onward)   None     Subjective: Seen and examined and states diarrhea was still bothering him. No CP or SOB. States he knows how to give himself insulin. No other concerns or complaints.    Objective: Vitals:   03/04/17 0520 03/04/17 0933 03/04/17 1403 03/04/17 1730  BP: 123/68 123/83 124/88 125/82  Pulse: 67 66 73 64  Resp: 18 18 18 18   Temp: 97.9 F (36.6 C) 99 F (37.2 C) 98.1 F (36.7 C) 98.4 F (36.9 C)  TempSrc: Oral Oral Oral Oral  SpO2: 100% 100% 100% 100%  Weight:      Height:        Intake/Output Summary (Last 24 hours) at 03/04/2017 1848 Last data filed at 03/04/2017 1300 Gross per 24 hour  Intake 2250 ml  Output 3 ml  Net 2247 ml   Filed Weights   03/01/17 2255  Weight: 59 kg (130 lb)   Examination: Physical Exam:  Constitutional: WN/WD Caucasian Male in NAD appears calm Eyes:  Sclerae anicteric. Lids normal ENMT: External Ears and nose appear normal. MMM Neck: Supple with no JVD Respiratory:  CTAB; Unlabored breathing, No wheezing/rales/rhonchi Cardiovascular: RRR; No m/r/g. S1 S2. No extremity edema Abdomen: Soft, NT, ND. Bowel Sounds present GU: Deferred Musculoskeletal: No contractures. No cyanosis Skin: No rashes or lesions on a limited skin eval Neurologic: CN 2-12 grossly intact. No appreciable focal deficits Psychiatric: Depressed appearing mood and affect. Intact judgement and insight  Data Reviewed: I have personally reviewed following labs and imaging  studies  CBC: Recent Labs  Lab 03/01/17 2303 03/03/17 1019 03/04/17 0354  WBC 8.3 3.9* 3.3*  NEUTROABS  --  2.5 1.8  HGB 14.1 12.0* 12.4*  HCT 39.2 33.2* 35.2*  MCV 92.5 91.7 92.1  PLT 130* 80* 75*   Basic Metabolic Panel: Recent Labs  Lab 03/02/17 0905  03/03/17 0010 03/03/17 0440 03/03/17 1019 03/03/17 1437 03/04/17 0354  NA  --    < > 133* 132* 132* 131* 135  K  --    < > 2.8* 2.5* 3.2* 3.4* 2.8*  CL  --    < > 91* 90* 93* 93* 97*  CO2  --    < > 28 27 26 26 28   GLUCOSE  --    < > 126* 100* 184* 237* 102*  BUN  --    < > 6 5* <5* <5* <5*  CREATININE  --    < > 0.59* 0.48* 0.53* 0.62 0.38*  CALCIUM  --    < > 8.1* 7.9* 7.8* 7.8* 8.5*  MG 1.2*  --   --   --  1.6*  --  2.0  PHOS 3.3  --   --   --  2.6  --  2.6   < > = values in this interval not displayed.   GFR: Estimated Creatinine Clearance: 94.2 mL/min (A) (by C-G formula based on SCr of 0.38 mg/dL (L)). Liver Function Tests: Recent Labs  Lab 03/04/17 0354  AST 60*  ALT 39  ALKPHOS 98  BILITOT 1.0  PROT 5.5*  ALBUMIN 3.4*   No results for input(s): LIPASE, AMYLASE in the last 168 hours. No results for input(s): AMMONIA in the last 168 hours. Coagulation Profile: No results for input(s): INR, PROTIME in the last 168 hours. Cardiac Enzymes: No results for input(s): CKTOTAL, CKMB, CKMBINDEX, TROPONINI in the last 168 hours. BNP (last 3 results) No results for input(s): PROBNP in the last 8760 hours. HbA1C: No results for input(s): HGBA1C in the last 72 hours. CBG: Recent Labs  Lab 03/03/17 1645 03/03/17 2114 03/04/17 0630 03/04/17 1124 03/04/17 1637  GLUCAP 172* 154* 83 152* 91   Lipid Profile: No results for input(s): CHOL, HDL, LDLCALC, TRIG, CHOLHDL, LDLDIRECT in the last 72 hours. Thyroid Function Tests: No results for input(s): TSH, T4TOTAL, FREET4, T3FREE, THYROIDAB in the last 72 hours. Anemia Panel: No results for input(s): VITAMINB12, FOLATE, FERRITIN, TIBC, IRON, RETICCTPCT in the  last 72 hours. Sepsis Labs: No results for input(s): PROCALCITON, LATICACIDVEN in the last 168 hours.  Recent Results (from the past 240 hour(s))  C difficile quick scan w PCR reflex     Status: None   Collection Time: 03/02/17  8:06 PM  Result Value Ref Range Status   C Diff antigen NEGATIVE NEGATIVE Final   C Diff toxin NEGATIVE NEGATIVE Final   C Diff interpretation No C. difficile detected.  Final  Gastrointestinal Panel by PCR , Stool     Status: None  Collection Time: 03/03/17  1:18 PM  Result Value Ref Range Status   Campylobacter species NOT DETECTED NOT DETECTED Final   Plesimonas shigelloides NOT DETECTED NOT DETECTED Final   Salmonella species NOT DETECTED NOT DETECTED Final   Yersinia enterocolitica NOT DETECTED NOT DETECTED Final   Vibrio species NOT DETECTED NOT DETECTED Final   Vibrio cholerae NOT DETECTED NOT DETECTED Final   Enteroaggregative E coli (EAEC) NOT DETECTED NOT DETECTED Final   Enteropathogenic E coli (EPEC) NOT DETECTED NOT DETECTED Final   Enterotoxigenic E coli (ETEC) NOT DETECTED NOT DETECTED Final   Shiga like toxin producing E coli (STEC) NOT DETECTED NOT DETECTED Final   Shigella/Enteroinvasive E coli (EIEC) NOT DETECTED NOT DETECTED Final   Cryptosporidium NOT DETECTED NOT DETECTED Final   Cyclospora cayetanensis NOT DETECTED NOT DETECTED Final   Entamoeba histolytica NOT DETECTED NOT DETECTED Final   Giardia lamblia NOT DETECTED NOT DETECTED Final   Adenovirus F40/41 NOT DETECTED NOT DETECTED Final   Astrovirus NOT DETECTED NOT DETECTED Final   Norovirus GI/GII NOT DETECTED NOT DETECTED Final   Rotavirus A NOT DETECTED NOT DETECTED Final   Sapovirus (I, II, IV, and V) NOT DETECTED NOT DETECTED Final    Radiology Studies: No results found. Scheduled Meds: . ARIPiprazole  5 mg Oral Daily  . DULoxetine  60 mg Oral Daily   And  . DULoxetine  30 mg Oral QHS  . enoxaparin (LOVENOX) injection  40 mg Subcutaneous Q24H  . folic acid  1 mg Oral  Daily  . gabapentin  600 mg Oral TID  . insulin aspart  0-15 Units Subcutaneous TID WC  . insulin aspart  0-5 Units Subcutaneous QHS  . insulin glargine  22 Units Subcutaneous BID  . lamoTRIgine  25 mg Oral Daily  . multivitamin with minerals  1 tablet Oral Daily  . nabumetone  500 mg Oral BID  . naltrexone  50 mg Oral Daily  . pantoprazole  40 mg Oral Daily  . potassium chloride  40 mEq Oral BID  . thiamine  100 mg Oral Daily   Continuous Infusions: . sodium chloride 100 mL/hr at 03/04/17 1235    LOS: 2 days   Merlene Laughtermair Latif Shayna Eblen, DO [facility] Pager [phone]  If 7PM-7AM, please contact night-coverage [email or web address] Password [email or web address] 03/04/2017, 6:48 PM

## 2017-03-04 NOTE — Progress Notes (Signed)
Pt continue to have loose B M x 3 this  shift Ci diff was negative, IV fluid infusing ,no acute distress apart from frequent stool ing.

## 2017-03-05 LAB — CBC WITH DIFFERENTIAL/PLATELET
BASOS PCT: 0 %
Basophils Absolute: 0 10*3/uL (ref 0.0–0.1)
Eosinophils Absolute: 0 10*3/uL (ref 0.0–0.7)
Eosinophils Relative: 1 %
HEMATOCRIT: 35.7 % — AB (ref 39.0–52.0)
HEMOGLOBIN: 12.6 g/dL — AB (ref 13.0–17.0)
LYMPHS ABS: 0.9 10*3/uL (ref 0.7–4.0)
Lymphocytes Relative: 32 %
MCH: 32.9 pg (ref 26.0–34.0)
MCHC: 35.3 g/dL (ref 30.0–36.0)
MCV: 93.2 fL (ref 78.0–100.0)
MONOS PCT: 9 %
Monocytes Absolute: 0.3 10*3/uL (ref 0.1–1.0)
NEUTROS ABS: 1.6 10*3/uL — AB (ref 1.7–7.7)
NEUTROS PCT: 57 %
Platelets: 85 10*3/uL — ABNORMAL LOW (ref 150–400)
RBC: 3.83 MIL/uL — AB (ref 4.22–5.81)
RDW: 12.8 % (ref 11.5–15.5)
WBC: 2.8 10*3/uL — AB (ref 4.0–10.5)

## 2017-03-05 LAB — COMPREHENSIVE METABOLIC PANEL
ALBUMIN: 3.4 g/dL — AB (ref 3.5–5.0)
ALK PHOS: 100 U/L (ref 38–126)
ALT: 75 U/L — AB (ref 17–63)
ANION GAP: 7 (ref 5–15)
AST: 120 U/L — ABNORMAL HIGH (ref 15–41)
BILIRUBIN TOTAL: 0.7 mg/dL (ref 0.3–1.2)
BUN: <5 mg/dL — ABNORMAL LOW (ref 6–20)
CALCIUM: 8.9 mg/dL (ref 8.9–10.3)
CO2: 26 mmol/L (ref 22–32)
CREATININE: 0.41 mg/dL — AB (ref 0.61–1.24)
Chloride: 101 mmol/L (ref 101–111)
GFR calc Af Amer: >60 mL/min (ref >60)
GFR calc non Af Amer: >60 mL/min (ref >60)
GLUCOSE: 177 mg/dL — AB (ref 65–99)
Potassium: 3.6 mmol/L (ref 3.5–5.1)
SODIUM: 134 mmol/L — AB (ref 135–145)
TOTAL PROTEIN: 5.7 g/dL — AB (ref 6.5–8.1)

## 2017-03-05 LAB — PHOSPHORUS: PHOSPHORUS: 2.9 mg/dL (ref 2.5–4.6)

## 2017-03-05 LAB — MAGNESIUM: Magnesium: 1.6 mg/dL — ABNORMAL LOW (ref 1.7–2.4)

## 2017-03-05 LAB — GLUCOSE, CAPILLARY
GLUCOSE-CAPILLARY: 160 mg/dL — AB (ref 65–99)
Glucose-Capillary: 214 mg/dL — ABNORMAL HIGH (ref 65–99)

## 2017-03-05 MED ORDER — MAGNESIUM SULFATE 2 GM/50ML IV SOLN
2.0000 g | Freq: Once | INTRAVENOUS | Status: AC
  Administered 2017-03-05: 2 g via INTRAVENOUS
  Filled 2017-03-05: qty 50

## 2017-03-05 MED ORDER — CLONAZEPAM 1 MG PO TABS: 1.0000 mg | ORAL_TABLET | Freq: Two times a day (BID) | ORAL | 0 refills | Status: AC | PRN

## 2017-03-05 MED ORDER — PANTOPRAZOLE SODIUM 40 MG PO TBEC: 40.0000 mg | DELAYED_RELEASE_TABLET | Freq: Every day | ORAL | 0 refills | Status: AC

## 2017-03-05 MED ORDER — LAMOTRIGINE 25 MG PO TABS: 25.0000 mg | ORAL_TABLET | Freq: Every day | ORAL | 0 refills | Status: AC

## 2017-03-05 MED ORDER — ADULT MULTIVITAMIN W/MINERALS CH: 1.0000 | ORAL_TABLET | Freq: Every day | ORAL | 0 refills | Status: DC

## 2017-03-05 MED ORDER — FOLIC ACID 1 MG PO TABS: 1.0000 mg | ORAL_TABLET | Freq: Every day | ORAL | 0 refills | Status: AC

## 2017-03-05 MED ORDER — ADULT MULTIVITAMIN W/MINERALS CH: 1.0000 | ORAL_TABLET | Freq: Every day | ORAL | 0 refills | Status: AC

## 2017-03-05 MED ORDER — THIAMINE HCL 100 MG PO TABS: 100.0000 mg | ORAL_TABLET | Freq: Every day | ORAL | 0 refills | Status: AC

## 2017-03-05 MED ORDER — ARIPIPRAZOLE 5 MG PO TABS: 5.0000 mg | ORAL_TABLET | Freq: Every day | ORAL | 0 refills | Status: AC

## 2017-03-05 MED ORDER — NABUMETONE 500 MG PO TABS: 500.0000 mg | ORAL_TABLET | Freq: Two times a day (BID) | ORAL | 0 refills | Status: AC

## 2017-03-05 MED ORDER — CLONAZEPAM 1 MG PO TABS: 1.0000 mg | ORAL_TABLET | Freq: Two times a day (BID) | ORAL | Status: DC | PRN

## 2017-03-05 MED ORDER — METHOCARBAMOL 500 MG PO TABS: 500.0000 mg | ORAL_TABLET | Freq: Two times a day (BID) | ORAL | 0 refills | Status: AC | PRN

## 2017-03-05 MED ORDER — CLONAZEPAM 1 MG PO TABS
1.0000 mg | ORAL_TABLET | Freq: Two times a day (BID) | ORAL | 0 refills | Status: DC | PRN
Start: 2017-03-05 — End: 2017-03-05

## 2017-03-05 MED ORDER — HYDROXYZINE PAMOATE 50 MG PO CAPS: 50.0000 mg | ORAL_CAPSULE | Freq: Two times a day (BID) | ORAL | 0 refills | Status: AC | PRN

## 2017-03-05 NOTE — Discharge Summary (Signed)
Physician Discharge Summary  TRAIVON MORRICAL DJM:[id number] DOB: [birth year] DOA: 03/02/2017  PCP: Loraine Leriche., MD  Admit date: 03/02/2017 Discharge date: 03/05/2017  Admitted From: Home Disposition:  Home with Home Health RN  Recommendations for Outpatient Follow-up:  1. Follow up with PCP in 1-2 weeks 2. Please obtain CMP/CBC, Mag, Phos in one week 3. Please follow up on the following pending results:  Home Health: YES Equipment/Devices: None  Discharge Condition: Stable  CODE STATUS: FULL CODE Diet recommendation: Heart Healthy Carb Modified Diet   Brief/Interim Summary: Terry Schultz Fisheris a 46 y.o.malewith medical history significant fordiabetes mellitus 2 on insulin with frequent admissions for nonadherence to medical therapies, severe bipolar disorder, alcohol abuse, history of renal calculi. Patient was last discharged on 11/18 after an admission for DKA secondary to nonadherence to administration of home insulins and was not checking sugars regularly. Since that hospitalization he is regularly followed up with nurse practitioner at his job. He has apparently been started on a freestyle libre glucose monitoring device which allows him to keep a specialized patch in place that allows the monitor to remotely detect CBGs without a fingerstick each occurrence. Patient reports that during the weekdays the nurse at work administers his Lantus insulin but he is reluctant to give his injections due to reports of pain in fear of needles. He admits to not taking his insulin for several days and states today is a day his libre patch runs out. He was last evaluated by the work Designer, jewellery on 11/29. According to her note patient had not applied a new libre patch because he states he could not reach the back of his arm to apply it. He also admitted to utilizing NyQuil every night for reported cough. She also discussed extensively with him his plans regarding keeping his  psychiatric and AA appointments. He apparently is in the process of applying for personally that work as well. Note he has not returned the alcohol rehab paperwork to the clinic.  He presented to the ER with reports of chest discomfort and an episode of emesis. The chest discomfort was not related to exertion, he did not have any shortness of breath or diaphoresis. His EKG was unremarkable for any acute ischemic changes and his initial troponin was normal. His initial glucose was 593 with an anion gap of 29. EDP has ordered fluid boluses. He is also been given an injection of Toradol for lower extremity pain. Admitted for DKA 2/2 to Medication Non-compliance. He improved and Blood Sugar was better controlled. Hospitalization was complicated by Electrolyte disturbances and diarrhea. His electrolytes improved and diarrhea slowed (Not infectious). He was deemed medically stable to D/C Home and follow up with PCP. He was strongly advised to remain compliant with his Insulin regimen and follow up with PCP for further adjustments.   Discharge Diagnoses:  Principal Problem:   DKA (diabetic ketoacidoses) ([place]) Active Problems:   Bipolar disorder ([place])   GERD (gastroesophageal reflux disease)   High cholesterol   Hypertension   Alcohol abuse   Nonadherence to medication   Thrombocytopenia ([facility])  DKA (diabetic ketoacidoses) 2/2 Nonadherence to medication, improved -Presented to the ER with chest pain and vomiting likely secondary to diabetic gastroparesis in the context of recurrent DKA -Patient freely admits to not self administering insulin and only takes insulin regularly when administered by workplace nurse to his reluctance to utilize needles -Workplace NP has made multiple adjustments in patient's diabetic regimen including insulins, assistance with administration of insulins, and  change in glucometer so patient would not have to perform fingersticks but he repeatedly has remained noncompliant.  Recently he told workplace NP he did not replace his freestyle libre monitoring device because he could not reach the back of his arm yet he told me he was unable to obtain the prescription prompting me to ask case management to ensure her insurance would pay for this product. -Diabetes Education Coordinator consulted. -Lantus 22 units BID and Moderate Novolog SSI AC/HS while hospitalized -Can go back on Home Diabetic Regimen  -Continued aggressive volume resuscitation and improved  -BMET was cycled q4h; D/C IVF NS at 100 mL/hr -C/w Carb Modified Diet  -CBG's ranging from 91-214 -It is clear that patient's underlying psychiatric condition and persistent severe depression is influencing his ability to manage his diabetes along with Alcohol Abuse. -Needs PCP follow to go back to work with no restrictions and PCP to Determine  Bipolar Disorder  -Patient is on a multitude of medications without any improvement in symptoms and self medicates with alcohol-containing products(NyQuil) -Patient does have a private psychiatrist and apparently is in the process of obtaining personal leave from work for at least 2 weeks to attend mental health appointments during the holiday season but it is unclear if he is pursued this option -Continue preadmission Abilify, Klonopin, Cymbalta, Lamictal, andprnVistaril -Does not have any SI/HI but clearly would benefit from voluntary inpatient psychiatric treatment  -Follow up with Dr. Mariea Clonts for further evaluation   Hypertension -Blood pressure currently suboptimal in context of significant dehydration and DKA -Held preadmission carvedilol but ok to resume  Alcohol abuse -Patient has informed workplace NP as well hospitalist team in the past that he stopped alcohol but is very vague as to when his last drink was -When questioned, he reports he utilizes NyQuil regularly/daily at "2-3 times a dose on supposed to take" and this is likely to replace the alcohol he  states he is not drinking -Alcohol level  <10 and UDS Negative  -According to workplace NP note as of 11/29 patient had not returned the alcohol rehab paperwork to the clinic -Continue preadmission Naltrexone  Peripheral neuropathy -Continue preadmission pain medications:Robaxin, Relafen -Avoid utilization of opiates or opioid-like products given concomitant administration of naltrexone as above  GERD (Gastroesophageal Reflux Disease) -C/w po Pantoprazole 40 mg Daily  HLD -Review of MAR shows patient not on Statin -Will need to follow up with PCP  Thrombocytopenia  -Chronic ongoing problem and likely related to ongoing substance abuse and poorly controlled diabetes -Platelet Count went from 130 -> 80 -> 75 -> 85 -Continue to Monitor and Repeat CBC in AM   Diarrhea, improved -C Difficile Negative -GI Pathogen Panel Negative -Started Imodium for Diarrhea -D/C'd IVF Rehydration  Hypomagnesemia -Mag Level was 1.6  -Replete prior to D/C -Repeat Mag Level in AM  Hypokalemia -K+ went from 3.4 -> 2.8 -> 3.6 -Likely from Diarrhea -Replete prior to D/C -Repeat CMP in AM   Abnormal LFT's -Likely 2/2 to EtOH Abuse -Characteristic 2:1 Pattern -Patient States he takes Nyquil -Follow up with PCP as an outpatient for Further workup and evaluation  Discharge Instructions  Discharge Instructions    Call MD for:  difficulty breathing, headache or visual disturbances   Complete by:  As directed    Call MD for:  extreme fatigue   Complete by:  As directed    Call MD for:  hives   Complete by:  As directed    Call MD for:  persistant dizziness or  light-headedness   Complete by:  As directed    Call MD for:  persistant nausea and vomiting   Complete by:  As directed    Call MD for:  redness, tenderness, or signs of infection (pain, swelling, redness, odor or green/yellow discharge around incision site)   Complete by:  As directed    Call MD for:  severe uncontrolled pain    Complete by:  As directed    Call MD for:  temperature >100.4   Complete by:  As directed    Diet - low sodium heart healthy   Complete by:  As directed    Diet Carb Modified   Complete by:  As directed    Discharge instructions   Complete by:  As directed    Follow up with PCP and Diabetic Education. Take all medications as prescribed. If symptoms change or worsen please return to the ED for evaluation.   Increase activity slowly   Complete by:  As directed      Allergies as of 03/05/2017      Reactions   Metformin Other (See Comments)   Reported lactic acidosis by hospital record   Morphine Itching   Penicillins Itching   Has patient had a PCN reaction causing immediate rash, facial/tongue/throat swelling, SOB or lightheadedness with hypotension: No Has patient had a PCN reaction causing severe rash involving mucus membranes or skin necrosis: No Has patient had a PCN reaction that required hospitalization: No Has patient had a PCN reaction occurring within the last 10 years: Yes If all of the above answers are "NO", then may proceed with Cephalosporin use.   Amoxicillin Rash   Meperidine Itching      Medication List    STOP taking these medications   escitalopram 20 MG tablet Commonly known as:  LEXAPRO     TAKE these medications   ARIPiprazole 5 MG tablet Commonly known as:  ABILIFY Take 1 tablet (5 mg total) by mouth daily. Start taking on:  03/06/2017   BAYER MICROLET LANCETS lancets 1 each by Other route 4 (four) times daily. Puncture skin   blood glucose meter kit and supplies Dispense based on patient and insurance preference. Use up to four times daily as directed. (FOR ICD-9 250.00, 250.01).   clonazePAM 1 MG tablet Commonly known as:  KLONOPIN Take 1 tablet (1 mg total) by mouth 2 (two) times daily as needed (anxiety).   Continuous Blood Gluc Sensor Misc 1 each as directed by Does not apply route. Use as directed every 10 days. May dispense FreeStyle  [employer] or similar.   DULoxetine 30 MG capsule Commonly known as:  CYMBALTA 60 mg(2 tabs) in morning, 30 mg(1 tab) in the evening What changed:    how much to take  how to take this  when to take this  additional instructions   fluticasone 50 MCG/ACT nasal spray Commonly known as:  FLONASE Place 1 spray into both nostrils 2 (two) times daily.   folic acid 1 MG tablet Commonly known as:  FOLVITE Take 1 tablet (1 mg total) by mouth daily. Start taking on:  03/06/2017   gabapentin 600 MG tablet Commonly known as:  NEURONTIN Take 1 tablet (600 mg total) 3 (three) times daily by mouth.   glucose blood test strip Commonly known as:  BAYER CONTOUR TEST 1 each by Other route 4 (four) times daily.   hydrOXYzine 50 MG capsule Commonly known as:  VISTARIL Take 1 capsule (50 mg total) by  mouth 2 (two) times daily as needed for anxiety. What changed:  reasons to take this   Insulin Glargine 100 UNIT/ML Solostar Pen Commonly known as:  LANTUS SOLOSTAR Inject 22 Units into the skin daily. What changed:  when to take this   insulin lispro 100 UNIT/ML injection Commonly known as:  HUMALOG Inject 0-0.1 mLs (0-10 Units total) into the skin 3 (three) times daily before meals. Per sliding scale What changed:    how much to take  when to take this  additional instructions   Insulin Pen Needle 31G X 5 MM Misc Use daily with lantus solostar pen   Insulin Syringe-Needle U-100 30G X 1/2" 0.3 ML Misc 1 Syringe by Does not apply route 3 (three) times daily as needed (with meals).   JARDIANCE 10 MG Tabs tablet Generic drug:  empagliflozin Take 10 mg by mouth daily.   lamoTRIgine 25 MG tablet Commonly known as:  LAMICTAL Take 1 tablet (25 mg total) by mouth daily. Start taking on:  03/06/2017   loperamide 2 MG capsule Commonly known as:  IMODIUM Take 1 capsule (2 mg total) by mouth as needed for diarrhea or loose stools.   methocarbamol 500 MG tablet Commonly  known as:  ROBAXIN Take 1 tablet (500 mg total) by mouth 2 (two) times daily as needed for muscle spasms. What changed:  reasons to take this   multivitamin with minerals Tabs tablet Take 1 tablet by mouth daily. Start taking on:  03/06/2017   nabumetone 500 MG tablet Commonly known as:  RELAFEN Take 1 tablet (500 mg total) by mouth 2 (two) times daily.   naltrexone 50 MG tablet Commonly known as:  DEPADE Take 1 tablet (50 mg total) daily by mouth.   NYQUIL COLD & FLU PO Take 15 mLs by mouth daily as needed (cold symptoms).   pantoprazole 40 MG tablet Commonly known as:  PROTONIX Take 1 tablet (40 mg total) by mouth daily. Start taking on:  03/06/2017   potassium chloride SA 20 MEQ tablet Commonly known as:  K-DUR,KLOR-CON Take 2 tablets (40 mEq total) daily by mouth.   sodium chloride 0.65 % Soln nasal spray Commonly known as:  OCEAN Place 2 sprays into both nostrils every 2 (two) hours while awake.   thiamine 100 MG tablet Take 1 tablet (100 mg total) by mouth daily. Start taking on:  03/06/2017   Vitamin D (Ergocalciferol) 50000 units Caps capsule Commonly known as:  DRISDOL Take 50,000 Units by mouth every 7 (seven) days.      Follow-up Information    Valazquez,Gretchen Follow up on 03/10/2017.   Why:  Your appointment is at 3:20 pm. Contact information: You will be seeing Fredric Mare at this appointment.         Allergies  Allergen Reactions  . Metformin Other (See Comments)    Reported lactic acidosis by hospital record  . Morphine Itching  . Penicillins Itching    Has patient had a PCN reaction causing immediate rash, facial/tongue/throat swelling, SOB or lightheadedness with hypotension: No Has patient had a PCN reaction causing severe rash involving mucus membranes or skin necrosis: No Has patient had a PCN reaction that required hospitalization: No Has patient had a PCN reaction occurring within the last 10 years: Yes If all of the above answers  are "NO", then may proceed with Cephalosporin use.  Marland Kitchen Amoxicillin Rash  . Meperidine Itching   Consultations:  Diabetic Education Coordinator  Procedures/Studies: Dg Chest 2 View  Result Date:  03/02/2017 CLINICAL DATA:  46 year old male with mid chest pain and shortness of breath. EXAM: CHEST  2 VIEW COMPARISON:  Chest radiograph dated 01/19/2017 FINDINGS: The lungs are clear. There is no pleural effusion or pneumothorax. The cardiac silhouette is within normal limits. No acute osseous pathology. IMPRESSION: No active cardiopulmonary disease. Electronically Signed   By: Anner Crete M.D.   On: 03/02/2017 00:16    Subjective: Seen and examined at bedside and stated diarrhea slowed down. No CP or SOB. No other concerns or complaints and ready to go home.  Discharge Exam: Vitals:   03/05/17 0500 03/05/17 0853  BP: 124/81 98/71  Pulse: 75 100  Resp: 18   Temp: 98.4 F (36.9 C) 98 F (36.7 C)  SpO2: 100% 100%   Vitals:   03/04/17 2118 03/05/17 0100 03/05/17 0500 03/05/17 0853  BP: (!) 135/91 128/85 124/81 98/71  Pulse: 80 75 75 100  Resp: _0 Temp: 98.5 F (36.9 C) 98.5 F (36.9 C) 98.4 F (36.9 C) 98 F (36.7 C)  TempSrc: Oral Oral Oral Oral  SpO2: 100% 100% 100% 100%  Weight:      Height:       General: Pt is alert, awake, not in acute distress Cardiovascular: RRR, S1/S2 +, no rubs, no gallops Respiratory: CTA bilaterally, no wheezing, no rhonchi Abdominal: Soft, NT, ND, bowel sounds + Extremities: no edema, no cyanosis  The results of significant diagnostics from this hospitalization (including imaging, microbiology, ancillary and laboratory) are listed below for reference.    Microbiology: Recent Results (from the past 240 hour(s))  C difficile quick scan w PCR reflex     Status: None   Collection Time: 03/02/17  8:06 PM  Result Value Ref Range Status   C Diff antigen NEGATIVE NEGATIVE Final   C Diff toxin NEGATIVE NEGATIVE Final   C Diff  interpretation No C. difficile detected.  Final  Gastrointestinal Panel by PCR , Stool     Status: None   Collection Time: 03/03/17  1:18 PM  Result Value Ref Range Status   Campylobacter species NOT DETECTED NOT DETECTED Final   Plesimonas shigelloides NOT DETECTED NOT DETECTED Final   Salmonella species NOT DETECTED NOT DETECTED Final   Yersinia enterocolitica NOT DETECTED NOT DETECTED Final   Vibrio species NOT DETECTED NOT DETECTED Final   Vibrio cholerae NOT DETECTED NOT DETECTED Final   Enteroaggregative E coli (EAEC) NOT DETECTED NOT DETECTED Final   Enteropathogenic E coli (EPEC) NOT DETECTED NOT DETECTED Final   Enterotoxigenic E coli (ETEC) NOT DETECTED NOT DETECTED Final   Shiga like toxin producing E coli (STEC) NOT DETECTED NOT DETECTED Final   Shigella/Enteroinvasive E coli (EIEC) NOT DETECTED NOT DETECTED Final   Cryptosporidium NOT DETECTED NOT DETECTED Final   Cyclospora cayetanensis NOT DETECTED NOT DETECTED Final   Entamoeba histolytica NOT DETECTED NOT DETECTED Final   Giardia lamblia NOT DETECTED NOT DETECTED Final   Adenovirus F40/41 NOT DETECTED NOT DETECTED Final   Astrovirus NOT DETECTED NOT DETECTED Final   Norovirus GI/GII NOT DETECTED NOT DETECTED Final   Rotavirus A NOT DETECTED NOT DETECTED Final   Sapovirus (I, II, IV, and V) NOT DETECTED NOT DETECTED Final    Labs: BNP (last 3 results) No results for input(s): BNP in the last 8760 hours. Basic Metabolic Panel: Recent Labs  Lab 03/02/17 0905  03/03/17 0440 03/03/17 1019 03/03/17 1437 03/04/17 0354 03/05/17 0301  NA  --    < > 132*  132* 131* 135 134*  K  --    < > 2.5* 3.2* 3.4* 2.8* 3.6  CL  --    < > 90* 93* 93* 97* 101  CO2  --    < > _0 GLUCOSE  --    < > 100* 184* 237* 102* 177*  BUN  --    < > 5* <5* <5* <5* <5*  CREATININE  --    < > 0.48* 0.53* 0.62 0.38* 0.41*  CALCIUM  --    < > 7.9* 7.8* 7.8* 8.5* 8.9  MG 1.2*  --   --  1.6*  --  2.0 1.6*  PHOS 3.3  --   --  2.6  --   2.6 2.9   < > = values in this interval not displayed.   Liver Function Tests: Recent Labs  Lab 03/04/17 0354 03/05/17 0301  AST 60* 120*  ALT 39 75*  ALKPHOS 98 100  BILITOT 1.0 0.7  PROT 5.5* 5.7*  ALBUMIN 3.4* 3.4*   No results for input(s): LIPASE, AMYLASE in the last 168 hours. No results for input(s): AMMONIA in the last 168 hours. CBC: Recent Labs  Lab 03/01/17 2303 03/03/17 1019 03/04/17 0354 03/05/17 0301  WBC 8.3 3.9* 3.3* 2.8*  NEUTROABS  --  2.5 1.8 1.6*  HGB 14.1 12.0* 12.4* 12.6*  HCT 39.2 33.2* 35.2* 35.7*  MCV 92.5 91.7 92.1 93.2  PLT 130* 80* 75* 85*   Cardiac Enzymes: No results for input(s): CKTOTAL, CKMB, CKMBINDEX, TROPONINI in the last 168 hours. BNP: Invalid input(s): POCBNP CBG: Recent Labs  Lab 03/04/17 1124 03/04/17 1637 03/04/17 2115 03/05/17 0614 03/05/17 1114  GLUCAP 152* 91 275* 160* 214*   D-Dimer No results for input(s): DDIMER in the last 72 hours. Hgb A1c No results for input(s): HGBA1C in the last 72 hours. Lipid Profile No results for input(s): CHOL, HDL, LDLCALC, TRIG, CHOLHDL, LDLDIRECT in the last 72 hours. Thyroid function studies No results for input(s): TSH, T4TOTAL, T3FREE, THYROIDAB in the last 72 hours.  Invalid input(s): FREET3 Anemia work up No results for input(s): VITAMINB12, FOLATE, FERRITIN, TIBC, IRON, RETICCTPCT in the last 72 hours. Urinalysis    Component Value Date/Time   COLORURINE STRAW (A) 02/11/2017 1128   APPEARANCEUR CLEAR 02/11/2017 1128   LABSPEC 1.023 02/11/2017 1128   PHURINE 6.0 02/11/2017 1128   GLUCOSEU >=500 (A) 02/11/2017 1128   HGBUR MODERATE (A) 02/11/2017 1128   BILIRUBINUR NEGATIVE 02/11/2017 1128   KETONESUR 5 (A) 02/11/2017 1128   PROTEINUR NEGATIVE 02/11/2017 1128   UROBILINOGEN 0.2 05/31/2009 1242   NITRITE NEGATIVE 02/11/2017 1128   LEUKOCYTESUR NEGATIVE 02/11/2017 1128   Sepsis Labs Invalid input(s): PROCALCITONIN,  WBC,  LACTICIDVEN Microbiology Recent Results  (from the past 240 hour(s))  C difficile quick scan w PCR reflex     Status: None   Collection Time: 03/02/17  8:06 PM  Result Value Ref Range Status   C Diff antigen NEGATIVE NEGATIVE Final   C Diff toxin NEGATIVE NEGATIVE Final   C Diff interpretation No C. difficile detected.  Final  Gastrointestinal Panel by PCR , Stool     Status: None   Collection Time: 03/03/17  1:18 PM  Result Value Ref Range Status   Campylobacter species NOT DETECTED NOT DETECTED Final   Plesimonas shigelloides NOT DETECTED NOT DETECTED Final   Salmonella species NOT DETECTED NOT DETECTED Final   Yersinia enterocolitica NOT DETECTED NOT DETECTED Final  Vibrio species NOT DETECTED NOT DETECTED Final   Vibrio cholerae NOT DETECTED NOT DETECTED Final   Enteroaggregative E coli (EAEC) NOT DETECTED NOT DETECTED Final   Enteropathogenic E coli (EPEC) NOT DETECTED NOT DETECTED Final   Enterotoxigenic E coli (ETEC) NOT DETECTED NOT DETECTED Final   Shiga like toxin producing E coli (STEC) NOT DETECTED NOT DETECTED Final   Shigella/Enteroinvasive E coli (EIEC) NOT DETECTED NOT DETECTED Final   Cryptosporidium NOT DETECTED NOT DETECTED Final   Cyclospora cayetanensis NOT DETECTED NOT DETECTED Final   Entamoeba histolytica NOT DETECTED NOT DETECTED Final   Giardia lamblia NOT DETECTED NOT DETECTED Final   Adenovirus F40/41 NOT DETECTED NOT DETECTED Final   Astrovirus NOT DETECTED NOT DETECTED Final   Norovirus GI/GII NOT DETECTED NOT DETECTED Final   Rotavirus A NOT DETECTED NOT DETECTED Final   Sapovirus (I, II, IV, and V) NOT DETECTED NOT DETECTED Final   Time coordinating discharge: 35 minutes  SIGNED:  Kerney Elbe, DO [facility] 03/05/2017, 9:37 PM Pager [phone]  If 7PM-7AM, please contact night-coverage [email or web address] Password [email or web address]

## 2017-03-05 NOTE — Care Management Note (Signed)
Case Management Note  Patient Details  Name: Mickeal NeedyRobert F Polimeni MRN: [id number] Date of Birth: 08/11/1970  Subjective/Objective:                    Action/Plan: [facility] and check on his DM management at home. CM provided the patient choice and he selected [facility]. Adacia with [facility] notified and accepted the referral.  [place] [facility]   Expected Discharge Date:  03/05/17               Expected Discharge Plan:  Home/Self Care  In-House Referral:     Discharge planning Services  CM Consult, Other - See comment(preauth for diabetes equipment)  Post Acute Care Choice:  Home Health Choice offered to:  Patient  DME Arranged:    DME Agency:     HH Arranged:  RN HH Agency:  [facility]  Status of Service:  Completed, signed off  If discussed at Long Length of Stay Meetings, dates discussed:    Additional Comments:  Kermit BaloKelli F Nelva Hauk, RN 03/05/2017, 2:18 PM

## 2017-03-05 NOTE — Plan of Care (Signed)
  Education: Knowledge of General Education information will improve 03/05/2017 0340 - Progressing by Ashley RoyaltyEdoh, Keeven Matty I, RN   Progressing Education: Knowledge of General Education information will improve 03/05/2017 0340 - Progressing by Ashley RoyaltyEdoh, Nyleah Mcginnis I, RN Clinical Measurements: Ability to maintain clinical measurements within normal limits will improve 03/05/2017 0340 - Progressing by Azzie RoupEdoh, Kenyatta Gloeckner I, RN Will remain free from infection 03/05/2017 0340 - Progressing by Ashley RoyaltyEdoh, Kian Ottaviano I, RN Diagnostic test results will improve 03/05/2017 0340 - Progressing by Azzie RoupEdoh, Jolea Dolle I, RN Respiratory complications will improve 03/05/2017 0340 - Progressing by Azzie RoupEdoh, Anaiza Behrens I, RN Safety: Ability to remain free from injury will improve 03/05/2017 0340 - Progressing by Azzie RoupEdoh, Chee Kinslow I, RN Skin Integrity: Risk for impaired skin integrity will decrease 03/05/2017 0340 - Progressing by Ashley RoyaltyEdoh, Damyon Mullane I, RN

## 2017-03-09 ENCOUNTER — Encounter: Payer: Self-pay | Admitting: Registered Nurse

## 2017-03-09 NOTE — Telephone Encounter (Signed)
Discharged 7 Dec.  [facility] closed until 09 Mar 2017 unsure if patient coming in to work today.  Please follow up with patient.

## 2017-03-09 NOTE — Telephone Encounter (Signed)
Notified by RN Rolly SalterHaley patient awaiting PCM clearance to return to work.  PCM Appt scheduled tomorrow. Labs due 1 week post discharge. Per [employer] HR rep Crisoforo OxfordElizabeth Privette patient's personal leave request for 2 weeks off was denied but  Patient still pending notification due to hospitalization.

## 2017-03-11 ENCOUNTER — Telehealth: Payer: Self-pay | Admitting: *Deleted

## 2017-03-11 NOTE — Telephone Encounter (Signed)
RN made aware by HR supervisor Terry OxfordElizabeth Schultz that pt returned to work without release to work note from PCP whom he saw yesterday afternoon. Per their notes in epic, pt is unable to be released to work as he was nauseous and had vomited once that day due to hyperglycemia, 534 on labs yesterday. Per notes, pt "had omitted his novolog that day." Per HR, pt ultimately reported to them that he could not return until he had another follow up with pcp in 1 week for reevaluation. Appt pending for Monday 12/17 with Terry NasutiLauren Schultz, another PA in pcp practice. PCP did not change any insulin meds. Pt reported to RN via phone last week that he was using fingerstick meter to check cbgs instead of libre patch. Of note, did discontinue Nabumetone 2/2 possible worsening of nausea. Pt reported to PA that he was abstaining from etoh and attending AA meetings in [place] Tu, W, Th. Will f/u on Monday for further plan of care.

## 2017-03-12 NOTE — Telephone Encounter (Signed)
Noted will follow up with patient next week if released to return to work

## 2017-03-15 ENCOUNTER — Other Ambulatory Visit: Payer: Self-pay

## 2017-03-15 ENCOUNTER — Encounter (HOSPITAL_COMMUNITY): Payer: Self-pay

## 2017-03-15 ENCOUNTER — Emergency Department (HOSPITAL_COMMUNITY)
Admission: EM | Admit: 2017-03-15 | Discharge: 2017-03-15 | Disposition: A | Discharge status: Home / Self Care | Payer: No Typology Code available for payment source | Attending: Emergency Medicine | Admitting: Emergency Medicine

## 2017-03-15 DIAGNOSIS — E1142 Type 2 diabetes mellitus with diabetic polyneuropathy: Secondary | ICD-10-CM | POA: Insufficient documentation

## 2017-03-15 DIAGNOSIS — F17210 Nicotine dependence, cigarettes, uncomplicated: Secondary | ICD-10-CM | POA: Insufficient documentation

## 2017-03-15 DIAGNOSIS — I10 Essential (primary) hypertension: Secondary | ICD-10-CM | POA: Insufficient documentation

## 2017-03-15 DIAGNOSIS — Z79899 Other long term (current) drug therapy: Secondary | ICD-10-CM | POA: Insufficient documentation

## 2017-03-15 DIAGNOSIS — E039 Hypothyroidism, unspecified: Secondary | ICD-10-CM | POA: Diagnosis not present

## 2017-03-15 DIAGNOSIS — Z794 Long term (current) use of insulin: Secondary | ICD-10-CM | POA: Insufficient documentation

## 2017-03-15 DIAGNOSIS — R739 Hyperglycemia, unspecified: Secondary | ICD-10-CM | POA: Diagnosis present

## 2017-03-15 LAB — BASIC METABOLIC PANEL
ANION GAP: 16 — AB (ref 5–15)
BUN: 7 mg/dL (ref 6–20)
CHLORIDE: 84 mmol/L — AB (ref 101–111)
CO2: 35 mmol/L — AB (ref 22–32)
Calcium: 9.5 mg/dL (ref 8.9–10.3)
Creatinine, Ser: 0.7 mg/dL (ref 0.61–1.24)
GFR calc Af Amer: >60 mL/min (ref >60)
GLUCOSE: 183 mg/dL — AB (ref 65–99)
POTASSIUM: 3.2 mmol/L — AB (ref 3.5–5.1)
Sodium: 135 mmol/L (ref 135–145)

## 2017-03-15 LAB — CBC
HEMATOCRIT: 34.8 % — AB (ref 39.0–52.0)
HEMOGLOBIN: 12.7 g/dL — AB (ref 13.0–17.0)
MCH: 33.2 pg (ref 26.0–34.0)
MCHC: 36.5 g/dL — AB (ref 30.0–36.0)
MCV: 90.9 fL (ref 78.0–100.0)
Platelets: 148 10*3/uL — ABNORMAL LOW (ref 150–400)
RBC: 3.83 MIL/uL — ABNORMAL LOW (ref 4.22–5.81)
RDW: 12.8 % (ref 11.5–15.5)
WBC: 9.6 10*3/uL (ref 4.0–10.5)

## 2017-03-15 LAB — URINALYSIS, ROUTINE W REFLEX MICROSCOPIC
BACTERIA UA: NONE SEEN
BILIRUBIN URINE: NEGATIVE
Glucose, UA: >=500 mg/dL — AB
Ketones, ur: NEGATIVE mg/dL
Leukocytes, UA: NEGATIVE
NITRITE: NEGATIVE
PH: 8 (ref 5.0–8.0)
Protein, ur: NEGATIVE mg/dL
RBC / HPF: NONE SEEN RBC/hpf (ref 0–5)
SPECIFIC GRAVITY, URINE: 1.002 — AB (ref 1.005–1.030)
SQUAMOUS EPITHELIAL / LPF: NONE SEEN

## 2017-03-15 LAB — CBG MONITORING, ED
GLUCOSE-CAPILLARY: 215 mg/dL — AB (ref 65–99)
GLUCOSE-CAPILLARY: 299 mg/dL — AB (ref 65–99)
GLUCOSE-CAPILLARY: 291 mg/dL — AB (ref 65–99)

## 2017-03-15 MED ORDER — SODIUM CHLORIDE 0.9 % IV BOLUS (SEPSIS)
1000.0000 mL | Freq: Once | INTRAVENOUS | Status: AC
  Administered 2017-03-15: 1000 mL via INTRAVENOUS

## 2017-03-15 NOTE — ED Provider Notes (Signed)
Patient signed out to me by Ardeen JourdainM Belfi.  Please see previous notes for further history.  In brief, patient did not take his diabetes medicines today.  While he is at the doctor's office, his blood sugar was greater than 600.  He was given a dose of insulin, unsure what dose.  He is told to come to the emergency room for evaluation, as he had nausea and vomiting this morning to r/o DKA.  Since being in the ER, patient without nausea, vomiting, or abdominal pain.  Labs reassuring. He states he is feeling better.   Patient is awaiting completion of fluid bolus and blood sugar recheck.  Blood sugar with minor improvement.  Time for his nightly at home diabetes medicines.  No return of N/V.  At this time, patient appears safe for discharge.  Return precautions given.  Patient states he understands and agrees to plan.     Alveria ApleyCaccavale, Bekka Qian, PA-C 03/16/17 0126    Rolan BuccoBelfi, Melanie, MD 03/16/17 1400

## 2017-03-15 NOTE — ED Triage Notes (Signed)
Pt states he was sent here by his PCP for elevated BP as well as hyperglycemia. Pt states cbg was unreadable. Pt states he has not been taking his diabetic medications. Pt ambulatory. AOX4.

## 2017-03-15 NOTE — Discharge Instructions (Addendum)
Continue taking your at home medications as prescribed. Follow-up with your primary care doctor for further evaluation and management of your blood sugar. Return to the emergency room if you develop any new, worsening, or concerning symptoms.

## 2017-03-15 NOTE — ED Provider Notes (Signed)
[place] [facility] EMERGENCY DEPARTMENT Provider Note   CSN: [id number] Arrival date & time: 03/15/17  1543     History   Chief Complaint Chief Complaint  Patient presents with  . Hyperglycemia    HPI Terry Schultz is a 46 y.o. male.  Patient is a 46 year old male with a history of diabetes, EtOH abuse, hyperlipidemia and hypertension who presents with possible DKA with elevated blood sugars.  He states that he did not take his insulin this morning.  He had several episodes of nausea and vomiting this morning.  No abdominal pain.  He has chronic loose stools.  He went to his PCP who found him to be tachycardic, tremulous with an elevated blood sugar greater than 500 and was sent here for concern of DKA.  He was given an injection of insulin in the office.  He states he had his home insulin that was used.  He does not know exactly how much he got.  He is feeling better now.  He has had no recent vomiting.  No fevers.  No abdominal pain.  No cough or cold symptoms.      Past Medical History:  Diagnosis Date  . Alcoholism ([place])   . Anxiety   . Bipolar disorder ([place])   . Depression   . DKA (diabetic ketoacidoses) [facility])    "get it often" (03/03/2017)  . GERD (gastroesophageal reflux disease)   . High cholesterol   . History of kidney stones   . Hypertension   . Pancreatitis   . Pneumonia ~ 2012  . Sleep apnea   . Type II diabetes mellitus [facility])     Patient Active Problem List   Diagnosis Date Noted  . Bipolar disorder ([place]) 03/02/2017  . GERD (gastroesophageal reflux disease) 03/02/2017  . High cholesterol 03/02/2017  . Hypertension 03/02/2017  . Alcohol abuse 03/02/2017  . Nonadherence to medication 03/02/2017  . Thrombocytopenia ([place]) 03/02/2017  . MDD (major depressive disorder), single episode, moderate ([place]) 02/12/2017  . Hyperglycemia 02/11/2017  . Hypokalemia 01/21/2017  . Diarrhea 01/21/2017  . Pressure injury of skin 01/20/2017  .  Protein-calorie malnutrition, severe 01/20/2017  . DKA (diabetic ketoacidoses) ([place]) 12/05/2016  . Depression with anxiety 12/05/2016  . HLD (hyperlipidemia) 12/05/2016  . Chronic back pain 12/05/2016  . Alcoholism ([place])   . Hypomagnesemia 05/29/2016  . Cervical myelopathy ([place]) 05/28/2016  . Ambulatory dysfunction 05/27/2016  . Hyperglycemia, unspecified 05/27/2016  . Numbness and tingling of both legs 05/27/2016  . Chronic pain of both knees 05/21/2016  . Diabetic peripheral neuropathy ([place]) 05/21/2016  . Moderate episode of recurrent major depressive disorder ([place]) 05/22/2015  . Panic disorder 05/22/2015  . Hepatic steatosis 11/21/2013  . Nondependent alcohol abuse, in remission 09/30/2013  . Allergic rhinitis 09/30/2013  . Anxiety disorder 09/30/2013  . Chronic cholecystitis 09/30/2013  . Disorder of magnesium metabolism 09/30/2013  . Essential hypertension 09/30/2013  . Familial hypertriglyceridemia 09/30/2013  . Hypercholesterolemia 09/30/2013  . Hypothyroidism 09/30/2013  . Insomnia 09/30/2013  . Low testosterone 09/30/2013  . OSA (obstructive sleep apnea) 09/30/2013  . Pancreatic pseudocyst 09/30/2013  . Depressive disorder 09/30/2013  . Recurrent pancreatitis ([place]) 09/30/2013  . Uncontrolled type 2 diabetes mellitus with diabetic polyneuropathy, with long-term current use of insulin ([place]) 09/30/2013  . Vitamin D deficiency 09/30/2013    Past Surgical History:  Procedure Laterality Date  . CYSTOSCOPY W/ STONE MANIPULATION  X 1   "there's still stones in there" (02/11/2017)  . LAPAROSCOPIC CHOLECYSTECTOMY  Home Medications    Prior to Admission medications   Medication Sig Start Date End Date Taking? Authorizing Provider  ARIPiprazole (ABILIFY) 5 MG tablet Take 1 tablet (5 mg total) by mouth daily. 03/06/17   Sheikh, Omair Latif, DO  BAYER MICROLET LANCETS lancets 1 each by Other route 4 (four) times daily. Puncture skin 01/21/17   Dessa Phi, DO    blood glucose meter kit and supplies Dispense based on patient and insurance preference. Use up to four times daily as directed. (FOR ICD-9 250.00, 250.01). 01/21/17   Dessa Phi, DO  clonazePAM (KLONOPIN) 1 MG tablet Take 1 tablet (1 mg total) by mouth 2 (two) times daily as needed (anxiety). 03/05/17   Raiford Noble Latif, DO  Continuous Blood Gluc Sensor MISC 1 each as directed by Does not apply route. Use as directed every 10 days. May dispense FreeStyle [employer] or similar. 02/04/17   Betancourt, Aura Fey, NP  DM-Doxylamine-Acetaminophen (NYQUIL COLD & FLU PO) Take 15 mLs by mouth daily as needed (cold symptoms).    [provider]  DULoxetine (CYMBALTA) 30 MG capsule 60 mg(2 tabs) in morning, 30 mg(1 tab) in the evening Patient taking differently: Take 30-60 mg by mouth See admin instructions. 60 mg(2 tabs) in morning, 30 mg(1 tab) in the evening 02/14/17   Oretha Milch D, MD  empagliflozin (JARDIANCE) 10 MG TABS tablet Take 10 mg by mouth daily.    [provider]  fluticasone (FLONASE) 50 MCG/ACT nasal spray Place 1 spray into both nostrils 2 (two) times daily. 02/16/17 04/17/17  Betancourt, Aura Fey, NP  folic acid (FOLVITE) 1 MG tablet Take 1 tablet (1 mg total) by mouth daily. 03/06/17   Raiford Noble Latif, DO  gabapentin (NEURONTIN) 600 MG tablet Take 1 tablet (600 mg total) 3 (three) times daily by mouth. 02/14/17   Oretha Milch D, MD  glucose blood (BAYER CONTOUR TEST) test strip 1 each by Other route 4 (four) times daily. 01/21/17   Dessa Phi, DO  hydrOXYzine (VISTARIL) 50 MG capsule Take 1 capsule (50 mg total) by mouth 2 (two) times daily as needed for anxiety. 03/05/17   Raiford Noble Latif, DO  Insulin Glargine (LANTUS SOLOSTAR) 100 UNIT/ML Solostar Pen Inject 22 Units into the skin daily. Patient taking differently: Inject 22 Units into the skin 2 (two) times daily.  01/21/17   Dessa Phi, DO  insulin lispro (HUMALOG) 100 UNIT/ML injection  Inject 0-0.1 mLs (0-10 Units total) into the skin 3 (three) times daily before meals. Per sliding scale Patient taking differently: Inject 12 Units into the skin 3 (three) times daily with meals. Per sliding scale 01/21/17   Dessa Phi, DO  Insulin Pen Needle 31G X 5 MM MISC Use daily with lantus solostar pen 01/21/17   Dessa Phi, DO  Insulin Syringe-Needle U-100 30G X 1/2" 0.3 ML MISC 1 Syringe by Does not apply route 3 (three) times daily as needed (with meals). 01/21/17   Dessa Phi, DO  lamoTRIgine (LAMICTAL) 25 MG tablet Take 1 tablet (25 mg total) by mouth daily. 03/06/17   Raiford Noble Latif, DO  loperamide (IMODIUM) 2 MG capsule Take 1 capsule (2 mg total) by mouth as needed for diarrhea or loose stools. Patient not taking: Reported on 03/02/2017 01/21/17   Dessa Phi, DO  methocarbamol (ROBAXIN) 500 MG tablet Take 1 tablet (500 mg total) by mouth 2 (two) times daily as needed for muscle spasms. 03/05/17   Raiford Noble Latif, DO  Multiple Vitamin (  MULTIVITAMIN WITH MINERALS) TABS tablet Take 1 tablet by mouth daily. 03/06/17   Raiford Noble Latif, DO  nabumetone (RELAFEN) 500 MG tablet Take 1 tablet (500 mg total) by mouth 2 (two) times daily. 03/05/17   Raiford Noble Latif, DO  naltrexone (DEPADE) 50 MG tablet Take 1 tablet (50 mg total) daily by mouth. 02/14/17   Oretha Milch D, MD  pantoprazole (PROTONIX) 40 MG tablet Take 1 tablet (40 mg total) by mouth daily. 03/06/17   Raiford Noble Latif, DO  potassium chloride SA (K-DUR,KLOR-CON) 20 MEQ tablet Take 2 tablets (40 mEq total) daily by mouth. 02/14/17   Oretha Milch D, MD  sodium chloride (OCEAN) 0.65 % SOLN nasal spray Place 2 sprays into both nostrils every 2 (two) hours while awake. 02/16/17 03/18/17  Betancourt, Aura Fey, NP  thiamine 100 MG tablet Take 1 tablet (100 mg total) by mouth daily. 03/06/17   Raiford Noble Latif, DO  Vitamin D, Ergocalciferol, (DRISDOL) 50000 units CAPS capsule Take 50,000 Units by mouth every  7 (seven) days.    [provider]    Family History Family History  Problem Relation Age of Onset  . Kidney Stones Father     Social History Social History   Tobacco Use  . Smoking status: Current Some Day Smoker    Packs/day: 0.12    Years: 7.00    Pack years: 0.84    Types: Cigarettes  . Smokeless tobacco: Never Used  Substance Use Topics  . Alcohol use: Yes    Comment: 12/5//2018 "stopped September 11, 2016"  . Drug use: No     Allergies   Metformin; Morphine; Penicillins; Amoxicillin; and Meperidine   Review of Systems Review of Systems  Constitutional: Negative for chills, diaphoresis, fatigue and fever.  HENT: Negative for congestion, rhinorrhea and sneezing.   Eyes: Negative.   Respiratory: Negative for cough, chest tightness and shortness of breath.   Cardiovascular: Negative for chest pain and leg swelling.  Gastrointestinal: Positive for nausea and vomiting. Negative for abdominal pain, blood in stool and diarrhea.  Genitourinary: Negative for difficulty urinating, flank pain, frequency and hematuria.  Musculoskeletal: Negative for arthralgias and back pain.  Skin: Negative for rash.  Neurological: Negative for dizziness, speech difficulty, weakness, numbness and headaches.     Physical Exam Updated Vital Signs BP 125/75 (BP Location: Right Arm)   Pulse 92   Temp 98.9 F (37.2 C) (Oral)   Resp 14   Wt 59 kg (130 lb)   SpO2 100%   BMI 20.36 kg/m   Physical Exam  Constitutional: He is oriented to person, place, and time. He appears well-developed and well-nourished.  HENT:  Head: Normocephalic and atraumatic.  Eyes: Pupils are equal, round, and reactive to light.  Neck: Normal range of motion. Neck supple.  Cardiovascular: Normal rate, regular rhythm and normal heart sounds.  Pulmonary/Chest: Effort normal and breath sounds normal. No respiratory distress. He has no wheezes. He has no rales. He exhibits no tenderness.  Abdominal: Soft.  Bowel sounds are normal. There is no tenderness. There is no rebound and no guarding.  Musculoskeletal: Normal range of motion. He exhibits no edema.  Lymphadenopathy:    He has no cervical adenopathy.  Neurological: He is alert and oriented to person, place, and time.  Skin: Skin is warm and dry. No rash noted.  Psychiatric: He has a normal mood and affect.     ED Treatments / Results  Labs (all labs ordered are listed, but only abnormal  results are displayed) Labs Reviewed  BASIC METABOLIC PANEL - Abnormal; Notable for the following components:      Result Value   Potassium 3.2 (*)    Chloride 84 (*)    CO2 35 (*)    Glucose, Bld 183 (*)    Anion gap 16 (*)    All other components within normal limits  CBC - Abnormal; Notable for the following components:   RBC 3.83 (*)    Hemoglobin 12.7 (*)    HCT 34.8 (*)    MCHC 36.5 (*)    Platelets 148 (*)    All other components within normal limits  URINALYSIS, ROUTINE W REFLEX MICROSCOPIC - Abnormal; Notable for the following components:   Color, Urine COLORLESS (*)    Specific Gravity, Urine 1.002 (*)    Glucose, UA >=500 (*)    Hgb urine dipstick SMALL (*)    All other components within normal limits  CBG MONITORING, ED - Abnormal; Notable for the following components:   Glucose-Capillary 215 (*)    All other components within normal limits  CBG MONITORING, ED - Abnormal; Notable for the following components:   Glucose-Capillary 299 (*)    All other components within normal limits    EKG  EKG Interpretation None       Radiology No results found.  Procedures Procedures (including critical care time)  Medications Ordered in ED Medications  sodium chloride 0.9 % bolus 1,000 mL (1,000 mLs Intravenous New Bag/Given 03/15/17 2022)     Initial Impression / Assessment and Plan / ED Course  I have reviewed the triage vital signs and the nursing notes.  Pertinent labs & imaging results that were available during my  care of the patient were reviewed by me and considered in my medical decision making (see chart for details).     Patient is a 46 year old male with a history of diabetes.  There is no evidence of DKA.  There are no ketones in his urine.  He has no significant anion gap or evidence of acidosis.  He is not vomiting.  He has no abdominal pain.  He is otherwise well-appearing.  I will give him a liter of IV fluids given his earlier vomiting.  We will recheck his blood sugar prior to discharge but assuming that that is within acceptable limits, he can be discharged home with follow-up with his PCP.  Sophia Caccavale, PA-C to follow up on this and discharge as appropriate.  Final Clinical Impressions(s) / ED Diagnoses   Final diagnoses:  Hyperglycemia    ED Discharge Orders    None       Malvin Johns, MD 03/15/17 2056

## 2017-03-15 NOTE — Telephone Encounter (Signed)
Per chart review, pt was seen at pcp today. Exhibiting sx of DKA again. CBG >600 there. Was instructed to go directly to ED by PCP PA and MD. Refused ambulance transport. Insisted on driving self to ED. Note signed by PA at ~1300. Pts appt at 11am. As of 1500 pt still had not checked in at ED. Pt was seeking release to return to work originally but did not gain this. Employer made aware. Larina Earthlyreva Brackett HR rep as HR supervisor Lanora Manislizabeth out of the office this week. Pt has not made contact as of yet with RN or employer to update them on situation. As per multiple previous conversations regarding this, pt is aware he is required to update employer himself once physically able to do so.

## 2017-03-16 ENCOUNTER — Encounter: Payer: Self-pay | Admitting: *Deleted

## 2017-03-16 NOTE — Telephone Encounter (Signed)
Per NP, was unable to reach pt via phone. LMTCB. RN noted in chart that ambulatory referral was placed today for GI for recurrent chronic pancreatitis. Other referrals were also placed at last 2 visits-DM Educator and Endocrinologist. No pending appts yet at this time for these. Appt for f/u with Cheryl FlashLucy Barden, PA also noted as scheduled for 03/18/17. Will f/u with pt depending on outcome of upcoming appt.

## 2017-03-16 NOTE — Telephone Encounter (Signed)
Late entry Telephone message left at 1310 on 16 Mar 2017 for patient to contact me to discuss follow up s/p ER visit, [facility] return to work evaluation, encouraged healthy food choices and to take insulin as prescribed.

## 2017-03-16 NOTE — Telephone Encounter (Signed)
[facility] no pending appts with PCM.  Was seen and released at ER yesterday.  Will follow up with HR and patient today regarding when next appt scheduled for return to work.

## 2017-03-16 NOTE — Telephone Encounter (Signed)
Late entry patient returned call at 1351 stated he had administered insulin twice today (1 sliding scale and 1 regular dose), blood sugars in 200s, making healthy food choices and [facility] 18 Mar 2017 for re-evaluation.  Patient stated he was going to make better food choices and was currently cleaning out his kitchen cupboards.  He wants to return to work but needs clearance from his PCM per HR staff.  He is feeling much better today denied nausea/vomiting/chills.  Encouraged patient to administer insulin as prescribed by hospitalist every day, eat lean protein/vegetables at every meal, hydrate.  Patient instructed to contact clinic after his appt Thursday 20 Dec to let us know if he was cleared for work and if any further assistance required when he is at work regarding blood sugar chest/insulin administration.  Patient verbalized understanding information/instructions, agreed with plan of care and had no further questions at this time.

## 2017-03-25 ENCOUNTER — Telehealth: Payer: Self-pay | Admitting: *Deleted

## 2017-03-25 NOTE — Telephone Encounter (Signed)
Upon occ health RN's return to clinic after holiday closings, RN made aware by pt's workplace HR supervisors Crisoforo OxfordElizabeth Privette and Delford FieldScott Fleming, that pt was found deceased on Monday 12/24. No additional details available at this time. No autopsy planned. RN made pt's pcp office aware as pt had appt pending for yesterday and in January. Albina Billetina Betancourt, NP made aware also via phone as she is out of the office until next week.

## 2017-03-29 NOTE — Telephone Encounter (Signed)
Noted and agree. 

## 2017-03-30 DEATH — deceased

## 2019-02-12 IMAGING — CR DG CHEST 2V
2 series · 2 of 2 positions shown · non-contrast
Comparison: Chest x-ray 05/27/2016.

CLINICAL DATA: Weakness and dizziness.

EXAM:
CHEST  2 VIEW

[chest lat]
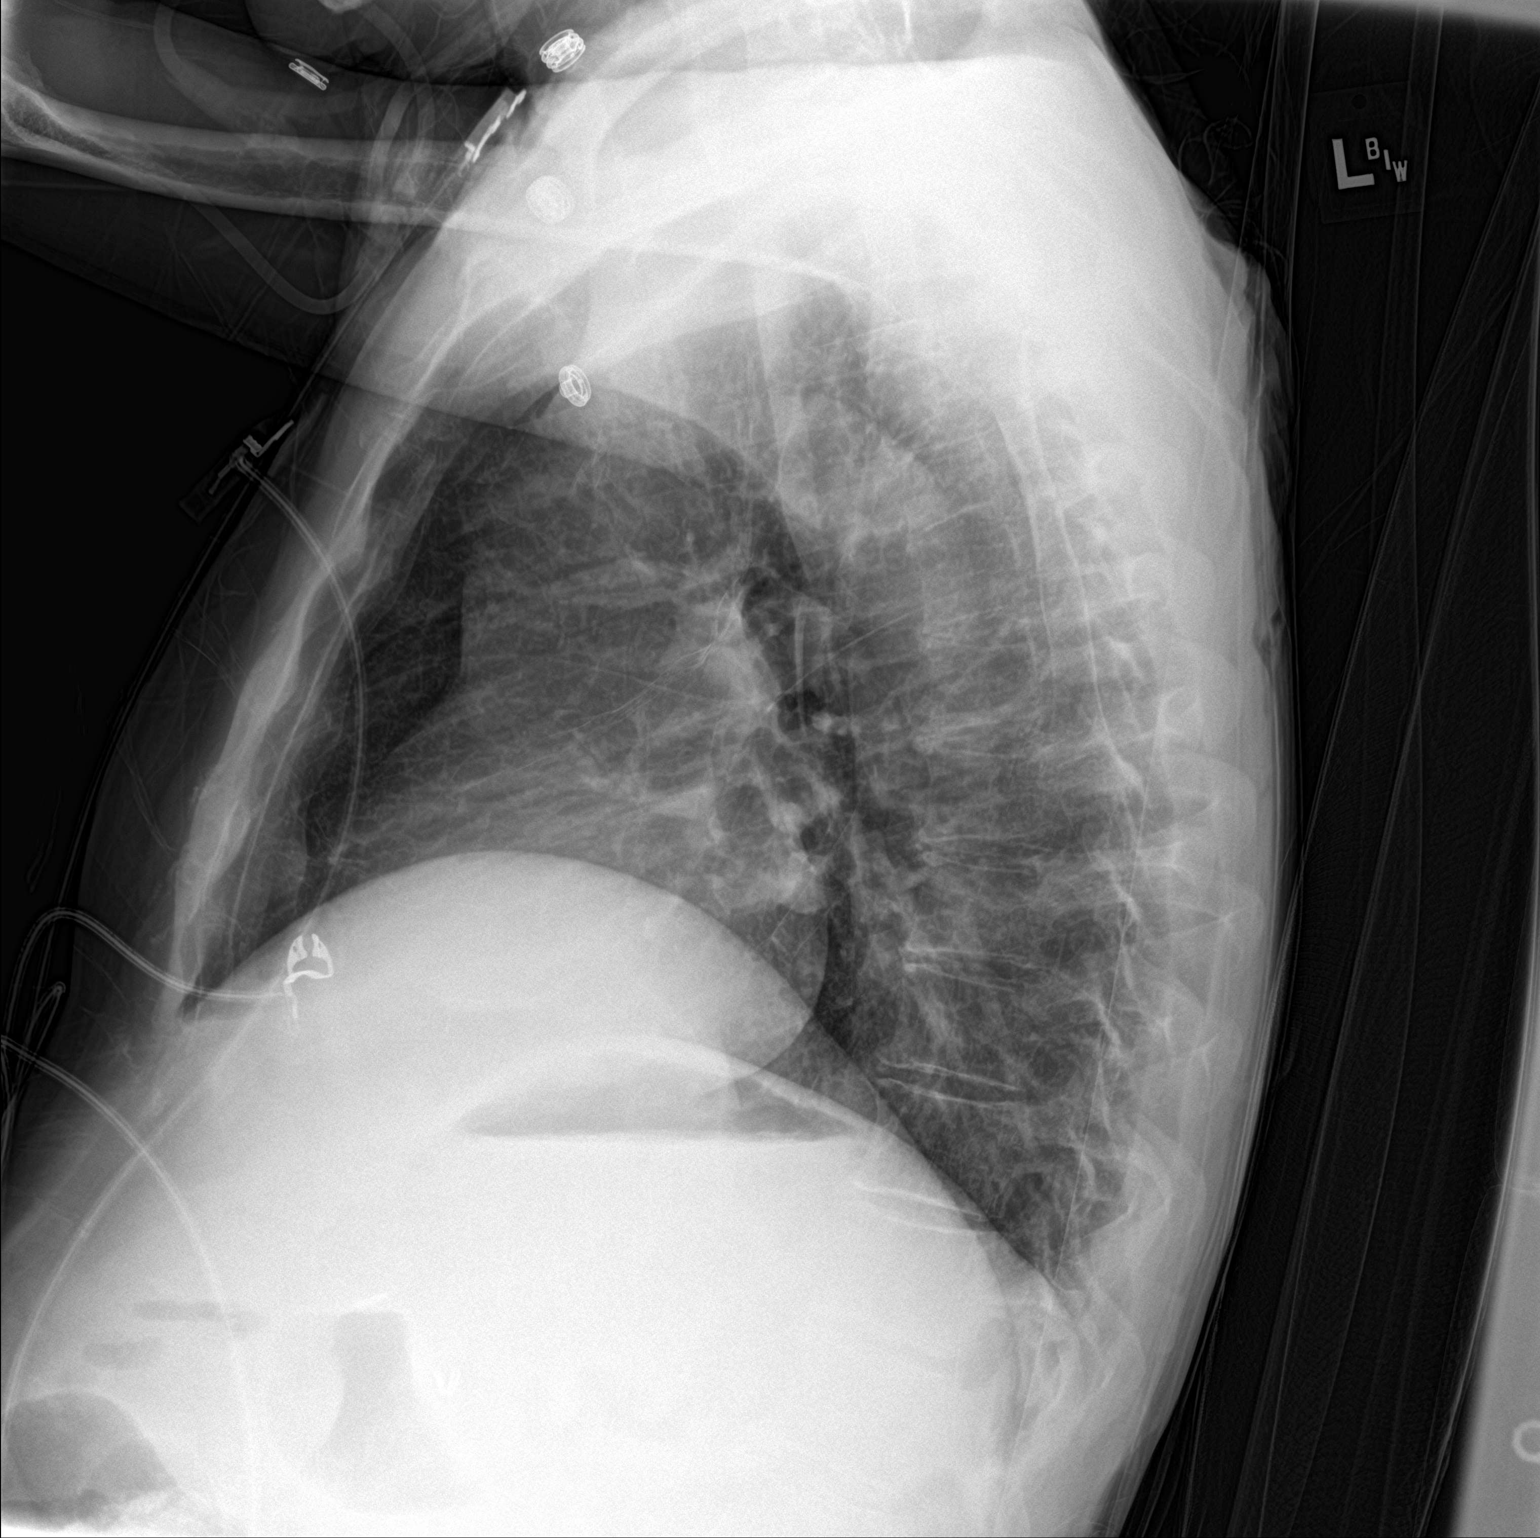

[chest ap]
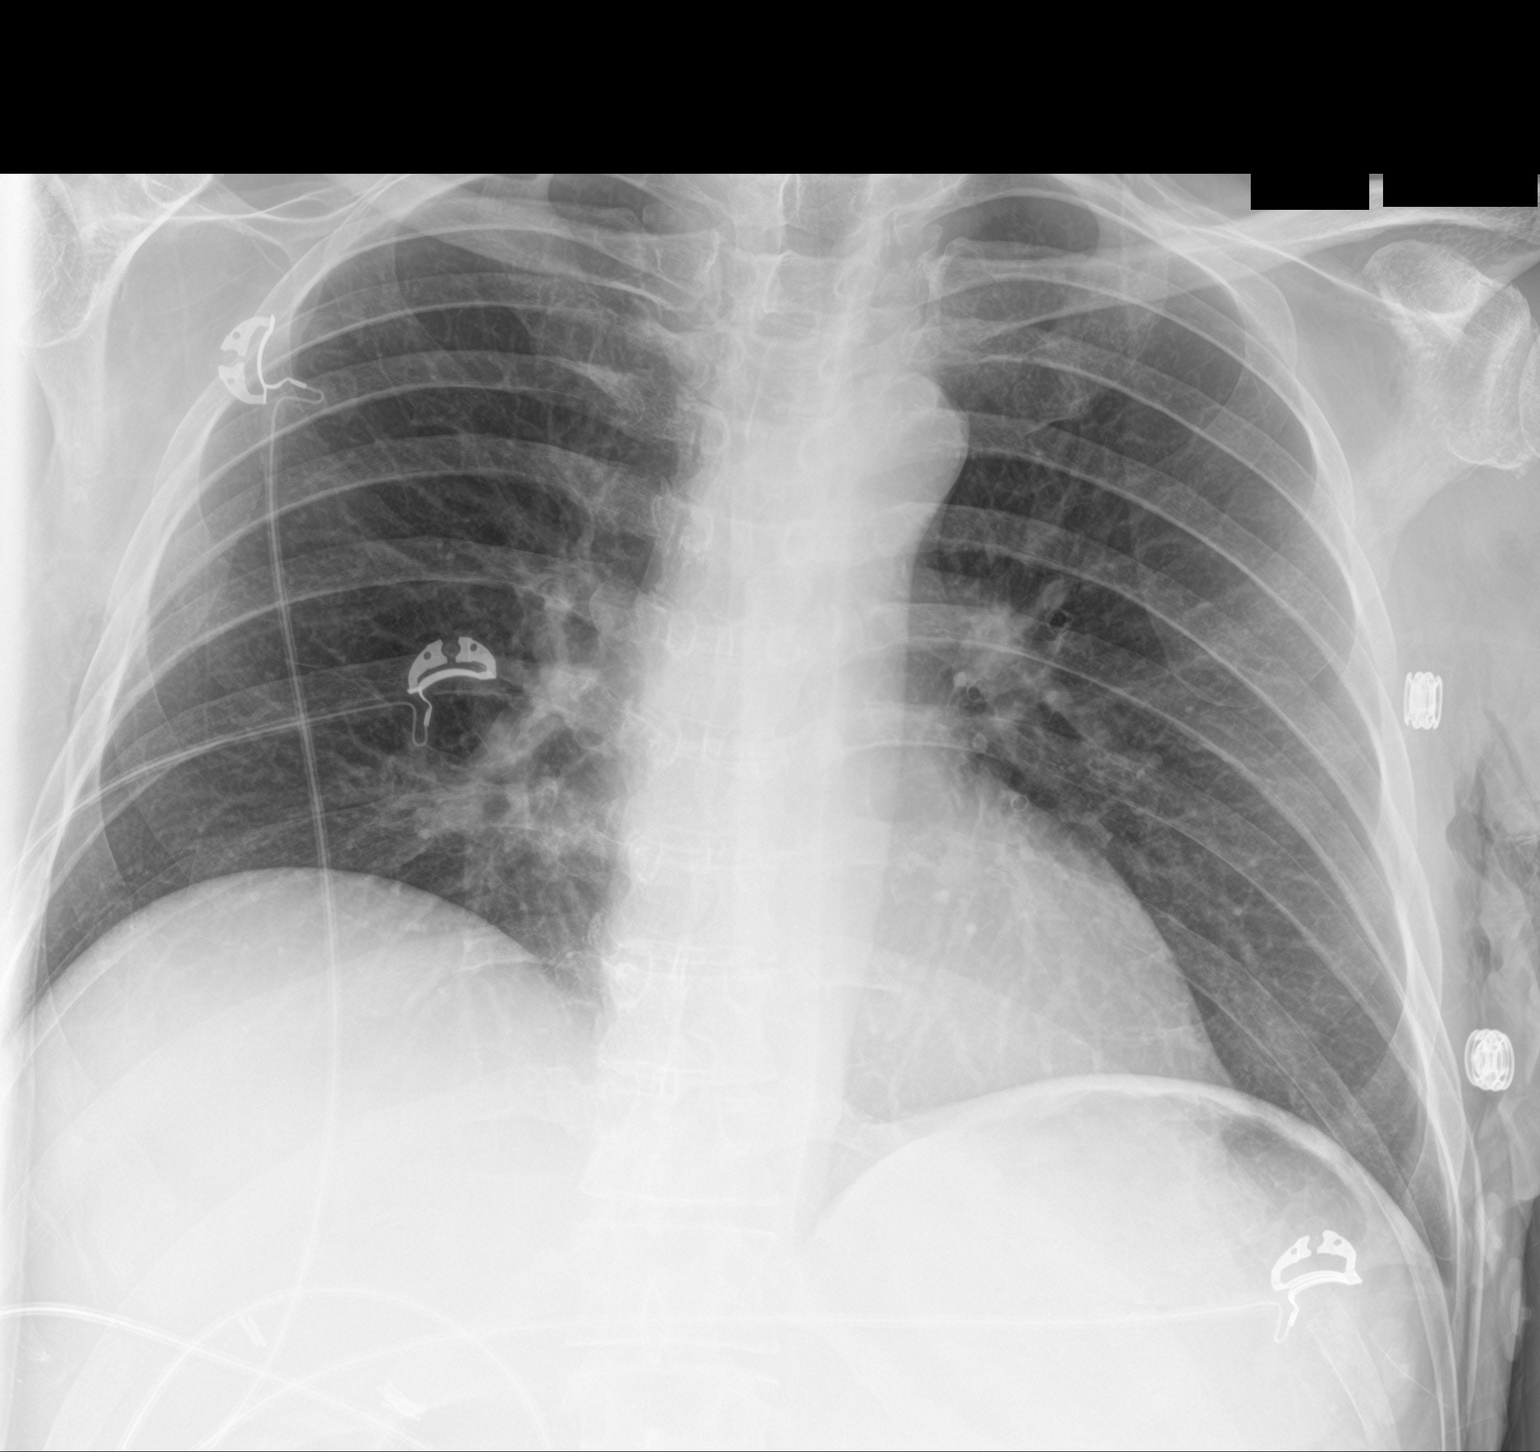

[2 of 2 positions shown; findings below may reference images not displayed]

FINDINGS: Mediastinum hilar structures normal. Cardiomegaly with normal
pulmonary vascularity. Mild right base subsegmental atelectasis. No
pleural effusion or pneumothorax. Stable elevation right
hemidiaphragm.
IMPRESSION: Mild right base subsegmental atelectasis. Stable elevation right
hemidiaphragm.

## 2019-03-25 IMAGING — DX DG CHEST 2V
2 series · 2 of 2 positions shown · non-contrast
Comparison: Chest radiograph dated 01/19/2017

CLINICAL DATA: 46-year-old male with mid chest pain and shortness
of breath.

EXAM:
CHEST  2 VIEW

[chest lat]
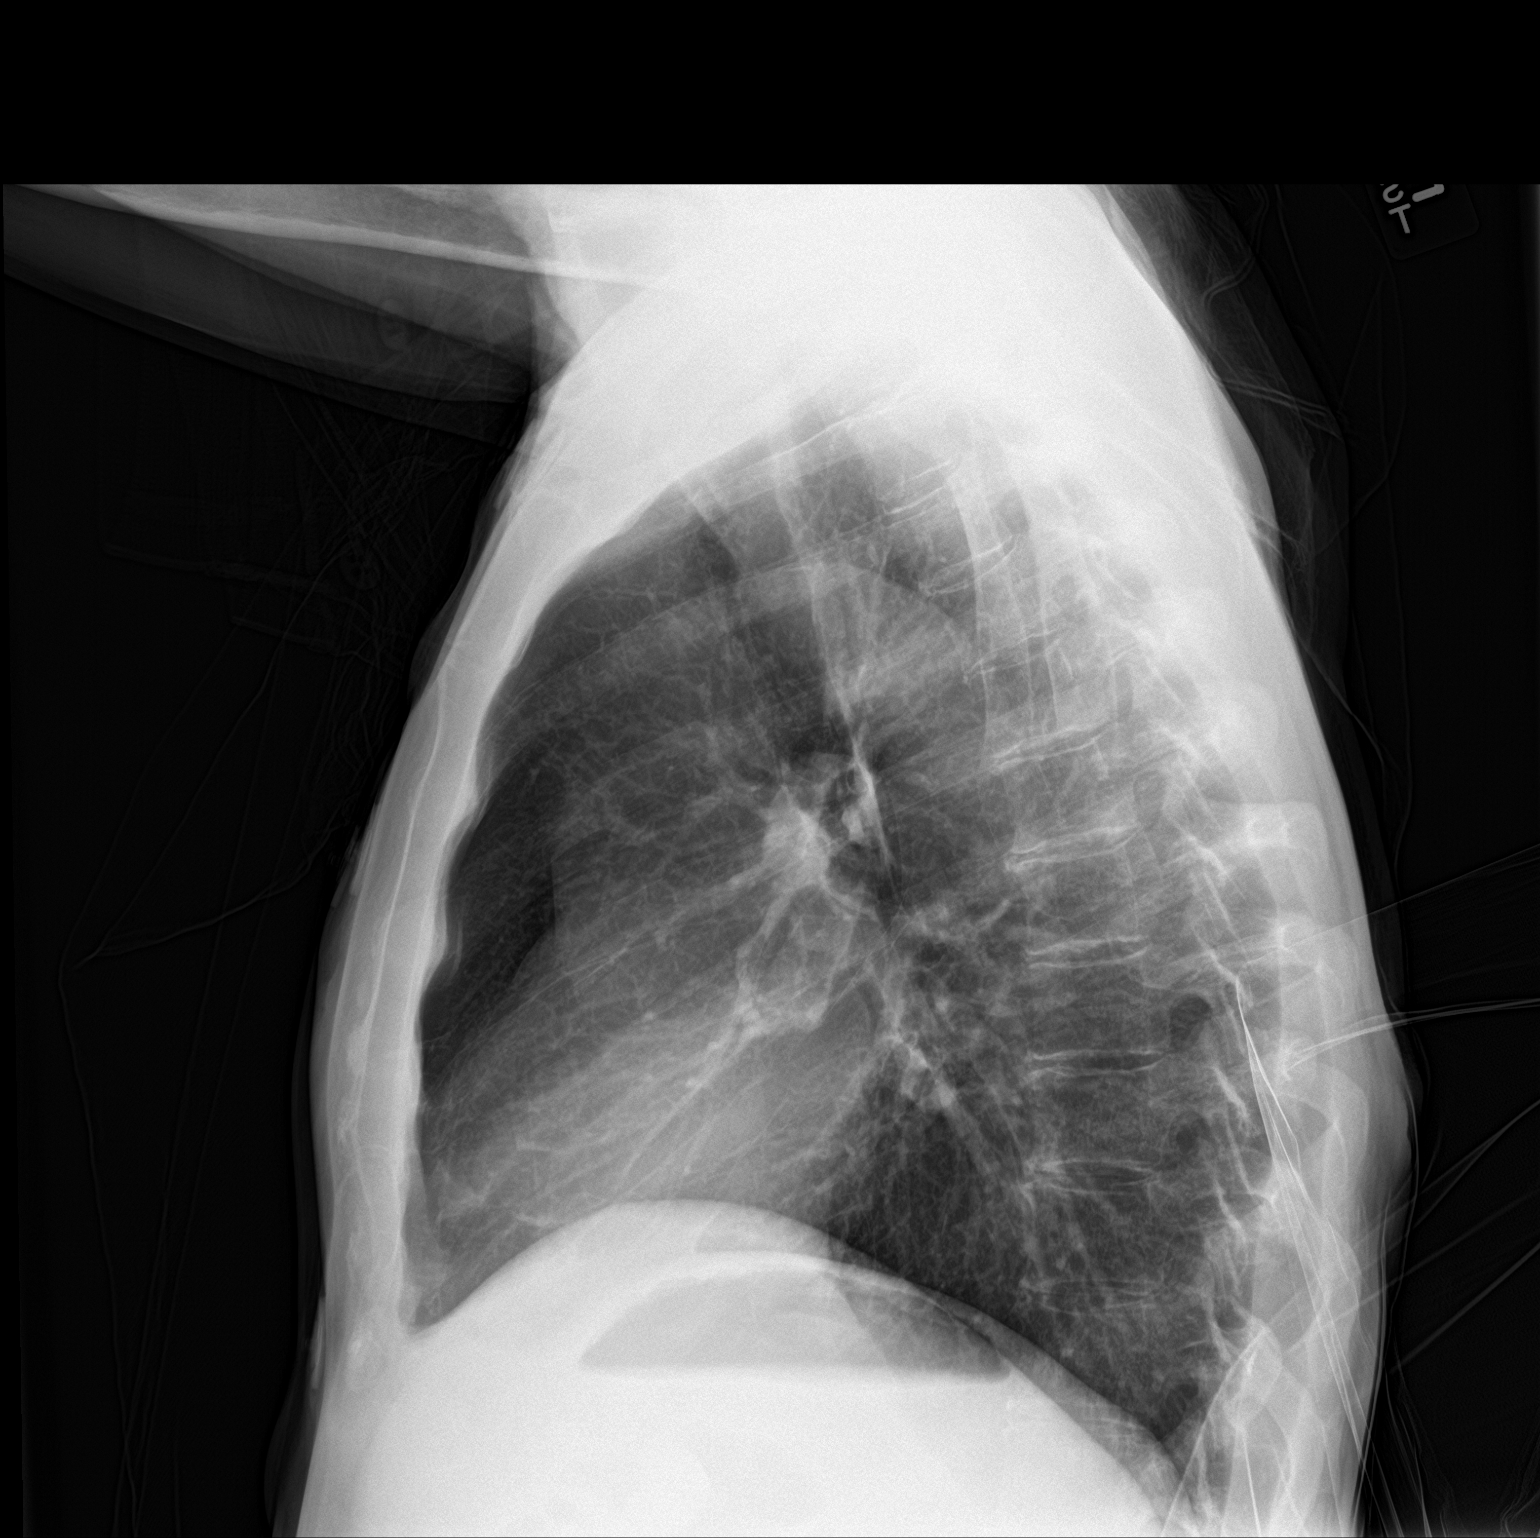

[chest ap]
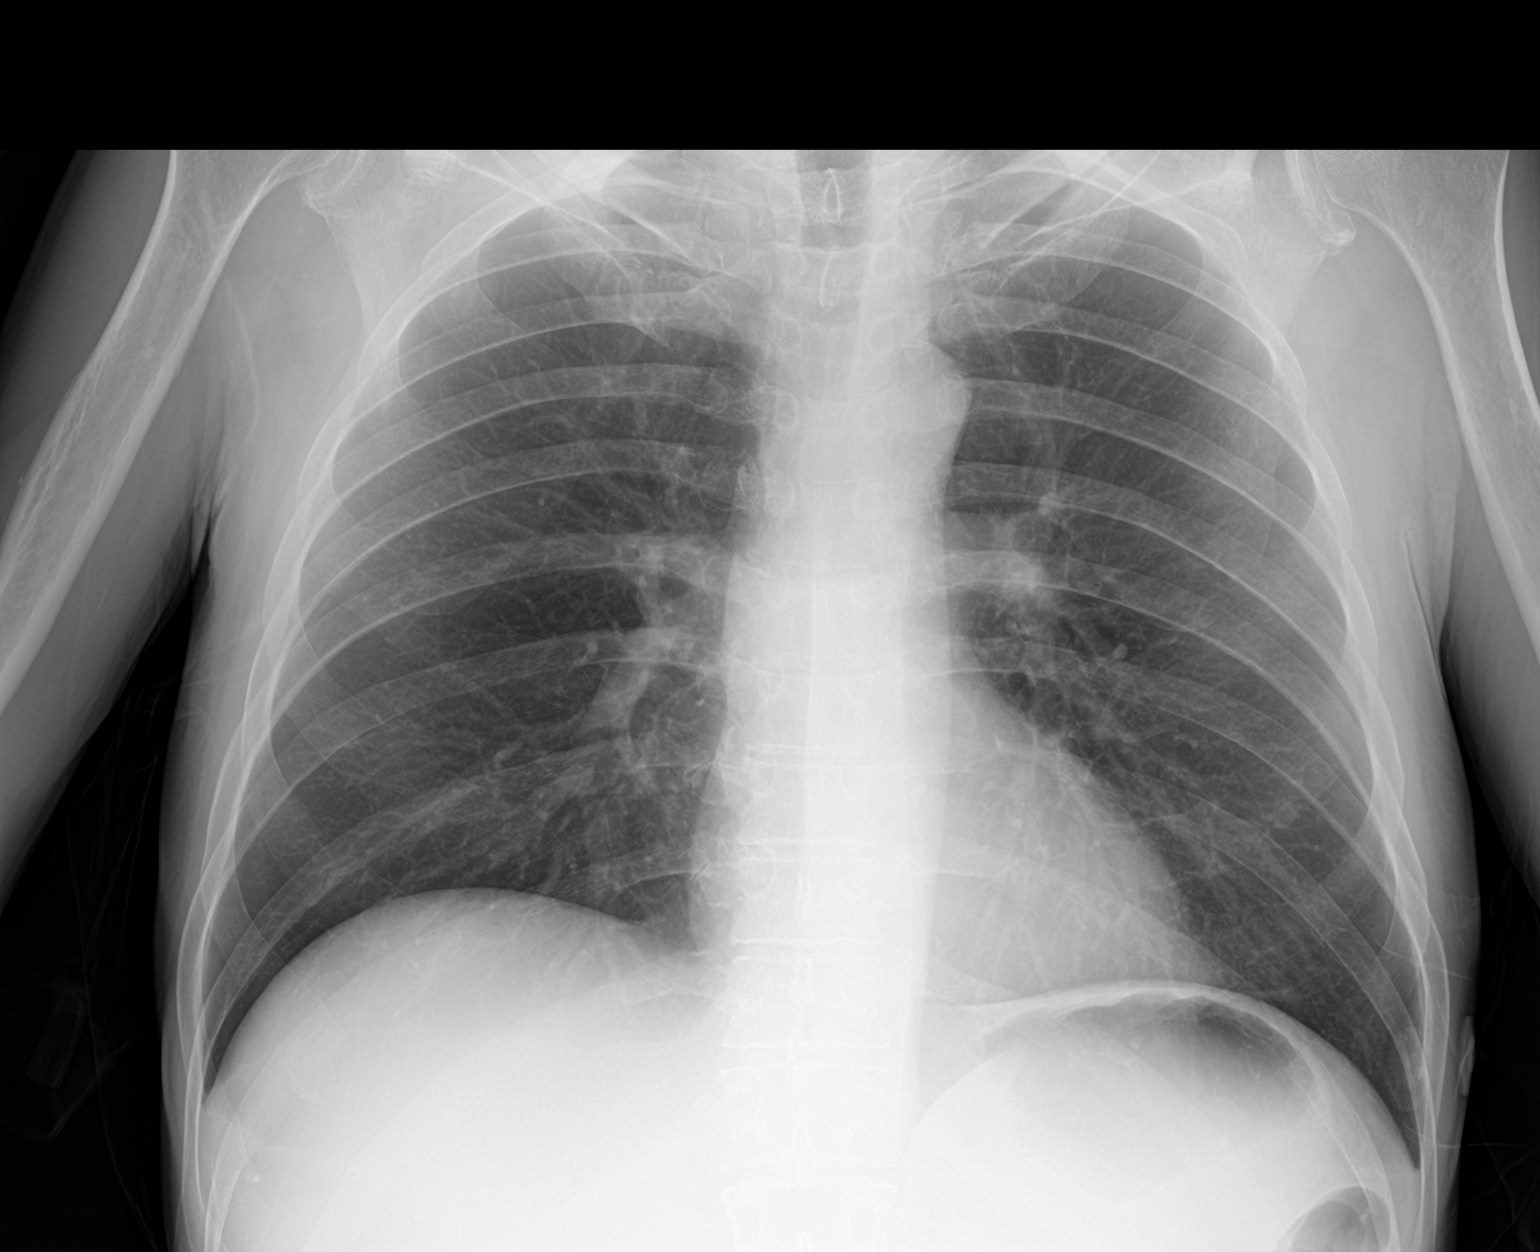

[2 of 2 positions shown; findings below may reference images not displayed]

FINDINGS: The lungs are clear. There is no pleural effusion or pneumothorax.
The cardiac silhouette is within normal limits. No acute osseous
pathology.
IMPRESSION: No active cardiopulmonary disease.
